# Patient Record
Sex: Female | Born: 1945
Health system: Southern US, Community
[De-identification: ages and names within clinical notes are randomized; demographics above are authoritative.]

## PROBLEM LIST (undated history)

## (undated) DIAGNOSIS — R519 Headache, unspecified: Secondary | ICD-10-CM

## (undated) DIAGNOSIS — I10 Essential (primary) hypertension: Secondary | ICD-10-CM

## (undated) DIAGNOSIS — I8390 Asymptomatic varicose veins of unspecified lower extremity: Secondary | ICD-10-CM

## (undated) DIAGNOSIS — R112 Nausea with vomiting, unspecified: Secondary | ICD-10-CM

## (undated) DIAGNOSIS — R51 Headache: Secondary | ICD-10-CM

## (undated) DIAGNOSIS — R011 Cardiac murmur, unspecified: Secondary | ICD-10-CM

## (undated) DIAGNOSIS — E785 Hyperlipidemia, unspecified: Secondary | ICD-10-CM

## (undated) DIAGNOSIS — Z9889 Other specified postprocedural states: Secondary | ICD-10-CM

## (undated) DIAGNOSIS — K219 Gastro-esophageal reflux disease without esophagitis: Secondary | ICD-10-CM

## (undated) DIAGNOSIS — I739 Peripheral vascular disease, unspecified: Secondary | ICD-10-CM

## (undated) DIAGNOSIS — D649 Anemia, unspecified: Secondary | ICD-10-CM

## (undated) DIAGNOSIS — I350 Nonrheumatic aortic (valve) stenosis: Secondary | ICD-10-CM

## (undated) HISTORY — PX: ADENOIDECTOMY: SUR15

## (undated) HISTORY — PX: TUBAL LIGATION: SHX77

## (undated) HISTORY — DX: Anemia, unspecified: D64.9

## (undated) HISTORY — PX: TONSILLECTOMY: SUR1361

## (undated) HISTORY — PX: CARPAL TUNNEL RELEASE: SHX101

## (undated) HISTORY — PX: COLONOSCOPY: SHX174

## (undated) HISTORY — DX: Asymptomatic varicose veins of unspecified lower extremity: I83.90

## (undated) HISTORY — PX: TARSAL TUNNEL RELEASE: SUR1099

## (undated) HISTORY — PX: COLON RESECTION: SHX5231

---

## 1999-05-26 ENCOUNTER — Emergency Department (HOSPITAL_COMMUNITY): Admission: EM | Admit: 1999-05-26 | Discharge: 1999-05-26 | Payer: Self-pay | Admitting: Emergency Medicine

## 2000-10-28 ENCOUNTER — Encounter: Admission: RE | Admit: 2000-10-28 | Discharge: 2000-10-28 | Payer: Self-pay | Admitting: Family Medicine

## 2000-10-28 ENCOUNTER — Encounter: Payer: Self-pay | Admitting: Family Medicine

## 2002-10-24 ENCOUNTER — Encounter: Payer: Self-pay | Admitting: *Deleted

## 2002-10-26 ENCOUNTER — Ambulatory Visit (HOSPITAL_COMMUNITY): Admission: RE | Admit: 2002-10-26 | Discharge: 2002-10-26 | Payer: Self-pay | Admitting: *Deleted

## 2003-02-16 HISTORY — PX: FEMORAL BYPASS: SHX50

## 2003-03-20 ENCOUNTER — Encounter
Admission: RE | Admit: 2003-03-20 | Discharge: 2003-06-18 | Payer: Self-pay | Admitting: Physical Medicine & Rehabilitation

## 2003-07-25 ENCOUNTER — Ambulatory Visit (HOSPITAL_COMMUNITY): Admission: RE | Admit: 2003-07-25 | Discharge: 2003-07-25 | Payer: Self-pay | Admitting: Orthopedic Surgery

## 2003-07-25 ENCOUNTER — Ambulatory Visit (HOSPITAL_BASED_OUTPATIENT_CLINIC_OR_DEPARTMENT_OTHER): Admission: RE | Admit: 2003-07-25 | Discharge: 2003-07-25 | Payer: Self-pay | Admitting: Orthopedic Surgery

## 2005-02-15 HISTORY — PX: OTHER SURGICAL HISTORY: SHX169

## 2005-02-19 ENCOUNTER — Ambulatory Visit (HOSPITAL_COMMUNITY): Admission: RE | Admit: 2005-02-19 | Discharge: 2005-02-19 | Payer: Self-pay | Admitting: *Deleted

## 2005-03-03 ENCOUNTER — Inpatient Hospital Stay (HOSPITAL_COMMUNITY): Admission: RE | Admit: 2005-03-03 | Discharge: 2005-03-08 | Payer: Self-pay | Admitting: *Deleted

## 2005-04-02 ENCOUNTER — Inpatient Hospital Stay (HOSPITAL_COMMUNITY): Admission: AD | Admit: 2005-04-02 | Discharge: 2005-04-07 | Payer: Self-pay | Admitting: *Deleted

## 2005-06-15 HISTORY — PX: OTHER SURGICAL HISTORY: SHX169

## 2005-06-25 ENCOUNTER — Ambulatory Visit (HOSPITAL_COMMUNITY): Admission: RE | Admit: 2005-06-25 | Discharge: 2005-06-25 | Payer: Self-pay | Admitting: *Deleted

## 2005-06-30 ENCOUNTER — Encounter: Admission: RE | Admit: 2005-06-30 | Discharge: 2005-06-30 | Payer: Self-pay | Admitting: Internal Medicine

## 2005-07-07 ENCOUNTER — Encounter: Payer: Self-pay | Admitting: Vascular Surgery

## 2005-07-07 ENCOUNTER — Inpatient Hospital Stay (HOSPITAL_COMMUNITY): Admission: RE | Admit: 2005-07-07 | Discharge: 2005-07-11 | Payer: Self-pay | Admitting: *Deleted

## 2005-07-09 ENCOUNTER — Encounter (INDEPENDENT_AMBULATORY_CARE_PROVIDER_SITE_OTHER): Payer: Self-pay | Admitting: *Deleted

## 2005-08-23 ENCOUNTER — Ambulatory Visit (HOSPITAL_COMMUNITY): Admission: RE | Admit: 2005-08-23 | Discharge: 2005-08-23 | Payer: Self-pay | Admitting: *Deleted

## 2005-11-15 ENCOUNTER — Emergency Department (HOSPITAL_COMMUNITY): Admission: EM | Admit: 2005-11-15 | Discharge: 2005-11-15 | Payer: Self-pay | Admitting: Family Medicine

## 2005-11-19 ENCOUNTER — Inpatient Hospital Stay (HOSPITAL_COMMUNITY): Admission: AD | Admit: 2005-11-19 | Discharge: 2005-11-27 | Payer: Self-pay | Admitting: Vascular Surgery

## 2005-11-22 ENCOUNTER — Encounter: Payer: Self-pay | Admitting: Vascular Surgery

## 2005-12-31 ENCOUNTER — Encounter: Payer: Self-pay | Admitting: Vascular Surgery

## 2005-12-31 ENCOUNTER — Ambulatory Visit (HOSPITAL_COMMUNITY): Admission: RE | Admit: 2005-12-31 | Discharge: 2005-12-31 | Payer: Self-pay | Admitting: *Deleted

## 2006-04-15 ENCOUNTER — Ambulatory Visit: Payer: Self-pay | Admitting: *Deleted

## 2006-04-15 ENCOUNTER — Ambulatory Visit (HOSPITAL_COMMUNITY): Admission: RE | Admit: 2006-04-15 | Discharge: 2006-04-15 | Payer: Self-pay | Admitting: *Deleted

## 2006-05-20 ENCOUNTER — Ambulatory Visit (HOSPITAL_COMMUNITY): Admission: RE | Admit: 2006-05-20 | Discharge: 2006-05-20 | Payer: Self-pay | Admitting: *Deleted

## 2006-07-29 ENCOUNTER — Ambulatory Visit: Payer: Self-pay | Admitting: Vascular Surgery

## 2006-11-04 ENCOUNTER — Ambulatory Visit (HOSPITAL_COMMUNITY): Admission: RE | Admit: 2006-11-04 | Discharge: 2006-11-04 | Payer: Self-pay | Admitting: *Deleted

## 2006-11-04 ENCOUNTER — Ambulatory Visit: Payer: Self-pay | Admitting: *Deleted

## 2006-12-08 ENCOUNTER — Ambulatory Visit: Payer: Self-pay | Admitting: *Deleted

## 2007-03-17 ENCOUNTER — Ambulatory Visit: Payer: Self-pay | Admitting: *Deleted

## 2007-03-17 ENCOUNTER — Ambulatory Visit (HOSPITAL_COMMUNITY): Admission: RE | Admit: 2007-03-17 | Discharge: 2007-03-17 | Payer: Self-pay

## 2007-04-13 ENCOUNTER — Ambulatory Visit: Payer: Self-pay | Admitting: *Deleted

## 2007-07-13 ENCOUNTER — Ambulatory Visit: Payer: Self-pay | Admitting: *Deleted

## 2007-09-06 ENCOUNTER — Ambulatory Visit: Payer: Self-pay | Admitting: *Deleted

## 2007-09-06 ENCOUNTER — Ambulatory Visit (HOSPITAL_COMMUNITY): Admission: RE | Admit: 2007-09-06 | Discharge: 2007-09-07 | Payer: Self-pay | Admitting: *Deleted

## 2007-11-16 ENCOUNTER — Ambulatory Visit: Payer: Self-pay | Admitting: *Deleted

## 2008-02-16 HISTORY — PX: OTHER SURGICAL HISTORY: SHX169

## 2008-02-28 ENCOUNTER — Ambulatory Visit: Payer: Self-pay | Admitting: *Deleted

## 2008-02-28 ENCOUNTER — Ambulatory Visit (HOSPITAL_COMMUNITY): Admission: RE | Admit: 2008-02-28 | Discharge: 2008-02-28 | Payer: Self-pay | Admitting: *Deleted

## 2008-03-21 ENCOUNTER — Ambulatory Visit: Payer: Self-pay | Admitting: *Deleted

## 2008-04-18 ENCOUNTER — Emergency Department (HOSPITAL_COMMUNITY): Admission: EM | Admit: 2008-04-18 | Discharge: 2008-04-18 | Payer: Self-pay | Admitting: Family Medicine

## 2008-05-13 ENCOUNTER — Inpatient Hospital Stay (HOSPITAL_COMMUNITY): Admission: EM | Admit: 2008-05-13 | Discharge: 2008-05-18 | Payer: Self-pay | Admitting: Emergency Medicine

## 2008-05-18 ENCOUNTER — Encounter (INDEPENDENT_AMBULATORY_CARE_PROVIDER_SITE_OTHER): Payer: Self-pay | Admitting: Surgery

## 2008-05-18 ENCOUNTER — Ambulatory Visit: Payer: Self-pay | Admitting: Vascular Surgery

## 2008-05-22 ENCOUNTER — Ambulatory Visit (HOSPITAL_COMMUNITY): Admission: RE | Admit: 2008-05-22 | Discharge: 2008-05-22 | Payer: Self-pay | Admitting: General Surgery

## 2008-05-23 ENCOUNTER — Ambulatory Visit (HOSPITAL_COMMUNITY): Admission: RE | Admit: 2008-05-23 | Discharge: 2008-05-23 | Payer: Self-pay | Admitting: General Surgery

## 2008-05-30 ENCOUNTER — Ambulatory Visit (HOSPITAL_COMMUNITY): Admission: RE | Admit: 2008-05-30 | Discharge: 2008-05-30 | Payer: Self-pay | Admitting: General Surgery

## 2008-06-10 ENCOUNTER — Ambulatory Visit (HOSPITAL_COMMUNITY): Admission: RE | Admit: 2008-06-10 | Discharge: 2008-06-10 | Payer: Self-pay | Admitting: General Surgery

## 2008-06-18 ENCOUNTER — Ambulatory Visit (HOSPITAL_COMMUNITY): Admission: RE | Admit: 2008-06-18 | Discharge: 2008-06-18 | Payer: Self-pay | Admitting: General Surgery

## 2008-06-26 ENCOUNTER — Ambulatory Visit (HOSPITAL_COMMUNITY): Admission: RE | Admit: 2008-06-26 | Discharge: 2008-06-26 | Payer: Self-pay | Admitting: General Surgery

## 2008-06-28 ENCOUNTER — Ambulatory Visit (HOSPITAL_COMMUNITY): Admission: RE | Admit: 2008-06-28 | Discharge: 2008-06-28 | Payer: Self-pay | Admitting: General Surgery

## 2008-07-17 ENCOUNTER — Inpatient Hospital Stay (HOSPITAL_COMMUNITY): Admission: EM | Admit: 2008-07-17 | Discharge: 2008-07-25 | Payer: Self-pay | Admitting: Emergency Medicine

## 2008-08-01 ENCOUNTER — Ambulatory Visit: Payer: Self-pay | Admitting: *Deleted

## 2008-08-07 ENCOUNTER — Ambulatory Visit: Payer: Self-pay | Admitting: *Deleted

## 2008-08-07 ENCOUNTER — Inpatient Hospital Stay (HOSPITAL_COMMUNITY): Admission: RE | Admit: 2008-08-07 | Discharge: 2008-08-16 | Payer: Self-pay | Admitting: *Deleted

## 2008-08-12 ENCOUNTER — Encounter (INDEPENDENT_AMBULATORY_CARE_PROVIDER_SITE_OTHER): Payer: Self-pay | Admitting: *Deleted

## 2008-08-23 ENCOUNTER — Ambulatory Visit (HOSPITAL_COMMUNITY): Admission: RE | Admit: 2008-08-23 | Discharge: 2008-08-23 | Payer: Self-pay | Admitting: Surgery

## 2008-08-29 ENCOUNTER — Ambulatory Visit: Payer: Self-pay | Admitting: *Deleted

## 2008-09-05 ENCOUNTER — Ambulatory Visit: Payer: Self-pay | Admitting: *Deleted

## 2008-09-17 ENCOUNTER — Inpatient Hospital Stay (HOSPITAL_COMMUNITY): Admission: RE | Admit: 2008-09-17 | Discharge: 2008-09-23 | Payer: Self-pay | Admitting: General Surgery

## 2008-09-17 ENCOUNTER — Encounter (INDEPENDENT_AMBULATORY_CARE_PROVIDER_SITE_OTHER): Payer: Self-pay | Admitting: General Surgery

## 2008-10-14 ENCOUNTER — Ambulatory Visit: Payer: Self-pay | Admitting: Surgery

## 2008-11-11 ENCOUNTER — Ambulatory Visit: Payer: Self-pay | Admitting: Surgery

## 2008-12-20 ENCOUNTER — Ambulatory Visit: Payer: Self-pay | Admitting: Vascular Surgery

## 2009-06-23 ENCOUNTER — Ambulatory Visit: Payer: Self-pay | Admitting: Surgery

## 2009-12-24 ENCOUNTER — Ambulatory Visit: Payer: Self-pay | Admitting: Surgery

## 2009-12-27 IMAGING — CT CT PELVIS W/ CM
2 of 5 series · 16 of 46 positions shown, 18 images · IV contrast (agent unspecified)
Comparison: 05/22/2008

CT ABDOMEN

CLINICAL DATA: Status post diverticulitis.  Follow up abscess

CT ABDOMEN AND PELVIS WITH CONTRAST
TECHNIQUE: Multidetector CT imaging of the abdomen and pelvis was
performed using the standard protocol following bolus
administration of intravenous contrast.
Contrast: 180 ml of omni 300

[Series 2: routine abdomen · axial · 0.89mm/px · z∈[-466,-66]mm · 13 of 90 slices shown, 15 images]
[im 5/90  soft-tissue]
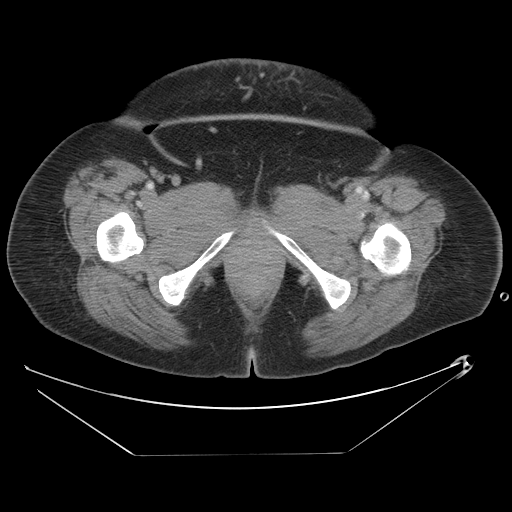
[im 5/90  bone]
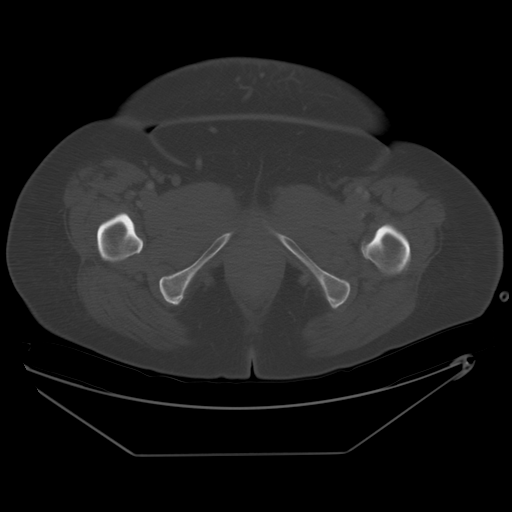
[im 14/90  soft-tissue]
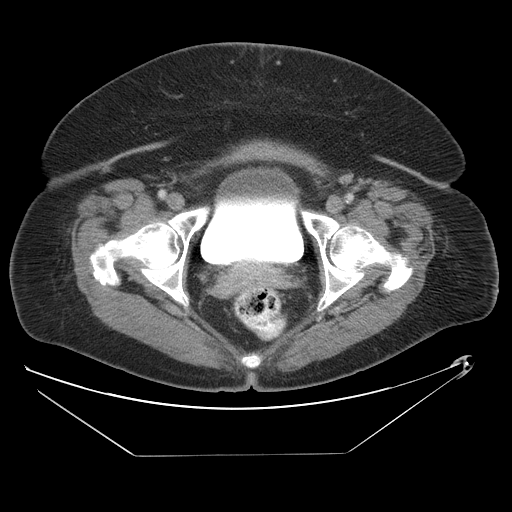
[im 18/90  soft-tissue]
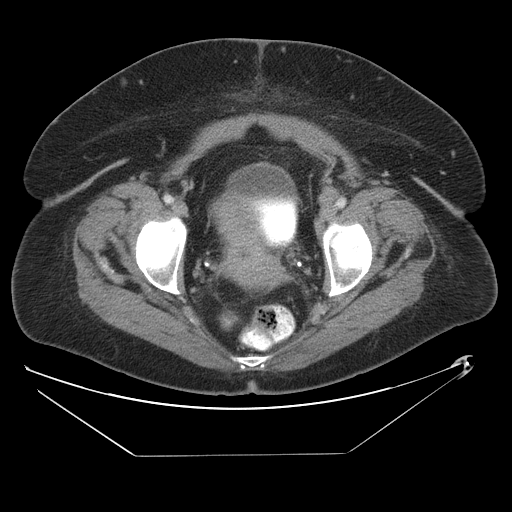
[im 27/90  soft-tissue]
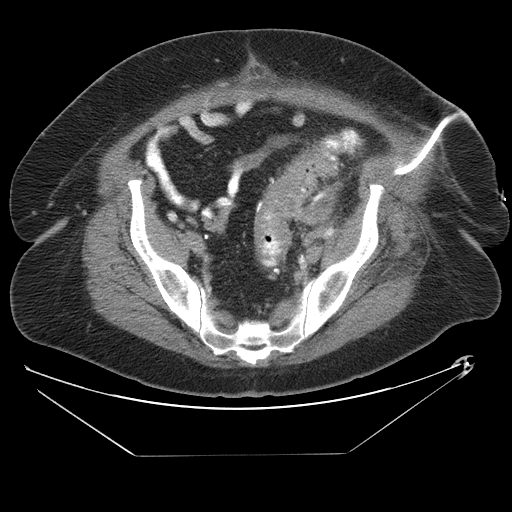
[im 32/90  soft-tissue]
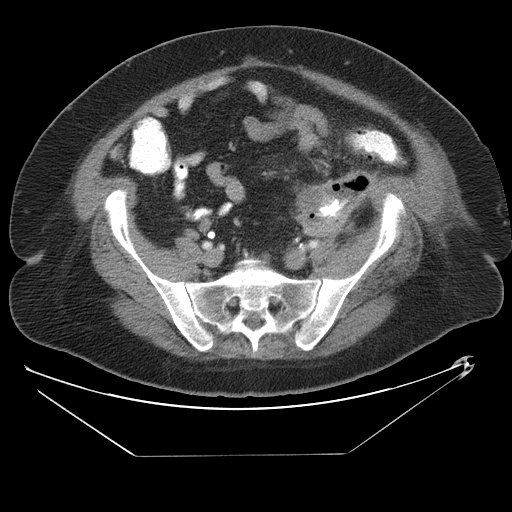
[im 41/90  soft-tissue]
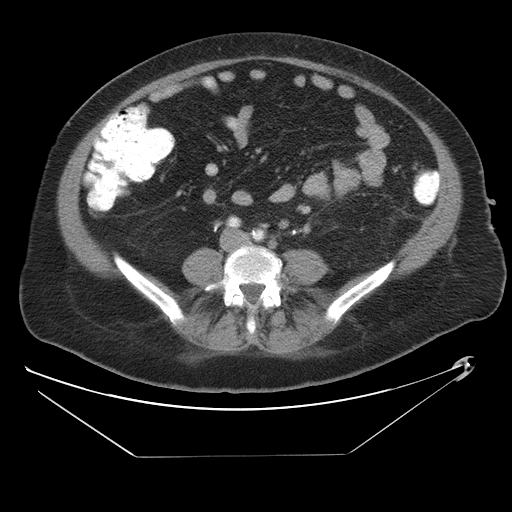
[im 45/90  soft-tissue]
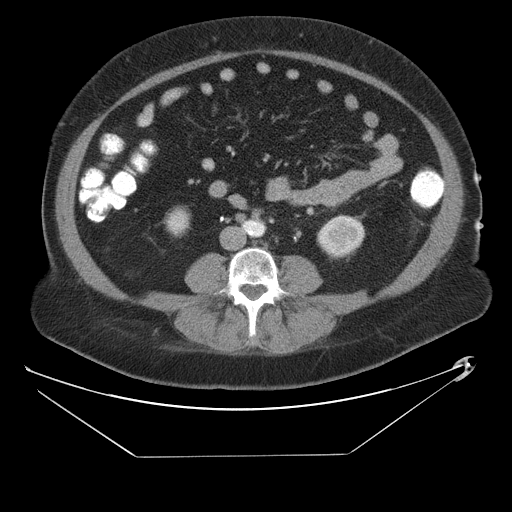
[im 49/90  soft-tissue]
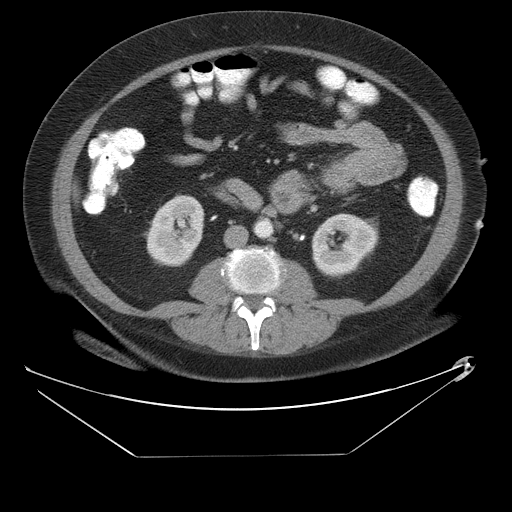
[im 58/90  soft-tissue]
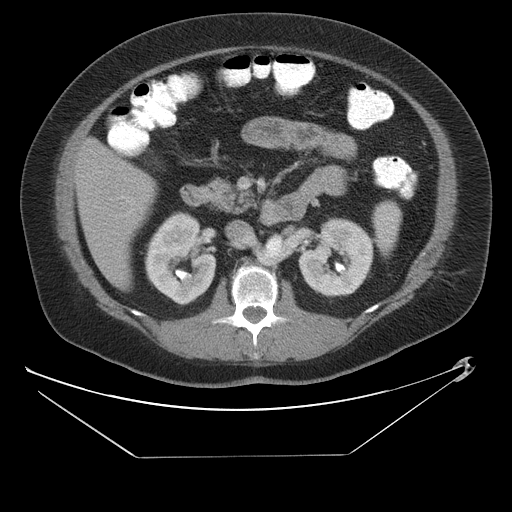
[im 58/90  bone]
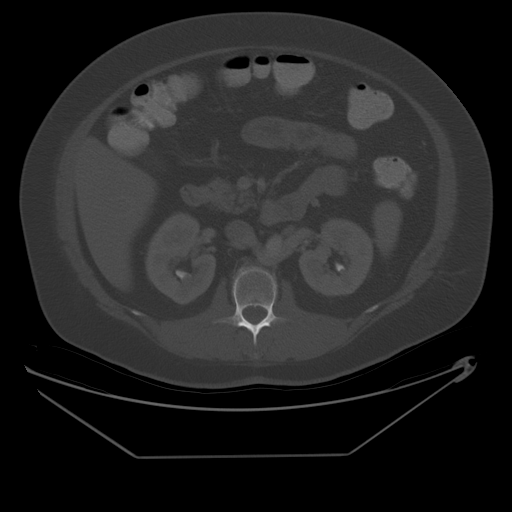
[im 63/90  soft-tissue]
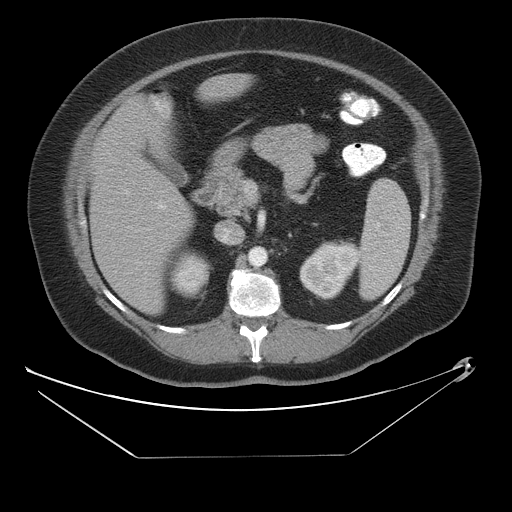
[im 72/90  soft-tissue]
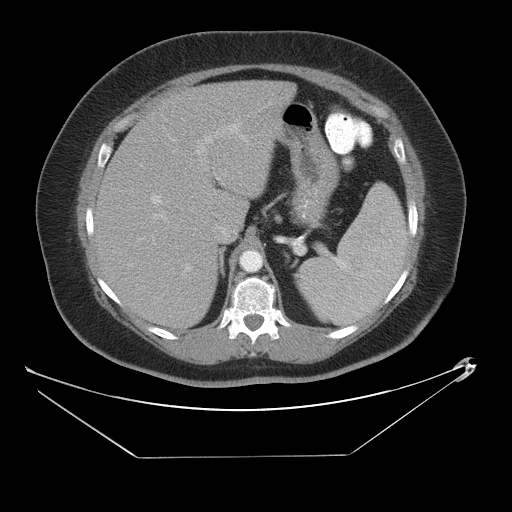
[im 76/90  soft-tissue]
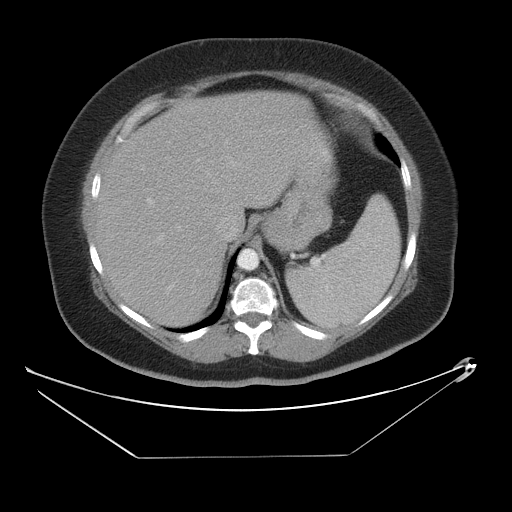
[im 85/90  soft-tissue]
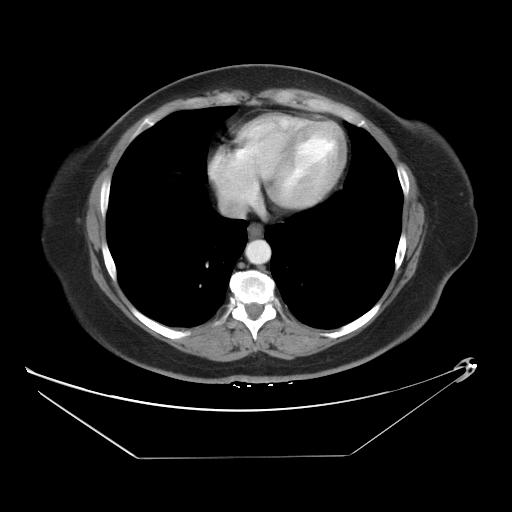

[Series 401: coronals · coronal · 0.89mm/px · 3 of 107 slices shown]
[im 36/107  soft-tissue]
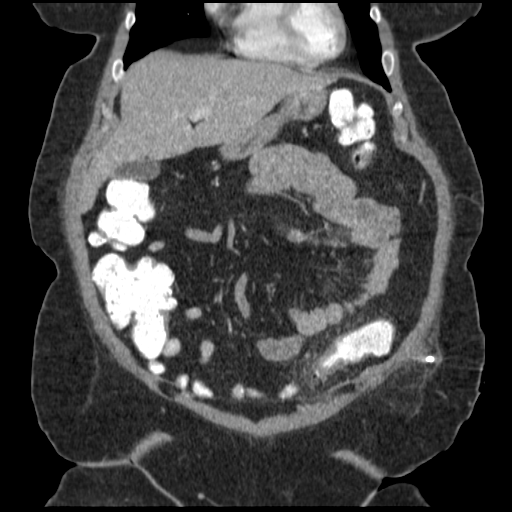
[im 48/107  soft-tissue]
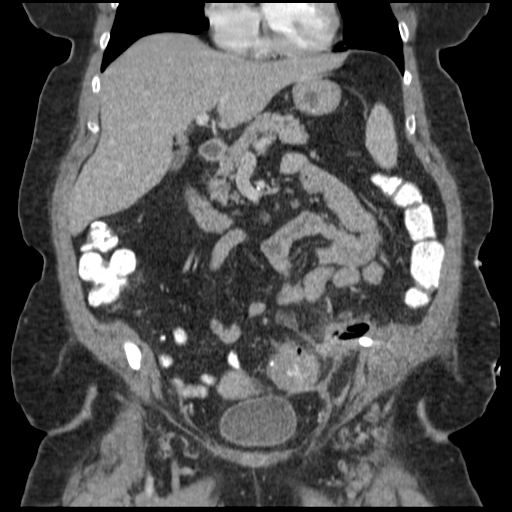
[im 59/107  soft-tissue]
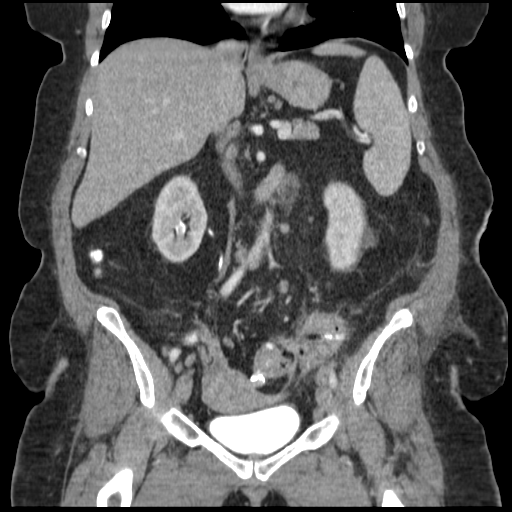

[16 of 46 positions shown; findings below may reference images not displayed]

FINDINGS: The lung bases are clear.

The adrenal glands are negative.

The spleen is negative.

Gallbladder is negative.  There is no biliary dilatation.

The pancreas is normal.

There is mild fatty infiltration of the liver.

No focal liver abnormalities noted.

The right kidney is normal.  The left kidney is normal.  There is a
retroaortic left renal vein.

Scattered borderline enlarged retroperitoneal lymph nodes are
noted.  In the setting of abscess and acute diverticulitis these
may be reactive.  For example periaortic lymph node on image number
44 measures 1 cm.

There are no free fluid or abnormal fluid collections within the
upper abdomen.

There are no specific features to suggest bowel obstruction.

 The small bowel loops are normal in their course and in their
caliber.

Review of the visualized osseous structures shows mild multilevel
spondylosis.
IMPRESSION: 1.  No acute upper abdominal CT findings.

2.  Fatty infiltration of the liver.
3.  Borderline enlarged retroperitoneal lymph nodes may be reactive
in etiology.

CT PELVIS
FINDINGS: Diverticular changes and acute diverticulitis is again
noted involving the sigmoid colon.

The abscess within the left iliac fossa is again noted.  The
pigtail drainage catheter has been repositioned and is now in the
central portion of the abscess.  This currently measures 4.8 x
cm.  Previously this measured 6.1 x 7.4   cm.

There are no new fluid collections.

The uterus appears normal.

The urinary bladder is negative.

Borderline enlarged, enlarged common iliac lymph node is again
noted.  This currently measures 1.4 cm, image 48.  This is compared
with the same previously.

There is a left external iliac lymph node which measures 1.6 cm,
image 72.  This is unchanged from previous exam.
IMPRESSION: 1.  Mild decrease in the volume of left iliac fossa diverticular
abscess.
2.  Enlarged iliac lymph nodes may be reactive in etiology.

## 2010-03-08 ENCOUNTER — Encounter: Payer: Self-pay | Admitting: General Surgery

## 2010-03-09 ENCOUNTER — Encounter: Payer: Self-pay | Admitting: General Surgery

## 2010-05-23 LAB — CROSSMATCH
ABO/RH(D): O NEG
Antibody Screen: NEGATIVE

## 2010-05-23 LAB — GLUCOSE, CAPILLARY
Glucose-Capillary: 100 mg/dL — ABNORMAL HIGH (ref 70–99)
Glucose-Capillary: 103 mg/dL — ABNORMAL HIGH (ref 70–99)
Glucose-Capillary: 103 mg/dL — ABNORMAL HIGH (ref 70–99)
Glucose-Capillary: 105 mg/dL — ABNORMAL HIGH (ref 70–99)
Glucose-Capillary: 109 mg/dL — ABNORMAL HIGH (ref 70–99)
Glucose-Capillary: 112 mg/dL — ABNORMAL HIGH (ref 70–99)
Glucose-Capillary: 112 mg/dL — ABNORMAL HIGH (ref 70–99)
Glucose-Capillary: 115 mg/dL — ABNORMAL HIGH (ref 70–99)
Glucose-Capillary: 117 mg/dL — ABNORMAL HIGH (ref 70–99)
Glucose-Capillary: 120 mg/dL — ABNORMAL HIGH (ref 70–99)
Glucose-Capillary: 121 mg/dL — ABNORMAL HIGH (ref 70–99)
Glucose-Capillary: 130 mg/dL — ABNORMAL HIGH (ref 70–99)
Glucose-Capillary: 133 mg/dL — ABNORMAL HIGH (ref 70–99)
Glucose-Capillary: 152 mg/dL — ABNORMAL HIGH (ref 70–99)
Glucose-Capillary: 183 mg/dL — ABNORMAL HIGH (ref 70–99)

## 2010-05-23 LAB — BASIC METABOLIC PANEL
Calcium: 8.4 mg/dL (ref 8.4–10.5)
GFR calc Af Amer: >60 mL/min (ref >60)
GFR calc non Af Amer: >60 mL/min (ref >60)
Potassium: 4.4 mEq/L (ref 3.5–5.1)
Sodium: 140 mEq/L (ref 135–145)

## 2010-05-23 LAB — CBC
HCT: 21.3 % — ABNORMAL LOW (ref 36.0–46.0)
HCT: 27.1 % — ABNORMAL LOW (ref 36.0–46.0)
Hemoglobin: 7 g/dL — CL (ref 12.0–15.0)
Hemoglobin: 8.1 g/dL — ABNORMAL LOW (ref 12.0–15.0)
MCHC: 32.6 g/dL (ref 30.0–36.0)
MCHC: 33 g/dL (ref 30.0–36.0)
MCV: 81.8 fL (ref 78.0–100.0)
MCV: 82.4 fL (ref 78.0–100.0)
MCV: 82.7 fL (ref 78.0–100.0)
Platelets: 133 10*3/uL — ABNORMAL LOW (ref 150–400)
RBC: 2.6 MIL/uL — ABNORMAL LOW (ref 3.87–5.11)
RBC: 2.85 MIL/uL — ABNORMAL LOW (ref 3.87–5.11)
RBC: 2.97 MIL/uL — ABNORMAL LOW (ref 3.87–5.11)
RBC: 3.29 MIL/uL — ABNORMAL LOW (ref 3.87–5.11)
RDW: 15.1 % (ref 11.5–15.5)
RDW: 16.1 % — ABNORMAL HIGH (ref 11.5–15.5)
WBC: 4.3 10*3/uL (ref 4.0–10.5)
WBC: 4.5 10*3/uL (ref 4.0–10.5)
WBC: 4.9 10*3/uL (ref 4.0–10.5)

## 2010-05-23 LAB — SAMPLE TO BLOOD BANK

## 2010-05-23 LAB — ABO/RH: ABO/RH(D): O NEG

## 2010-05-24 LAB — URINALYSIS, ROUTINE W REFLEX MICROSCOPIC
Nitrite: NEGATIVE
Protein, ur: NEGATIVE mg/dL
pH: 5 (ref 5.0–8.0)

## 2010-05-24 LAB — COMPREHENSIVE METABOLIC PANEL
AST: 17 U/L (ref 0–37)
BUN: 17 mg/dL (ref 6–23)
CO2: 30 mEq/L (ref 19–32)
Calcium: 9.1 mg/dL (ref 8.4–10.5)
Chloride: 100 mEq/L (ref 96–112)
Creatinine, Ser: 1.27 mg/dL — ABNORMAL HIGH (ref 0.4–1.2)
GFR calc non Af Amer: 43 mL/min — ABNORMAL LOW (ref >60)
Glucose, Bld: 143 mg/dL — ABNORMAL HIGH (ref 70–99)
Total Bilirubin: 0.6 mg/dL (ref 0.3–1.2)

## 2010-05-24 LAB — CBC
HCT: 31.4 % — ABNORMAL LOW (ref 36.0–46.0)
Hemoglobin: 10.2 g/dL — ABNORMAL LOW (ref 12.0–15.0)
MCHC: 32.5 g/dL (ref 30.0–36.0)
MCV: 81.7 fL (ref 78.0–100.0)
RBC: 3.85 MIL/uL — ABNORMAL LOW (ref 3.87–5.11)
WBC: 4.4 10*3/uL (ref 4.0–10.5)

## 2010-05-24 LAB — DIFFERENTIAL
Basophils Absolute: 0 10*3/uL (ref 0.0–0.1)
Eosinophils Relative: 2 % (ref 0–5)
Lymphocytes Relative: 34 % (ref 12–46)
Neutro Abs: 2.5 10*3/uL (ref 1.7–7.7)
Neutrophils Relative %: 57 % (ref 43–77)

## 2010-05-24 LAB — URINE MICROSCOPIC-ADD ON

## 2010-05-24 LAB — GLUCOSE, CAPILLARY
Glucose-Capillary: 107 mg/dL — ABNORMAL HIGH (ref 70–99)
Glucose-Capillary: 117 mg/dL — ABNORMAL HIGH (ref 70–99)
Glucose-Capillary: 122 mg/dL — ABNORMAL HIGH (ref 70–99)
Glucose-Capillary: 99 mg/dL (ref 70–99)

## 2010-05-25 LAB — CBC
HCT: 24.5 % — ABNORMAL LOW (ref 36.0–46.0)
HCT: 26.8 % — ABNORMAL LOW (ref 36.0–46.0)
HCT: 29.3 % — ABNORMAL LOW (ref 36.0–46.0)
HCT: 30.6 % — ABNORMAL LOW (ref 36.0–46.0)
HCT: 30.6 % — ABNORMAL LOW (ref 36.0–46.0)
HCT: 26.6 % — ABNORMAL LOW (ref 36.0–46.0)
HCT: 27 % — ABNORMAL LOW (ref 36.0–46.0)
HCT: 29.4 % — ABNORMAL LOW (ref 36.0–46.0)
Hemoglobin: 8.1 g/dL — ABNORMAL LOW (ref 12.0–15.0)
Hemoglobin: 8.7 g/dL — ABNORMAL LOW (ref 12.0–15.0)
Hemoglobin: 8.9 g/dL — ABNORMAL LOW (ref 12.0–15.0)
Hemoglobin: 9 g/dL — ABNORMAL LOW (ref 12.0–15.0)
Hemoglobin: 9.8 g/dL — ABNORMAL LOW (ref 12.0–15.0)
MCHC: 32.7 g/dL (ref 30.0–36.0)
MCHC: 33.1 g/dL (ref 30.0–36.0)
MCHC: 33.1 g/dL (ref 30.0–36.0)
MCHC: 33.1 g/dL (ref 30.0–36.0)
MCHC: 32.7 g/dL (ref 30.0–36.0)
MCHC: 33.3 g/dL (ref 30.0–36.0)
MCHC: 33.3 g/dL (ref 30.0–36.0)
MCHC: 33.6 g/dL (ref 30.0–36.0)
MCV: 81.8 fL (ref 78.0–100.0)
MCV: 81.9 fL (ref 78.0–100.0)
MCV: 81.9 fL (ref 78.0–100.0)
MCV: 82.3 fL (ref 78.0–100.0)
MCV: 81.1 fL (ref 78.0–100.0)
MCV: 82 fL (ref 78.0–100.0)
MCV: 82 fL (ref 78.0–100.0)
MCV: 82.3 fL (ref 78.0–100.0)
Platelets: 221 10*3/uL (ref 150–400)
Platelets: 228 10*3/uL (ref 150–400)
Platelets: 249 10*3/uL (ref 150–400)
Platelets: 141 10*3/uL — ABNORMAL LOW (ref 150–400)
Platelets: 159 10*3/uL (ref 150–400)
Platelets: 178 10*3/uL (ref 150–400)
Platelets: 182 10*3/uL (ref 150–400)
RBC: 3.25 MIL/uL — ABNORMAL LOW (ref 3.87–5.11)
RBC: 3.26 MIL/uL — ABNORMAL LOW (ref 3.87–5.11)
RBC: 3.73 MIL/uL — ABNORMAL LOW (ref 3.87–5.11)
RBC: 3.21 MIL/uL — ABNORMAL LOW (ref 3.87–5.11)
RBC: 3.65 MIL/uL — ABNORMAL LOW (ref 3.87–5.11)
RBC: 3.74 MIL/uL — ABNORMAL LOW (ref 3.87–5.11)
RDW: 15.4 % (ref 11.5–15.5)
RDW: 15.6 % — ABNORMAL HIGH (ref 11.5–15.5)
RDW: 16.1 % — ABNORMAL HIGH (ref 11.5–15.5)
RDW: 15.7 % — ABNORMAL HIGH (ref 11.5–15.5)
RDW: 15.8 % — ABNORMAL HIGH (ref 11.5–15.5)
RDW: 15.9 % — ABNORMAL HIGH (ref 11.5–15.5)
RDW: 16.1 % — ABNORMAL HIGH (ref 11.5–15.5)
RDW: 16.3 % — ABNORMAL HIGH (ref 11.5–15.5)
RDW: 16.5 % — ABNORMAL HIGH (ref 11.5–15.5)
WBC: 6.7 10*3/uL (ref 4.0–10.5)
WBC: 8 10*3/uL (ref 4.0–10.5)
WBC: 5.2 10*3/uL (ref 4.0–10.5)
WBC: 5.4 10*3/uL (ref 4.0–10.5)

## 2010-05-25 LAB — BASIC METABOLIC PANEL
BUN: 6 mg/dL (ref 6–23)
BUN: 7 mg/dL (ref 6–23)
BUN: 8 mg/dL (ref 6–23)
BUN: 4 mg/dL — ABNORMAL LOW (ref 6–23)
BUN: 5 mg/dL — ABNORMAL LOW (ref 6–23)
BUN: 8 mg/dL (ref 6–23)
CO2: 29 mEq/L (ref 19–32)
CO2: 31 mEq/L (ref 19–32)
CO2: 31 mEq/L (ref 19–32)
CO2: 31 mEq/L (ref 19–32)
Calcium: 8.5 mg/dL (ref 8.4–10.5)
Calcium: 8.8 mg/dL (ref 8.4–10.5)
Calcium: 9 mg/dL (ref 8.4–10.5)
Calcium: 8.7 mg/dL (ref 8.4–10.5)
Calcium: 8.7 mg/dL (ref 8.4–10.5)
Chloride: 101 mEq/L (ref 96–112)
Chloride: 103 mEq/L (ref 96–112)
Chloride: 104 mEq/L (ref 96–112)
Chloride: 105 mEq/L (ref 96–112)
Chloride: 110 mEq/L (ref 96–112)
Creatinine, Ser: 0.69 mg/dL (ref 0.4–1.2)
Creatinine, Ser: 0.7 mg/dL (ref 0.4–1.2)
Creatinine, Ser: 0.91 mg/dL (ref 0.4–1.2)
Creatinine, Ser: 0.68 mg/dL (ref 0.4–1.2)
Creatinine, Ser: 0.74 mg/dL (ref 0.4–1.2)
Creatinine, Ser: 0.89 mg/dL (ref 0.4–1.2)
GFR calc Af Amer: >60 mL/min (ref >60)
GFR calc Af Amer: >60 mL/min (ref >60)
GFR calc Af Amer: >60 mL/min (ref >60)
GFR calc non Af Amer: >60 mL/min (ref >60)
GFR calc non Af Amer: >60 mL/min (ref >60)
GFR calc non Af Amer: >60 mL/min (ref >60)
GFR calc non Af Amer: >60 mL/min (ref >60)
GFR calc non Af Amer: >60 mL/min (ref >60)
Glucose, Bld: 120 mg/dL — ABNORMAL HIGH (ref 70–99)
Glucose, Bld: 152 mg/dL — ABNORMAL HIGH (ref 70–99)
Glucose, Bld: 163 mg/dL — ABNORMAL HIGH (ref 70–99)
Glucose, Bld: 167 mg/dL — ABNORMAL HIGH (ref 70–99)
Glucose, Bld: 118 mg/dL — ABNORMAL HIGH (ref 70–99)
Glucose, Bld: 145 mg/dL — ABNORMAL HIGH (ref 70–99)
Glucose, Bld: 146 mg/dL — ABNORMAL HIGH (ref 70–99)
Glucose, Bld: 153 mg/dL — ABNORMAL HIGH (ref 70–99)
Potassium: 3.5 mEq/L (ref 3.5–5.1)
Potassium: 3.6 mEq/L (ref 3.5–5.1)
Potassium: 3 mEq/L — ABNORMAL LOW (ref 3.5–5.1)
Potassium: 4 mEq/L (ref 3.5–5.1)
Sodium: 136 mEq/L (ref 135–145)
Sodium: 138 mEq/L (ref 135–145)
Sodium: 143 mEq/L (ref 135–145)

## 2010-05-25 LAB — GLUCOSE, CAPILLARY
Glucose-Capillary: 101 mg/dL — ABNORMAL HIGH (ref 70–99)
Glucose-Capillary: 104 mg/dL — ABNORMAL HIGH (ref 70–99)
Glucose-Capillary: 107 mg/dL — ABNORMAL HIGH (ref 70–99)
Glucose-Capillary: 108 mg/dL — ABNORMAL HIGH (ref 70–99)
Glucose-Capillary: 108 mg/dL — ABNORMAL HIGH (ref 70–99)
Glucose-Capillary: 110 mg/dL — ABNORMAL HIGH (ref 70–99)
Glucose-Capillary: 111 mg/dL — ABNORMAL HIGH (ref 70–99)
Glucose-Capillary: 114 mg/dL — ABNORMAL HIGH (ref 70–99)
Glucose-Capillary: 117 mg/dL — ABNORMAL HIGH (ref 70–99)
Glucose-Capillary: 118 mg/dL — ABNORMAL HIGH (ref 70–99)
Glucose-Capillary: 121 mg/dL — ABNORMAL HIGH (ref 70–99)
Glucose-Capillary: 128 mg/dL — ABNORMAL HIGH (ref 70–99)
Glucose-Capillary: 129 mg/dL — ABNORMAL HIGH (ref 70–99)
Glucose-Capillary: 131 mg/dL — ABNORMAL HIGH (ref 70–99)
Glucose-Capillary: 149 mg/dL — ABNORMAL HIGH (ref 70–99)
Glucose-Capillary: 117 mg/dL — ABNORMAL HIGH (ref 70–99)
Glucose-Capillary: 118 mg/dL — ABNORMAL HIGH (ref 70–99)
Glucose-Capillary: 119 mg/dL — ABNORMAL HIGH (ref 70–99)
Glucose-Capillary: 121 mg/dL — ABNORMAL HIGH (ref 70–99)
Glucose-Capillary: 134 mg/dL — ABNORMAL HIGH (ref 70–99)
Glucose-Capillary: 140 mg/dL — ABNORMAL HIGH (ref 70–99)
Glucose-Capillary: 141 mg/dL — ABNORMAL HIGH (ref 70–99)
Glucose-Capillary: 158 mg/dL — ABNORMAL HIGH (ref 70–99)
Glucose-Capillary: 160 mg/dL — ABNORMAL HIGH (ref 70–99)
Glucose-Capillary: 161 mg/dL — ABNORMAL HIGH (ref 70–99)
Glucose-Capillary: 214 mg/dL — ABNORMAL HIGH (ref 70–99)

## 2010-05-25 LAB — URINALYSIS, ROUTINE W REFLEX MICROSCOPIC
Bilirubin Urine: NEGATIVE
Glucose, UA: NEGATIVE mg/dL
Ketones, ur: NEGATIVE mg/dL
pH: 5.5 (ref 5.0–8.0)

## 2010-05-25 LAB — ANAEROBIC CULTURE

## 2010-05-25 LAB — CROSSMATCH
ABO/RH(D): O NEG
Antibody Screen: NEGATIVE
Antibody Screen: NEGATIVE

## 2010-05-25 LAB — CULTURE, BLOOD (ROUTINE X 2): Culture: NO GROWTH

## 2010-05-25 LAB — PREPARE PLATELETS

## 2010-05-25 LAB — DIFFERENTIAL
Basophils Absolute: 0 10*3/uL (ref 0.0–0.1)
Basophils Absolute: 0 10*3/uL (ref 0.0–0.1)
Basophils Relative: 0 % (ref 0–1)
Eosinophils Absolute: 0 10*3/uL (ref 0.0–0.7)
Lymphocytes Relative: 16 % (ref 12–46)
Monocytes Absolute: 0.8 10*3/uL (ref 0.1–1.0)
Neutro Abs: 6.1 10*3/uL (ref 1.7–7.7)
Neutrophils Relative %: 74 % (ref 43–77)
Neutrophils Relative %: 82 % — ABNORMAL HIGH (ref 43–77)

## 2010-05-25 LAB — POCT I-STAT, CHEM 8
Creatinine, Ser: 1 mg/dL (ref 0.4–1.2)
Glucose, Bld: 133 mg/dL — ABNORMAL HIGH (ref 70–99)
Hemoglobin: 10.5 g/dL — ABNORMAL LOW (ref 12.0–15.0)
Sodium: 134 mEq/L — ABNORMAL LOW (ref 135–145)
TCO2: 27 mmol/L (ref 0–100)

## 2010-05-25 LAB — HEMOGLOBIN AND HEMATOCRIT, BLOOD: Hemoglobin: 10.1 g/dL — ABNORMAL LOW (ref 12.0–15.0)

## 2010-05-25 LAB — CULTURE, ROUTINE-ABSCESS

## 2010-05-25 LAB — COMPREHENSIVE METABOLIC PANEL
ALT: 26 U/L (ref 0–35)
Alkaline Phosphatase: 126 U/L — ABNORMAL HIGH (ref 39–117)
CO2: 27 mEq/L (ref 19–32)
Calcium: 8.9 mg/dL (ref 8.4–10.5)
GFR calc non Af Amer: >60 mL/min (ref >60)
Glucose, Bld: 138 mg/dL — ABNORMAL HIGH (ref 70–99)
Potassium: 3.3 mEq/L — ABNORMAL LOW (ref 3.5–5.1)
Sodium: 135 mEq/L (ref 135–145)

## 2010-05-25 LAB — URINE MICROSCOPIC-ADD ON

## 2010-05-25 LAB — HEPARIN LEVEL (UNFRACTIONATED): Heparin Unfractionated: <0.1 IU/mL — ABNORMAL LOW (ref 0.30–0.70)

## 2010-05-25 LAB — PROTIME-INR: INR: 1.1 (ref 0.00–1.49)

## 2010-05-25 LAB — MAGNESIUM: Magnesium: 2 mg/dL (ref 1.5–2.5)

## 2010-05-27 LAB — BASIC METABOLIC PANEL
BUN: 8 mg/dL (ref 6–23)
CO2: 24 mEq/L (ref 19–32)
CO2: 33 mEq/L — ABNORMAL HIGH (ref 19–32)
Chloride: 101 mEq/L (ref 96–112)
Chloride: 105 mEq/L (ref 96–112)
Creatinine, Ser: 0.67 mg/dL (ref 0.4–1.2)
GFR calc Af Amer: >60 mL/min (ref >60)
GFR calc non Af Amer: >60 mL/min (ref >60)
Glucose, Bld: 116 mg/dL — ABNORMAL HIGH (ref 70–99)
Glucose, Bld: 128 mg/dL — ABNORMAL HIGH (ref 70–99)
Potassium: 3.5 mEq/L (ref 3.5–5.1)
Potassium: 3.6 mEq/L (ref 3.5–5.1)
Sodium: 140 mEq/L (ref 135–145)

## 2010-05-27 LAB — GLUCOSE, CAPILLARY
Glucose-Capillary: 110 mg/dL — ABNORMAL HIGH (ref 70–99)
Glucose-Capillary: 127 mg/dL — ABNORMAL HIGH (ref 70–99)
Glucose-Capillary: 130 mg/dL — ABNORMAL HIGH (ref 70–99)
Glucose-Capillary: 136 mg/dL — ABNORMAL HIGH (ref 70–99)
Glucose-Capillary: 172 mg/dL — ABNORMAL HIGH (ref 70–99)

## 2010-05-27 LAB — CBC
HCT: 29.1 % — ABNORMAL LOW (ref 36.0–46.0)
HCT: 33 % — ABNORMAL LOW (ref 36.0–46.0)
Hemoglobin: 11.1 g/dL — ABNORMAL LOW (ref 12.0–15.0)
MCHC: 33.5 g/dL (ref 30.0–36.0)
MCV: 79 fL (ref 78.0–100.0)
MCV: 79.7 fL (ref 78.0–100.0)
Platelets: 145 10*3/uL — ABNORMAL LOW (ref 150–400)
RBC: 4.18 MIL/uL (ref 3.87–5.11)
RDW: 15.8 % — ABNORMAL HIGH (ref 11.5–15.5)

## 2010-05-27 LAB — PROTIME-INR: Prothrombin Time: 13.5 seconds (ref 11.6–15.2)

## 2010-05-28 LAB — BASIC METABOLIC PANEL
BUN: 19 mg/dL (ref 6–23)
BUN: 20 mg/dL (ref 6–23)
BUN: 22 mg/dL (ref 6–23)
BUN: 23 mg/dL (ref 6–23)
CO2: 21 mEq/L (ref 19–32)
CO2: 24 mEq/L (ref 19–32)
CO2: 24 mEq/L (ref 19–32)
Calcium: 7.8 mg/dL — ABNORMAL LOW (ref 8.4–10.5)
Calcium: 7.8 mg/dL — ABNORMAL LOW (ref 8.4–10.5)
Calcium: 7.9 mg/dL — ABNORMAL LOW (ref 8.4–10.5)
Chloride: 102 mEq/L (ref 96–112)
Chloride: 108 mEq/L (ref 96–112)
Chloride: 109 mEq/L (ref 96–112)
Creatinine, Ser: 1.2 mg/dL (ref 0.4–1.2)
Creatinine, Ser: 1.31 mg/dL — ABNORMAL HIGH (ref 0.4–1.2)
Creatinine, Ser: 1.44 mg/dL — ABNORMAL HIGH (ref 0.4–1.2)
GFR calc non Af Amer: 46 mL/min — ABNORMAL LOW (ref >60)
GFR calc non Af Amer: 46 mL/min — ABNORMAL LOW (ref >60)
Glucose, Bld: 109 mg/dL — ABNORMAL HIGH (ref 70–99)
Glucose, Bld: 118 mg/dL — ABNORMAL HIGH (ref 70–99)
Glucose, Bld: 120 mg/dL — ABNORMAL HIGH (ref 70–99)
Potassium: 2.8 mEq/L — ABNORMAL LOW (ref 3.5–5.1)
Potassium: 3.2 mEq/L — ABNORMAL LOW (ref 3.5–5.1)
Sodium: 138 mEq/L (ref 135–145)
Sodium: 140 mEq/L (ref 135–145)

## 2010-05-28 LAB — COMPREHENSIVE METABOLIC PANEL
ALT: 19 U/L (ref 0–35)
ALT: 14 U/L (ref 0–35)
AST: 20 U/L (ref 0–37)
AST: 27 U/L (ref 0–37)
Alkaline Phosphatase: 99 U/L (ref 39–117)
CO2: 29 mEq/L (ref 19–32)
Calcium: 8.8 mg/dL (ref 8.4–10.5)
Chloride: 100 mEq/L (ref 96–112)
Creatinine, Ser: 1.3 mg/dL — ABNORMAL HIGH (ref 0.4–1.2)
GFR calc Af Amer: 51 mL/min — ABNORMAL LOW (ref >60)
GFR calc Af Amer: 54 mL/min — ABNORMAL LOW (ref >60)
GFR calc non Af Amer: 42 mL/min — ABNORMAL LOW (ref >60)
Potassium: 2.8 mEq/L — ABNORMAL LOW (ref 3.5–5.1)
Sodium: 140 mEq/L (ref 135–145)
Sodium: 140 mEq/L (ref 135–145)
Total Bilirubin: 1.8 mg/dL — ABNORMAL HIGH (ref 0.3–1.2)
Total Protein: 6.7 g/dL (ref 6.0–8.3)

## 2010-05-28 LAB — CULTURE, ROUTINE-ABSCESS

## 2010-05-28 LAB — CULTURE, BLOOD (ROUTINE X 2): Culture: NO GROWTH

## 2010-05-28 LAB — HEMOGLOBIN AND HEMATOCRIT, BLOOD
HCT: 28 % — ABNORMAL LOW (ref 36.0–46.0)
Hemoglobin: 9.1 g/dL — ABNORMAL LOW (ref 12.0–15.0)

## 2010-05-28 LAB — DIFFERENTIAL
Basophils Absolute: 0 10*3/uL (ref 0.0–0.1)
Basophils Relative: 0 % (ref 0–1)
Eosinophils Absolute: 0 10*3/uL (ref 0.0–0.7)
Eosinophils Absolute: 0 10*3/uL (ref 0.0–0.7)
Eosinophils Relative: 0 % (ref 0–5)
Eosinophils Relative: 0 % (ref 0–5)
Lymphs Abs: 0.5 10*3/uL — ABNORMAL LOW (ref 0.7–4.0)
Monocytes Absolute: 0.5 10*3/uL (ref 0.1–1.0)
Monocytes Relative: 7 % (ref 3–12)

## 2010-05-28 LAB — URINE MICROSCOPIC-ADD ON

## 2010-05-28 LAB — URINALYSIS, ROUTINE W REFLEX MICROSCOPIC
Glucose, UA: NEGATIVE mg/dL
Glucose, UA: NEGATIVE mg/dL
Hgb urine dipstick: NEGATIVE
Nitrite: NEGATIVE
Specific Gravity, Urine: 1.045 — ABNORMAL HIGH (ref 1.005–1.030)
Specific Gravity, Urine: 1.023 (ref 1.005–1.030)
pH: 5.5 (ref 5.0–8.0)
pH: 5.5 (ref 5.0–8.0)

## 2010-05-28 LAB — CROSSMATCH: Antibody Screen: NEGATIVE

## 2010-05-28 LAB — CBC
HCT: 23.1 % — ABNORMAL LOW (ref 36.0–46.0)
Hemoglobin: 7.7 g/dL — CL (ref 12.0–15.0)
MCHC: 33.4 g/dL (ref 30.0–36.0)
MCHC: 33.2 g/dL (ref 30.0–36.0)
MCV: 79.4 fL (ref 78.0–100.0)
MCV: 79.8 fL (ref 78.0–100.0)
Platelets: 111 10*3/uL — ABNORMAL LOW (ref 150–400)
Platelets: 120 10*3/uL — ABNORMAL LOW (ref 150–400)
RBC: 3.8 MIL/uL — ABNORMAL LOW (ref 3.87–5.11)
RBC: 4.07 MIL/uL (ref 3.87–5.11)
RDW: 16 % — ABNORMAL HIGH (ref 11.5–15.5)
RDW: 15.3 % (ref 11.5–15.5)
WBC: 4.7 10*3/uL (ref 4.0–10.5)
WBC: 6.2 10*3/uL (ref 4.0–10.5)

## 2010-05-28 LAB — GLUCOSE, CAPILLARY
Glucose-Capillary: 102 mg/dL — ABNORMAL HIGH (ref 70–99)
Glucose-Capillary: 109 mg/dL — ABNORMAL HIGH (ref 70–99)
Glucose-Capillary: 123 mg/dL — ABNORMAL HIGH (ref 70–99)
Glucose-Capillary: 148 mg/dL — ABNORMAL HIGH (ref 70–99)
Glucose-Capillary: 99 mg/dL (ref 70–99)
Glucose-Capillary: 99 mg/dL (ref 70–99)
Glucose-Capillary: 109 mg/dL — ABNORMAL HIGH (ref 70–99)

## 2010-05-28 LAB — POCT I-STAT 3, ART BLOOD GAS (G3+)
Acid-base deficit: 5 mmol/L — ABNORMAL HIGH (ref 0.0–2.0)
Bicarbonate: 20 mEq/L (ref 20.0–24.0)
O2 Saturation: 98 %
TCO2: 21 mmol/L (ref 0–100)
pCO2 arterial: 34.8 mmHg — ABNORMAL LOW (ref 35.0–45.0)
pO2, Arterial: 112 mmHg — ABNORMAL HIGH (ref 80.0–100.0)

## 2010-05-28 LAB — URINE CULTURE: Culture: NO GROWTH

## 2010-05-28 LAB — LIPASE, BLOOD: Lipase: 14 U/L (ref 11–59)

## 2010-05-28 LAB — PHOSPHORUS: Phosphorus: 3.6 mg/dL (ref 2.3–4.6)

## 2010-06-01 LAB — POCT I-STAT, CHEM 8
Creatinine, Ser: 0.9 mg/dL (ref 0.4–1.2)
Hemoglobin: 10.5 g/dL — ABNORMAL LOW (ref 12.0–15.0)
Sodium: 138 mEq/L (ref 135–145)
TCO2: 30 mmol/L (ref 0–100)

## 2010-06-01 LAB — GLUCOSE, CAPILLARY
Glucose-Capillary: 85 mg/dL (ref 70–99)
Glucose-Capillary: 99 mg/dL (ref 70–99)

## 2010-06-11 ENCOUNTER — Other Ambulatory Visit: Payer: Self-pay | Admitting: Gastroenterology

## 2010-06-30 NOTE — Op Note (Signed)
NAMEALLEXA, ACOFF          ACCOUNT NO.:  [id number]   MEDICAL RECORD NO.:  [id number]          PATIENT TYPE:  AMB   LOCATION:  SDS                          FACILITY:  [facility]   PHYSICIAN:  Balinda Quails, M.D.    DATE OF BIRTH:  03-Apr-1945   DATE OF PROCEDURE:  02/28/2008  DATE OF DISCHARGE:  02/28/2008                               OPERATIVE REPORT   DIAGNOSIS:  Ischemic rest pain, right foot.   PROCEDURE:  1. Right lower extremity arteriogram.  2. percutaneous transluminal angioplasty, right superficial femoral      artery, popliteal, and anterior tibial arteries.   ACCESS:  Left common femoral artery 7-French sheath.   CONTRAST:  90 mL Visipaque.   COMPLICATIONS:  None apparent.   CLINICAL NOTE:  Tula Schryver is a 65 year old female with history  of type 2 diabetes and advanced peripheral vascular disease.  She has  previously had multiple intervention procedures in the right lower  extremity with stents placed in the right superficial, popliteal, and  anterior tibial arteries.  She is brought to the cath lab at this time  due to marked reduction of right ankle-brachial index and ischemic rest  pain.   PROCEDURE NOTE:  The patient was brought to the cath lab in stable  condition.  Placed in supine position.  Left groin was prepped and  draped in sterile fashion.  Skin and subcutaneous tissues were instilled  with 1% Xylocaine.  An 18-gauge needle was introduced in the left common  femoral artery.  A 0.035 Amplatz guidewire advanced through the needle  into the mid abdominal aorta.  A short 6-French sheath was advanced over  the guidewire.  The dilator was removed and sheath flushed with heparin  saline solution.  A crossover catheter was then advanced over the  guidewire and engaged in the right common iliac artery.  A long-angled  Glidewire was then advanced down into the right superficial femoral  artery, a catheter exchange made for an end-hole catheter which  was  placed in the right external iliac artery.   Right lower extremity arteriography was obtained.  This revealed patency  of the right external iliac artery.  The right common femoral artery was  intact.  The right profunda femoris artery was normal.  The proximal  right superficial femoral artery was patent.  The mid right superficial  femoral artery revealed an indwelling stent with an area of subtotal  occlusion.  There was then complete occlusion in the distal third of the  right superficial femoral artery.  There is a long stented segment of  right superficial femoral and popliteal artery which were all occluded  beyond the adductor canal.  There was a stent in the origin of the right  anterior tibial artery which was also occluded.  The proximal right  anterior tibial artery reconstituted due to collaterals.  Single vessel  anterior tibial runoff to the right foot.   The Glidewire was advanced down into right superficial femoral artery.  The 6-French sheath was removed and a 7-French Terumo sheath advanced  over the Glidewire into the right external iliac artery.  The dilator  was removed and sheath flushed with heparin saline solution.  The  patient was administered 5000 units heparin intravenously.  Using a  Quick-Cross catheter and angled Glidewire, the Glidewire was advanced  down through the occluded segment of right superficial femoral artery  and popliteal artery to the origin of the right anterior tibial artery.  The Glidewire could be advanced into the right anterior tibial artery;  however, the Quick-Cross catheter could not be advanced across the  origin of the right anterior tibial artery.  The patient was  administered a total of 7000 units of heparin intravenously.   A 2 x 5 x 120 Fox SV balloon was then advanced over the guidewire down  to the origin of the right anterior tibial artery and inflated at 6  atmospheres x30 seconds.  There was an attempt to pass the  balloon into  the anterior tibial artery unsuccessfully.  An exchange was then made  for a 2 x 40 apex balloon which did cross the right anterior tibial  artery origin, was inflated at 14 atmospheres x50 seconds.   A catheter exchange was then made for a 3 x 120 Fox SV balloon which was  inflated at 12 atmospheres x75 seconds in the right anterior tibial  artery origin and the popliteal artery.  An exchange was then made for a  5 x 100 Sterling balloon which was inflated at 12 atmospheres x40  seconds in the right popliteal artery.  An exchange was then made for a  6 x 100 Sterling balloon which was inflated at 8 atmospheres x35 seconds  in the popliteal artery.  This was then brought up throughout the length  of the right superficial femoral artery stent and inflated at 14  atmospheres throughout the length of right popliteal artery stent.   At completion of this, an arteriogram was obtained.  There was an area  of residual stenosis at the level of the patella and the 6 x 100  Sterling balloon was re-advanced over the guidewire and inflated at 14  atmospheres x40 seconds.  A completion arteriogram was then obtained  revealing evidence of patency of the right superficial femoral artery,  popliteal artery, and origin of anterior tibial artery with runoff into  the right foot via the anterior tibial artery.  There were no apparent  complications.   The Terumo sheath was drawn back into the right common iliac artery, a  guidewire reinserted and exchange made for a short 7-French sheath.  This was placed in the left groin.   There were no apparent complications.   FINAL IMPRESSION:  1. Subtotal occlusion of right superficial femoral artery, popliteal      artery, and origin of right anterior tibial artery.  2. Successful balloon angioplasty of right superficial femoral,      popliteal, and anterior tibial artery with reconstitution and re-      establishment of flow into the right foot  in line.      Balinda Quails, M.D.  Electronically Signed     PGH/MEDQ  D:  02/28/2008  T:  02/29/2008  Job:  [id number]

## 2010-06-30 NOTE — Procedures (Signed)
BYPASS GRAFT EVALUATION   INDICATION:  Follow up bilateral lower extremity revascularization.   HISTORY:  Diabetes:  No.  Cardiac:  Periodic arrhythmias.  Hypertension:  Yes.  Smoking:  No.  Previous Surgery:  Right SFA, popliteal artery, anterior tibial artery  PTA on 09/08/07 by Dr. Madilyn Fireman.  Right superficial femoral artery PTA,  left superficial femoral artery PTA and stent in September, 2004.  Left  femoropopliteal artery bypass graft in January, 2007.  Repair of left  vein graft stenosis in May, 2007.  Right distal superficial femoral and  popliteal artery atherectomies on 07/07/05.  Right superficial femoral  and popliteal anterior tibial artery PTA's on 03/17/07 by Dr. Madilyn Fireman.   SINGLE LEVEL ARTERIAL EXAM                               RIGHT              LEFT  Brachial:                    128                124  Anterior tibial:             65                 70  Posterior tibial:            50                 100  Peroneal:  Ankle/brachial index:        0.51               0.78   PREVIOUS ABI:  Date: 07/13/07  RIGHT:  0.5  LEFT:  0.77   LOWER EXTREMITY BYPASS GRAFT DUPLEX EXAM:   DUPLEX:  1. Patent right SFA, popliteal, and anterior tibial artery stents with      elevated velocities noted, as shown in the diagram.  2. Patent left femoral-popliteal artery bypass graft with elevated      velocities as noted in the diagram.   IMPRESSION:  1. Stable ankle brachial indices from study two months preoperative.      No postoperative ankle brachial indices were obtained for      comparison.  2. Patent right superficial femoral artery, popliteal artery, and      anterior tibial artery stents with elevated velocities.  3. Patent left femoral-popliteal artery bypass graft with elevated      velocities. `   ___________________________________________  P. Liliane Bade, M.D.   AS/MEDQ  D:  11/16/2007  T:  11/17/2007  Job:  [id number]

## 2010-06-30 NOTE — Op Note (Signed)
Paula, [name]          ACCOUNT NO.:  [id number]   MEDICAL RECORD NO.:  [id number]          PATIENT TYPE:  AMB   LOCATION:  SDS                          FACILITY:  [facility]   PHYSICIAN:  Balinda Quails, M.D.    DATE OF BIRTH:  05/19/1945   DATE OF PROCEDURE:  11/04/2006  DATE OF DISCHARGE:  11/04/2006                               OPERATIVE REPORT   SURGEON:  Balinda Quails, M.D.   DIAGNOSIS:  Recurrent right lower extremity ischemia.   PROCEDURE:  1. Right lower extremity arteriogram.  2. Right superficial femoral artery percutaneous transluminal      angioplasty.  3. Right popliteal artery percutaneous transluminal angioplasty.  4. Right anterior tibial artery percutaneous transluminal angioplasty.   ACCESS:  Left common femoral artery 6-French sheath.   CONTRAST:  220 mL Visipaque.   COMPLICATIONS:  None apparent.   CLINICAL NOTE:  Paula [name] is a 65 year old female with advanced  peripheral vascular disease.  She previously has undergone left femoral  tibial bypass.  She has a history of severe ischemia in the right lower  extremity with extensive stenting of the right anterior tibial,  popliteal, and superficial femoral artery.  She has developed recurrent  pain in her right foot and duplex evaluation revealed severe stenosis of  the right superficial femoral, popliteal, and anterior tibial arteries.  She is brought to the cath lab at this time for diagnostic workup and  possible further endovascular intervention.  She is on aspirin and  Plavix.   PROCEDURE NOTE:  The patient was brought to the cath lab in stable  condition.  She was placed in a supine position.  The left groin was  prepped and draped in a sterile fashion.  The skin and subcutaneous  tissues were instilled with 1% Xylocaine.  The left common femoral  artery was accessed with an 18 gauge needle and a 0.035 Rosen guidewire  advanced through the needle into the mid abdominal aorta.  An  attempt  was made to pass the 6-French sheath over the guidewire.  This was quite  difficult, however, due to scar.  Therefore, the 6-French dilator was  passed over the guidewire, the Rosen guidewire removed, and an Amplatz  super stiff guidewire passed through the sheath.  The sheath was then  removed and a 6-French sheath advanced over the [facility] guidewire.  A  crossover catheter was then advanced over the Amplatz super stiff  guidewire to the origin of the right common iliac artery which was  engaged.  The Rosen guidewire was advanced down into the right common  femoral artery.  The crossover catheter was removed and an Indole  catheter advanced down into the right common femoral artery.   Right lower extremity arteriography was obtained.  This revealed a  patent right common femoral and profunda femoris arteries.  The proximal  right superficial femoral artery was patent.  The stented segment in the  distal right superficial femoral artery revealed marked in-stent  restenosis.  There was also significant in-stent restenosis in the  popliteal stent and in the right anterior tibial stent at the origin of  the right anterior tibial artery.  The distal right anterior tibial  artery revealed very sluggish flow but was patent.   The Amplatz super stiff guidewire was then advanced through the Indole  catheter in the right common femoral artery.  The left femoral sheath  was removed and a 6-French Terumo sheath advanced over guidewire across  the bifurcation into the right external iliac artery.  The super stiff  guidewire was then removed.  The dilator was removed.  A 0.035 Wholey  guidewire was then advanced down the right superficial femoral artery  across the stented segment and into the right popliteal artery.  Dilatation of the right superficial femoral artery was carried out with  a 6 x 40 Inferion balloon at 14 atmospheres x 60 seconds and 12  atmospheres x 60 seconds.  Following  this, another arteriogram was  obtained.  This revealed significant in-stent restenosis in the right  anterior tibial artery, single vessel runoff, and the right popliteal  artery.  This was dilated with a 3 x 80 Inferion deep balloon at 10  atmospheres x 120 seconds.  A second dilatation in the popliteal artery  was carried out at 14 atmospheres x 120 seconds.   Following this, a 4 x 20 Angiosculpt balloon was advanced over  guidewire.  This was used to dilate the right popliteal artery for a  total of 6 dilatations at 8 atmospheres.  The Angiosculpt balloon was  then removed and a 4 x 80 Powerflex balloon was inflated at 12  atmospheres x 40 seconds with a second inflation at 12 atmospheres x 40  seconds in the right popliteal artery.  A 6 x 80 balloon was then  advanced over the guidewire and used to dilate the right superficial  femoral artery at 14 atmospheres for a total of 6 inflations.   The balloon catheter was then removed.  A completion arteriogram was  obtained.  This revealed an excellent technical result.  Minimal  residual stenosis was present within the right superficial femoral and  popliteal arteries.  There was brisk runoff through the right anterior  tibial artery to the right foot.  No apparent complications.   Final contrast was 220 mL Visipaque.  The patient did receive 6000 units  of heparin intravenously prior to intervention with an ACT of 266  seconds.  At completion of intervention, the ACT measured 230 seconds.  She also received femoral 125 mcg intravenously during the procedure.  The patient will be transferred to the holding area and the left femoral  sheath removed when the ACT is appropriate.   FINAL IMPRESSION:  1. Recurrent ischemic right foot secondary to in-stent restenosis      right anterior tibial, popliteal and superficial femoral arteries.  2. Successful angioplasty right anterior tibial, popliteal and      superficial femoral arteries with  an excellent technical result.      Balinda Quails, M.D.  Electronically Signed     PGH/MEDQ  D:  11/04/2006  T:  11/05/2006  Job:  [id number]

## 2010-06-30 NOTE — Procedures (Signed)
LOWER EXTREMITY ARTERIAL EVALUATION-SINGLE LEVEL   INDICATION:  Lower extremity arterial evaluation status post right fem  to anterior tibial artery bypass graft.   HISTORY:  Diabetes:  No.  Cardiac:  Periodic arrhythmias.  Hypertension:  Yes.  Smoking:  No.  Previous Surgery:  Right fem to anterior tibial artery bypass graft on  08/09/2008 by Dr. Madilyn Fireman, left fem-pop bypass graft in 2007 with multiple  bilateral subsequent angioplasties.   RESTING SYSTOLIC PRESSURES: (ABI)                          RIGHT                LEFT  Brachial:               70                   78  Anterior tibial:        160 (>1.0)           74 (0.95)  Posterior tibial:       66                   48  Peroneal:  DOPPLER WAVEFORM ANALYSIS:  Anterior tibial:        Bi to monophasic     Monophasic  Posterior tibial:       Monophasic           Monophasic  Peroneal:   PREVIOUS ABI'S:  Date:  08/12/2008  RIGHT:  >1.0  LEFT:  0.88   IMPRESSION:  1. Right ABI suggests patent right fem to inferior tibial artery      bypass graft with no significant arterial occlusive disease.  2. Left ABI suggests no significant arterial occlusive disease with a      patent left fem-pop bypass graft.  3. ABIs are stable compared to previous study bilaterally.   ___________________________________________  P. Liliane Bade, M.D.   MC/MEDQ  D:  08/29/2008  T:  08/29/2008  Job:  [id number]

## 2010-06-30 NOTE — Procedures (Signed)
BYPASS GRAFT EVALUATION   INDICATION:  Followup of right fem to anterior tibial artery bypass  graft January of 2010.   HISTORY:  Diabetes:  No.  Cardiac:  Arrhythmia.  Hypertension:  Yes.  Smoking:  No.  Previous Surgery:  Right fem to anterior tibial artery bypass graft  08/09/2008 by Dr. Madilyn Fireman, left fem-pop bypass graft in 2006 with multiple  bilateral subsequent angioplasty.   SINGLE LEVEL ARTERIAL EXAM                               RIGHT              LEFT  Brachial:                    147                149  Anterior tibial:             142                137  Posterior tibial:            38                 67  Peroneal:  Ankle/brachial index:        0.95               0.92   PREVIOUS ABI:  Date:  08/29/2008  RIGHT:  >2  LEFT:  0.95   LOWER EXTREMITY BYPASS GRAFT DUPLEX EXAM:   DUPLEX:  Biphasic waveforms are noted proximal to and throughout bypass  graft.  Monophasic waveforms distal to bypass graft and distal arteries.   IMPRESSION:  1. Greater than 75% stenosis at the distal anastomosis, vessel is also      tortuous.  2. Ankle brachial indices are within normal limits.        ___________________________________________  Larina Earthly, M.D.   CJ/MEDQ  D:  12/20/2008  T:  12/20/2008  Job:  [id number]

## 2010-06-30 NOTE — Assessment & Plan Note (Signed)
OFFICE VISIT   DECEMBER, HEDTKE D  DOB:  1945/11/11                                       08/01/2008  ZOXWR#:[id number]   Patient has had multiple interventions in her right lower extremity for  chronic peripheral vascular disease.  She has reached, again, a critical  restenosis level.  Will require further intervention.   The plan at this time is to try the Pathways atherectomy device on her  right leg.  This will be electively scheduled.   Balinda Quails, M.D.  Electronically Signed   PGH/MEDQ  D:  08/01/2008  T:  08/01/2008  Job:  [id number]

## 2010-06-30 NOTE — Op Note (Signed)
Paula [name]          ACCOUNT NO.:  [id number]   MEDICAL RECORD NO.:  [id number]          PATIENT TYPE:  INP   LOCATION:  2031                         FACILITY:  [facility]   PHYSICIAN:  Balinda Quails, M.D.    DATE OF BIRTH:  [birth year]   DATE OF PROCEDURE:  08/07/2008  DATE OF DISCHARGE:                               OPERATIVE REPORT   DIAGNOSIS:  Ischemic right foot.   PROCEDURE:  Right lower extremity arteriogram.   PHYSICIAN:  Balinda Quails, MD   ACCESS:  Left common femoral artery 7-French sheath.   CONTRAST:  Visipaque 60 mL.   COMPLICATIONS:  None apparent.   CLINICAL NOTE:  Paula [name] is a 65 year old female with advanced  peripheral vascular disease.  History of left femoral-tibial bypass.  Multiple previous interventional procedures on the right lower extremity  including stenting of the anterior tibial, popliteal, and superficial  femoral arteries.  She has had multiple restenoses associated with this.  She has developed recurrent ischemic right foot with low ABI.  Brought  to the cath lab at this time for further diagnostic arteriography and  possible intervention.   PROCEDURE NOTE:  The patient was brought to the cath lab in stable  condition.  Placed in supine position.  Both legs were prepped and  draped in sterile fashion.  Skin and subcutaneous tissue of left groin  instilled with 1% Xylocaine.  Administered Valium 2.5 mg IV and fentanyl  25 mcg IV.  Left groin accessed with an 18 gauge needle and common  femoral artery.  A 0.035 Versacore guidewire advanced through the needle  and into the mid abdominal aorta.  Initial attempt was made to pass a 7-  [place] Terumo sheath unsuccessfully due to scar in the left groin.  Therefore, a 7-French dilator was advanced over the guidewire and the  guidewire exchanged for an Amplatz super stiff guidewire.  Over the  Amplatz super stiff guidewire was then passed a 7-French dilator, 8-  [place] dilator, and  8-French sheath.  Following this, the 7-French  Terumo sheath was advanced over the guidewire.  The Terumo sheath  advanced to the proximal left common iliac artery.  Guidewire exchange  made for an Versacore guidewire and a IMA catheter.  The IMA catheter  was engaged in the right common iliac artery.  The Versacore guidewire  advanced down into the right common femoral artery.  The catheter  removed.  The dilator reinserted and the Terumo sheath was advanced down  into the right external iliac artery.  The right superficial femoral  artery was then accessed with the Versacore guidewire.  A 0.035 Quick-  Cross catheter was then advanced over the guidewire down into the right  superficial femoral artery.  Right superficial femoral arteriography was  obtained with injection of contrast.  This revealed multiple areas of  subtotal occlusion and stenosis within the stents of the right  superficial femoral artery.  Several attempts were then made across the  subtotal occlusions including guidewire manipulation with an angled  Glidewire, 0.035 angled Glidewire, a [employer] 6014 guidewire.  A  Fox cross balloon was  used to dilate an area of stenosis in the right  superficial femoral artery 14 atmospheres for 20 seconds.  Several  further attempts were then made to pass the guidewire into the right  anterior tibial artery.  The 0.035 guidewire was advanced into the right  anterior tibial artery.  Eventually, the Quick-Cross catheter was  advanced down into the right anterior tibial artery.  A right anterior  tibial arteriogram was obtained.  This revealed patency of the proximal  right anterior tibial artery, however, there was occlusion in the distal  calf with a large collateral reconstituting dorsalis pedis artery at the  ankle.  This precluded further intervention.  This completed the  procedure.  Guidewires and catheters were removed.  Catheter was left in  place in the left femoral  due to injection of 5000 units of heparin  intravenously at the beginning of the procedure.   The patient tolerated the procedure well.  No apparent complications.  Transferred to the holding area in stable condition.   Plan at this time is to attempt a limb salvage right femoral anterior  tibial bypass.      Balinda Quails, M.D.  Electronically Signed     PGH/MEDQ  D:  08/07/2008  T:  08/08/2008  Job:  [id number]

## 2010-06-30 NOTE — Op Note (Signed)
Paula [name]          ACCOUNT NO.:  [id number]   MEDICAL RECORD NO.:  [id number]          PATIENT TYPE:  INP   LOCATION:  2009                         FACILITY:  [facility]   PHYSICIAN:  Balinda Quails, M.D.    DATE OF BIRTH:  March 04, 1945   DATE OF PROCEDURE:  08/09/2008  DATE OF DISCHARGE:  08/16/2008                               OPERATIVE REPORT   SURGEON:  Balinda Quails, MD   ASSISTANT:  Jerold Coombe, PA   ANESTHETIC:  General endotracheal.   PREOPERATIVE DIAGNOSIS:  Ischemic right foot.   POSTOPERATIVE DIAGNOSIS:  Ischemic right foot.   PROCEDURE:  Right femoral-anterior tibial bypass with translocated  reverse saphenous vein graft.   CLINICAL NOTE:  Paula [name] is a 65 year old female with a  history of advanced peripheral vascular disease.  She has had multiple  previous percutaneous interventions for severe ischemia of her right  lower extremity.  Her most recent arteriogram revealed disease not  amenable any further percutaneous treatment.  She is brought to the  operating at this time for limb salvage right femoral-tibial bypass.   OPERATIVE PROCEDURE:  The patient was brought to the operating room in  stable condition.  Placed in supine position.  General endotracheal  anesthesia was induced.  Right leg prepped and draped in sterile  fashion.   A longitudinal skin incision made through the right groin.  Subcutaneous  tissue divided with electrocautery.  Deep dissection carried down  through the lymphatics.  The inguinal ligament identified.  The right  common femoral artery mobilized proximally and encircled with a vessel  loop.  Distal dissection carried down along the right common femoral  artery to the origin of the superficial femoral and profunda femoris  arteries which were each encircled with vessel loops.  The proximal  right superficial femoral artery was patent and a short segment of this  was dissected out as this was a soft vessel  for proximal anastomosis.   In the medial aspect of the incision, the saphenofemoral junction was  exposed.  The saphenous vein tributaries were ligated with 3-0 silk and  divided.  The saphenous vein was then exposed throughout the length of  the right leg through separated longitudinal skin incisions.  The vein  was borderline in size, 3-4 mm in the mid thigh.  It was bifid in the  distal thigh.  The vein was fully exposed through separated longitudinal  incisions down to the right medial malleolus.  Tributaries of vein  ligated with 3-0 silk and divided.   The right anterior tibial artery was then exposed at the ankle joint.  A  longitudinal skin incision made directly over the right anterior tibial  artery.  Dissection carried down to expose the anterior tibial artery.  This was a moderate-sized vessel, 2 mm in size.  Moderately diseased  with plaque.  The vessel was encircled proximally and distally with  vessel loops.   Ensuring that an adequate length of saphenous vein had been harvested,  the vein was then ligated proximally and distally with 2-0 silk,  cannulated, and dilated with heparin saline.  Repair  was carried out and  were required with 7-0 Prolene suture.   The patient was then administered 7000 units of heparin intravenously.  Adequate circulation time permitted.  The right proximal superficial  femoral artery was controlled with bulldog clamps and a longitudinal  arteriotomy made.  The reverse greater saphenous vein was beveled and  anastomosed end-to-side to the proximal right superficial femoral artery  using running 6-0 Prolene suture.  The graft then flushed with excellent  flow present through the graft.  This was then brought down to the  superficial saphenous vein bed and tunneled across to the exposed  anterior tibial artery at the ankle.  The anterior tibial artery  controlled proximally and distally with bulldog clamps.  A longitudinal  arteriotomy made.   The vein graft was beveled and anastomosed end-to-  side to the anterior tibial artery using running 7-0 Prolene suture.  At  completion of this, flushing was carried out.  Clamps were then removed.  Excellent flow was present through the graft.  The right foot was well  perfused.   Adequate hemostasis obtained.  Sponge and instrument counts correct.   The anterior tibial incision was closed with a single layer of  interrupted 3-0 mattress nylon.  The vein harvest incisions were closed  with running 3-0 Vicryl suture and subcutaneous layer and staples  applied to skin.  The right groin incision closed with a deep layer of  running 2-0 Vicryl suture, superficial layer of running 3-0 Vicryl  suture.  Staples applied to skin.   The patient tolerated the procedure well and was stable throughout and  transferred to recovery room in stable condition with a patent right  femoral-anterior tibial bypass.      Balinda Quails, M.D.  Electronically Signed     PGH/MEDQ  D:  09/13/2008  T:  09/14/2008  Job:  [id number]

## 2010-06-30 NOTE — Discharge Summary (Signed)
NAMETAMZIN, BERTLING          ACCOUNT NO.:  [id number]   MEDICAL RECORD NO.:  [id number]          PATIENT TYPE:  INP   LOCATION:  2009                         FACILITY:  [facility]   PHYSICIAN:  Balinda Quails, M.D.    DATE OF BIRTH:  07/14/1945   DATE OF ADMISSION:  08/07/2008  DATE OF DISCHARGE:  08/16/2008                               DISCHARGE SUMMARY   DISCHARGE MEDICATIONS:  1. Aspirin 325 mg p.o. daily.  2. Lopressor 25 mg p.o. b.i.d.  3. Metformin 500 mg p.o. daily.  4. Plavix 75 mg p.o. daily.  5. Cipro 500 mg p.o. b.i.d.  6. Flagyl 500 mg p.o. t.i.d.  7. Benicar/hydrochlorothiazide 20/12.5 p.o. daily.  8. Vytorin 40/10 p.o. daily.  9. Hydrocodone 5/500 one to two p.o. q.4 h. p.r.n. pain.  She is given      a prescription for oxycodone 5 mg 1-2 tablets every 4 hours p.r.n.      pain, total number of 40 were given, no refills.  10.Protonix 40 mg p.o. daily, total number of 30 were given.   DISPOSITION:  She is being discharged home in stable condition with her  wounds healing well.  She is given careful instructions regarding the  care of her wounds and her activity levels.  She is to return to see Dr.  Madilyn Fireman in 2-3 weeks with ABIs.  The office will arrange a visit.   BRIEF IDENTIFYING STATEMENT:  For complete details, please refer the  typed history and physical.  Briefly, this 65 year old woman has had  recurrent problems with her right lower extremity.  She has had multiple  percutaneous interventions including stent placement and angioplasty.  She has advanced peripheral vascular disease.  Her ABIs in the right  foot are reduced to less than 0.3.  She was evaluated by Dr. Madilyn Fireman who  recommended further studies and possible revascularization.  She was  informed of the risks and benefits of the procedure and after careful  consideration elected to proceed with surgery.   HOSPITAL COURSE:  Preoperative workup was completed as an outpatient.  She was brought in through  Same-Day Surgery and underwent the right  lower extremity arteriogram which demonstrated severe disease in her  right lower arterial tree.  Based on her arteriogram, Dr. Madilyn Fireman felt her  to be a suitable candidate for revascularization.  She was informed of  the risks and benefits of the procedure and after careful consideration  elected to proceed with surgery.   On August 09, 2008, she was taken to the operating room and underwent a  right superficial femoral artery to anterior tibial artery bypass using  translocated reverse greater saphenous vein by Dr. Madilyn Fireman.  For complete  details, please refer the typed operative report.  The procedure was  without complications.  She was returned to the Postanesthesia Care Unit  extubated.  Following stabilization, she was transferred to a bed on a  surgical step-down unit.  She was observed overnight.  She was weak  postoperatively, but was able to be transferred to a bed on a surgical  convalescent floor.  Upon transfer, her activity level  was increased as  tolerated.   On August 16, 2008, she was walking and improving with physical therapy.  She was performing her activities of daily living appropriately.  She  was felt stable for discharge and was discharged home.   Prior to her discharge, she was evaluated by [facility] for her abdominal exam.  She did have diverticulitis and she is to see Dr. Johna Sheriff in followup  next week.   DIAGNOSIS:  Dictation ended at this point.      Wilmon Arms, PA      P. Liliane Bade, M.D.  Electronically Signed    KEL/MEDQ  D:  08/16/2008  T:  08/17/2008  Job:  [id number]

## 2010-06-30 NOTE — Assessment & Plan Note (Signed)
OFFICE VISIT   TAMMELA, BALES D  DOB:  10/06/1945                                       03/21/2008  [id number]#:[id number]   The patient underwent reintervention in her right lower extremity with  PTA of superficial femoral, popliteal and anterior tibial arteries on  02/28/2008.   She has noted marked improvement in her symptoms in the right foot.   Study carried out over at [facility] reveals brisk biphasic right  anterior tibial waveform with an ABI of 0.76.   Will continue to monitor this and plan reintervention as required.   Balinda Quails, M.D.  Electronically Signed   PGH/MEDQ  D:  03/21/2008  T:  03/21/2008  Job:  [id number]

## 2010-06-30 NOTE — Discharge Summary (Signed)
Paula [name], Paula [name]          ACCOUNT NO.:  [id number]   MEDICAL RECORD NO.:  [id number]          PATIENT TYPE:  INP   LOCATION:  5159                         FACILITY:  [facility]   PHYSICIAN:  Anselm Pancoast. Zachery Dakins, M.D.DATE OF BIRTH:  Oct 30, 1945   DATE OF ADMISSION:  05/13/2008  DATE OF DISCHARGE:  05/18/2008                               DISCHARGE SUMMARY   DISCHARGING PHYSICIAN:  Currie Paris, MD   CONSULTANTS:  Interventional Radiology, D. Oley Balm, MD.   CHIEF COMPLAINT/REASON FOR ADMISSION:  Paula [name] is a 65 year old  female patient who presented to the hospital with left lower quadrant  abdominal pain, found to have acute exacerbation of diverticulitis with  a large abscess in the left lower quadrant.  Her initial white count was  normal at 6200.  Her abdominal exam was consistent with pain but not  frank peritonitis.  The patient was admitted by Dr. Freida Busman with a  diagnosis of acute diverticulitis with microperforation and abscess.  Additional admitting diagnoses include known peripheral vascular  disease, hypertension, hypercholesterolemia, and diabetes.   HOSPITAL COURSE:  The patient was admitted, placed on empiric Zosyn  antibiotic therapy, IV fluid resuscitation.  The patient's primary care  physician, Dr. Timothy Lasso was notified of admission.   Because the patient did have some issues related to hypotension upon  admission, she was watched in the critical care unit.  She had  temperatures up to 103 within the first 24 hours and a medicine consult  was finally obtained.   By May 14, 2008, the patient was evaluated by Interventional Radiology  and she was subsequently taken to the Radiology suite where she  underwent placement of a 12-French pigtail drain into the left lower  quadrant abscess with an 80 mL of purulent fluid obtained.  Over the  next several days, the patient continued to spike fevers up to 101.5.  She is continued on IV Zosyn, drain  remained with purulent fluid.  Her  abdominal pain was decreasing.  Interventional Radiology also followed  along with [place] in regards to drain management.  Final abscess cultures  were positive for pansensitive E-coli.  Initial blood cultures had no  growth and the urine culture was negative as well.  The patient  continued to have fevers, but diet was slowly advanced.  She had minimal  to no pain and by date of discharge, she was transitioned over to oral  antibiotics.  She has apparently defervesced and otherwise her vital  signs were stable.  She was discharged home with the drain in place.  She was averaging about 50 mL out in a 24-hour period and plans were for  her to have an outpatient CT scan after evaluation by surgery and after  discharge.   FINAL DISCHARGE DIAGNOSES:  1. Abdominal pain secondary to left lower quadrant diverticulitis with      abscess.  2. Status post percutaneous drainage of left lower quadrant abscess,      cultures positive for pansensitive Escherichia coli.   DISCHARGE MEDICATIONS:  The patient will resume the following home  medications.  1. Aspirin 325 mg daily.  2.  Benicar hydrochlorothiazide 20/12.5 daily.  3. Metoprolol 25 mg b.i.d.  4. Plavix 75 mg daily.  5. Vytorin 10/20 daily.   New medications include:  1. Augmentin 875 one b.i.d.  2. Vicodin one every 4 hours as needed for pain.   DIET:  Low residue.   ACTIVITY:  May shower.   FOLLOWUP:  She is to follow up with Dr. Bertram Savin at [facility] on [phone].  She is to call to arrange an outpatient  appointment.   OTHER INSTRUCTIONS:  She is to flush the drain with 8-10 mL of normal  sterile saline every 8 hours.  Home health to assist.      Paula [name], N.P.      Anselm Pancoast. Zachery Dakins, M.D.  Electronically Signed    ALE/MEDQ  D:  06/25/2008  T:  06/26/2008  Job:  [id number]

## 2010-06-30 NOTE — Assessment & Plan Note (Signed)
OFFICE VISIT   Paula [name], Paula [name]  DOB:  [birth year]                                       06/23/2009  HQION#:[id number]   REASON FOR VISIT:  Followup.   HISTORY:  This is a 65 year old female who comes back in today for  routine surveillance.  She has undergone right femoral to anterior  tibial bypass graft by Dr. Madilyn Fireman on August 09, 2008, and this was done in  the setting of a heel ulcer as well as rest pain.  She did develop an  open area at the distal vein harvest site, which ultimately healed with  dressing changes.  Her initial ultrasound revealed high-grade velocities  at the distal anastomosis.  I had spoken with Dr. Madilyn Fireman and he was  concerned this may be more related to size mismatch rather than a true  stenosis.  She comes back in today for further followup.  Her wounds are  well-healed.  She is does not have any complaints at this time.   PHYSICAL EXAMINATION:  Heart rate 87, blood pressure 132/77, respiratory  rate 20, respirations are nonlabored.  General:  She is well-appearing,  in no acute distress.  Extremities:  No open wounds are present on both  lower extremities.  There is a palpable pulse within the anterior tibial  bypass graft on the right.   DIAGNOSTIC STUDIES:  I have independently reviewed her ultrasound.  This  reveals an ABI of 1.3 on the right and 0.82 on the left.  These are  essentially unchanged.  The distal anastomotic problem on the right was  consistent with a proximal native anterior tibial artery stenosis, not  the anastomotic site.  Velocities at the anastomosis are 212.  There is  a also proximal elevation in velocity at 270.  Multiple moderate  stenoses are seen within the left femoral-popliteal bypass graft, all  below 250.   ASSESSMENT/PLAN:  Status post bilateral lower extremity bypass.   PLAN:  The patient will continue with her routine surveillance  ultrasound, next scan in 3 months.  The issues we were  concerned about  with the distal anastomotic stenosis on the right no longer exist as  this was an inaccurate measurement.  We will continue to follow her  closely.  She will come back to see me in the year.  She will continue  with P scan protocols, which I will review and discuss with her as  opposed to having her see me in the office.     Jorge Ny, MD  Electronically Signed   VWB/MEDQ  [name]:  06/23/2009  T:  06/24/2009  Job:  [id number]

## 2010-06-30 NOTE — Assessment & Plan Note (Signed)
OFFICE VISIT   Paula, [name] D  DOB:  07-19-1945                                       11/11/2008  JYNWG#:[id number]   REASON FOR VISIT:  Follow-up bypass and wound.   HISTORY:  This is a 65 year old female former patient of Dr. Madilyn Fireman who  underwent right femoral to anterior tibial artery bypass graft on  08/09/08.  This was done in the setting of a small heel ulcer and for  rest pain.  She developed an open area in her distal vein harvest site  that is being treated with wet-to-dry dressing changes.  She comes back  in today for follow-up.  She feels that the wound is slowly getting  smaller.  She is having some swelling in her right foot.   PHYSICAL EXAMINATION:  She is 144/84, pulse 71.  Well-appearing, no  distress.  There is a palpable pulse in her graft.  The wound at her  distal vein harvest site is slightly more superficial.  There is a  healthy granulation tissue at its base.  It measures approximately 1 x 1  cm.  There is a smaller area just distal to this with a healthy  granulation tissue base, and mesh is approximately 2 mm x 1.5 cm.   At this point I think we can continue with dressing changes.  This is  going to be a slow wound to heal.  She is scheduled to get a  surveillance duplex in November.  She will contact me if there is a  problem.   Jorge Ny, MD  Electronically Signed   VWB/MEDQ  D:  11/11/2008  T:  11/12/2008  Job:  [id number]

## 2010-06-30 NOTE — Op Note (Signed)
Paula [name], Paula [name]          ACCOUNT NO.:  [id number]   MEDICAL RECORD NO.:  [id number]          PATIENT TYPE:  OIB   LOCATION:  5118                         FACILITY:  [facility]   PHYSICIAN:  Balinda Quails, M.D.    DATE OF BIRTH:  1945/06/11   DATE OF PROCEDURE:  09/06/2007  DATE OF DISCHARGE:                               OPERATIVE REPORT   PREOPERATIVE DIAGNOSIS:  Ischemic right foot.   PROCEDURES:  1. Right lower extremity arteriogram.  2. Right superficial femoral and popliteal percutaneous transluminal      angioplasty.  3. Right anterior tibial artery percutaneous transluminal angioplasty.   ACCESS:  Left common femoral artery 7-French sheath.   CONTRAST:  130 mL Visipaque.   COMPLICATIONS:  None apparent.   CLINICAL NOTE:  [place] has a history of advanced peripheral  vascular disease.  Multiple previous interventional procedures in the  right leg with stenting of the right anterior, tibial, popliteal, and  superficial femoral arteries.  She developed recurrent ischemic symptoms  and Doppler reveals restenosis of the stented segments.  Brought to the  cath lab at this time for diagnostic arteriography and potential  intervention.   PROCEDURE NOTE:  The patient was brought to the cath lab in stable  condition.  Placed in the supine position.  Left groin was prepped and  draped in the sterile fashion.  Administered 2 mg of Versed and 50 mcg  of fentanyl intravenously.   Skin and subcutaneous tissue of left groin was instilled with 1%  Xylocaine.  An 18-gauge needle introduced into the left common femoral  artery.  A 0.035 Rosen wire advanced through the needle into the mid  abdominal aorta.  A 6-French dilator was advanced over the guidewire.  The Rosen wire was then exchanged for an Amplatz super-stiff guidewire.  Over the Amplatz super-stiff guidewire, was passed a 7-French Terumo  sheath up to the aortic bifurcation.  A crossover catheter was then  advanced over the guidewire to the aortic bifurcation.  The Amplatz  super-stiff guidewire was removed and exchanged for a Rosen guidewire,  which was advanced down into the right external iliac artery.  The  crossover catheter advanced into right external iliac artery and the  Terumo sheath advanced into the right external iliac artery.  The  guidewire and catheter were then removed.   A right lower extremity arteriogram was then obtained with injection of  contrast through the sheath.  This revealed patent right external iliac  and common femoral arteries.  The right profunda femoris artery was  patent.  The proximal right superficial femoral artery was patent.  The  mid and distal right superficial femoral artery segment was stented with  marked in-stent restenosis.  There was subtotal occlusion of the stent  at right superficial femoral artery and popliteal artery.  The popliteal  artery also revealed extensive restenosis.  The anterior tibial artery  was the only runoff vessel and there was high-grade restenosis within  the stent in the proximal right anterior tibial artery.   At this time, a angled Glidewire was advanced through the sheath into  the right  superficial femoral artery and across the subtotal occlusion  in the stented right superficial femoral artery.  I advanced into the  right popliteal artery across the origin of the right anterior tibial  artery and into the distal right anterior tibial artery.  A  [employer] Quick-Cross catheter was then advanced over the guidewire  into the right anterior tibial artery.  The guidewire was removed.  Intraluminal position of the catheter was verified with injection of  contrast.  A 0.014 BMW guidewire was then advanced through the catheter  into the right anterior tibial artery.  The catheter was removed.  The  patient was administered a total of 7000 units heparin intravenously.   The right anterior tibial artery was then  dilated in the stented segment  with a 2 x 20 Angio-Sculpt balloon.  Two inflations were made at 12  atmospheres.  Exchange was then made for a 4 x 20 Angio-Sculpt balloon,  which was inflated throughout the diseased length of the right popliteal  and at superficial femoral arteries.  Several inflations were made.  This balloon was then exchanged for a 6 x 100 Sterling balloon.  Two  inflations were made in the right popliteal artery.  This then ruptured  and an exchange was made with a 6 x 80 OptiPro, which was inflated at 8  atmospheres for 3 inflations.  This was then ruptured and it was  replaced with a 5 x 10 Powerflex, which was inflated with 2 inflations  in this right superficial femoral artery.   A completion arteriogram was then obtained, this did reveal some  residual stenosis in the proximal right anterior tibial artery.  A 3 x  60 Fox SV balloon was then advanced over the guidewire and inflated in  the right anterior tibial artery along the stented segment at 12  atmospheres for 35 seconds.  The balloon was then removed.   A completion of arteriogram was obtained.  This revealed an adequate  result with recanalization of the right SFA, popliteal, and anterior  tibial arteries.  There was in-line flow to the right foot.   This completed the arteriogram procedure.  The sheath was then withdrawn  back into the left external iliac artery.  The patient was transferred  to the holding area stable in condition.   FINAL IMPRESSION:  1. Subtotal occlusion of stented right superficial femoral artery.  2. Subtotal occlusion of stented right popliteal artery.  3. In-stent restenosis, right anterior tibial artery.  4. Successful percutaneous intervention with superficial femoral      artery, popliteal, and anterior tibial percutaneous transluminal      angioplasty.      Balinda Quails, M.D.  Electronically Signed     PGH/MEDQ  D:  09/06/2007  T:  09/07/2007  Job:  [id number]

## 2010-06-30 NOTE — Procedures (Signed)
BYPASS GRAFT EVALUATION   INDICATION:  Follow up lower extremity bypass graft.   HISTORY:  Diabetes:  No.  Cardiac:  Arrhythmia.  Hypertension:  Yes.  Smoking:  No.  Previous Surgery:  Right femoral to anterior tibial artery bypass graft,  August 09, 2009 by Dr. Madilyn Fireman.  History of left femoral to popliteal bypass  graft in 2006.   SINGLE LEVEL ARTERIAL EXAM                               RIGHT              LEFT  Brachial:                    154                150  Anterior tibial:             146                142  Posterior tibial:            53                 137  Peroneal:  Ankle/brachial index:        0.95               0.92   PREVIOUS ABI:  Date: 06/23/2009  RIGHT:  1.30  LEFT:  0.92   LOWER EXTREMITY BYPASS GRAFT DUPLEX EXAM:   DUPLEX:  Patent right proximal SFA to anterior tibial artery bypass  graft with mildly elevated velocities of 250 cm/second at the proximal  anastomosis.   IMPRESSION:  1. Patent right SFA to anterior tibial artery bypass graft with mildly      elevated velocities as shown.  2. ABIs remain stable on the left.  3. Right ABIs have dropped, but still remain within normal limits.   ___________________________________________  V. Charlena Cross, MD   EM/MEDQ  D:  12/24/2009  T:  12/24/2009  Job:  [id number]

## 2010-06-30 NOTE — Procedures (Signed)
BYPASS GRAFT EVALUATION   INDICATION:  Follow up multiple bilateral lower extremity  vascularizations and stenting procedures.   HISTORY:  Diabetes:  No.  Cardiac:  Periodic arrhythmias.  Hypertension:  Yes.  Smoking:  No.  Previous Surgery:  Right superficial femoral artery PTA, left  superficial femoral artery PTA and stent in September, 2004; left fem-  pop bypass graft in January, 2007; repair of left vein graft stenosis in  May, 2007; right distal superficial femoral and popliteal artery  atherectomies on 07/07/05; right superficial femoral, popliteal,  anterior tibial artery PTA's on 03/17/07 by Dr. Madilyn Fireman.   SINGLE LEVEL ARTERIAL EXAM                               RIGHT              LEFT  Brachial:                    142                134  Anterior tibial:             72                 94  Posterior tibial:            54                 110  Peroneal:  Ankle/brachial index:        0.5                0.77   PREVIOUS ABI:  Date: 02/22/07  RIGHT:  0.39  LEFT:  0.76   LOWER EXTREMITY BYPASS GRAFT DUPLEX EXAM:   DUPLEX:  1. Patent right superficial femoral, popliteal, anterior tibial artery      PTAs with a mild increase in velocities noted at the distal thigh,      superficial femoral artery level.  2. Patent left fem-pop bypass graft with no focal increase in      velocities noted.   IMPRESSION:  1. Patent right superficial femoral, popliteal, anterior tibial artery      posterior tibial arteries with mildly elevated velocities noted, as      described above.  2. Patent left fem-popliteal bypass graft with no evidence of      stenosis.  3. Stable bilateral ankle brachial indices are noted when compared to      the previous examination on [facility]/22/07, [facility]    ___________________________________________  P. Liliane Bade, M.D.   CH/MEDQ  D:  07/13/2007  T:  07/13/2007  Job:  [id number]

## 2010-06-30 NOTE — H&P (Signed)
Paula [name], Paula [name]          ACCOUNT NO.:  [id number]   MEDICAL RECORD NO.:  [id number]          PATIENT TYPE:  INP   LOCATION:  5151                         FACILITY:  [facility]   PHYSICIAN:  Ollen Gross. Vernell Morgans, M.D. DATE OF BIRTH:  September 12, 1945   DATE OF ADMISSION:  07/16/2008  DATE OF DISCHARGE:                              HISTORY & PHYSICAL   HISTORY OF PRESENT ILLNESS:  Paula [name] is a 65 year old white female  who was recently hospitalized with a diverticular abscess.  She was  treated with antibiotics and percutaneous drain and this seemed to  resolve.  She was only given 10 days of antibiotics when she left the  hospital and her drain was removed about 2 weeks ago.  Yesterday, her  pain returned in the left lower quadrant.  She spiked a fever, in the  night, of 100.3 and came to the emergency department.  She otherwise  denies any chest pain or shortness of breath.  No diarrhea or dysuria.  Her other review of systems are unremarkable.   PAST MEDICAL HISTORY:  Significant for diverticulitis, hypertension,  arrhythmias, peripheral vascular disease, and a nonhealing ulcer over  the right foot.   PAST SURGICAL HISTORY:  Significant for tubal ligation.   MEDICATIONS:  Aspirin, Benicar, metoprolol, Plavix, Vytorin, metformin,  and Vicodin.   ALLERGIES:  CODEINE.   SOCIAL HISTORY:  She denies use of tobacco and occasionally drinks  alcohol.   FAMILY HISTORY:  Noncontributory.   PHYSICAL EXAMINATION:  VITAL SIGNS:  Her temperature is 103.3, pulse  121, and blood pressure 119/70.  GENERAL:  She is a well developed, well nourished white female, in no  acute distress.  SKIN:  Warm and dry.  No jaundice.  EYES:  Her extraocular movements are intact.  Pupils equal, round,  reactive to light.  Sclerae nonicteric.  LUNGS:  Clear bilaterally with no use of accessory respiratory muscles.  HEART:  Regular rate and rhythm with impulse in the left chest.  ABDOMEN:  Soft with some  mild left lower quadrant tenderness but no  guarding or peritonitis.  No palpable mass or hepatosplenomegaly.  EXTREMITIES:  No cyanosis, clubbing, or edema.  Good strength in her  arms and legs.  She does have a nonhealing ulcer on the right heel.  PSYCHOLOGIC:  She is alert and oriented x3 with no evidence of anxiety  or depression.   LABORATORY DATA:  On review of her lab work, she was significant for  white count of 7.5, hemoglobin 9.8.  Her CT was reviewed with the  radiologists and did show recurrence of her diverticular abscess with  some inflammatory change of the sigmoid colon.   ASSESSMENT AND PLAN:  This is a 65 year old white female with recurrence  now of her sigmoid diverticular abscess, but she does not have a  significant amount of abdominal pain to go along with it.  I think, at  this point,  because of that, it will be reasonable to readmit her to the hospital  for IV fluids and bowel rest.  We will start her back on Zosyn since the  original bug  was pansensitive E. coli.  Monitor her closely, if she  improves, then we may be able to keep her on antibiotics for the full 6-  8 weeks and then bring her back for an elective procedure at that time.      Ollen Gross. Vernell Morgans, M.D.  Electronically Signed     PST/MEDQ  D:  07/17/2008  T:  07/17/2008  Job:  [id number]

## 2010-06-30 NOTE — Procedures (Signed)
BYPASS GRAFT EVALUATION   INDICATION:  Follow up bilateral lower extremity bypass graft.   HISTORY:  Diabetes:  No.  Cardiac:  Arrhythmia.  Hypertension:  Yes.  Smoking:  No.  Previous Surgery:  Right femoral-anterior tibial artery bypass graft,  08/09/2008, by Dr. Madilyn Fireman.  History of left fem-pop bypass graft, 2006.  History of numerous other revascularizations.   SINGLE LEVEL ARTERIAL EXAM                               RIGHT              LEFT  Brachial:                    122                117  Anterior tibial:             158                100  Posterior tibial:            50                 85  Peroneal:  Ankle/brachial index:        1.30               0.82   PREVIOUS ABI:  Date: 12/20/2008  RIGHT:  0.95  LEFT:  0.92   LOWER EXTREMITY BYPASS GRAFT DUPLEX EXAM:   DUPLEX:  1. Patent right proximal superficial femoral artery to anterior tibial      artery bypass graft with mildly elevated velocities of 270 cm/s at      proximal anastomosis and 212 cm/s at the distal anastomosis.  2. Patent left femoral-popliteal artery bypass graft with mildly      elevated velocities at the inflow, proximal anastomosis, proximal      graft, and distal native.   IMPRESSION:  1. Patent right superficial femoral artery to anterior tibial artery      bypass graft with mildly elevated velocities as shown.  2. Patent left femoral-popliteal artery bypass graft with mildly      elevated velocities, as shown.  3. Stable bilateral ankle brachial indices when compared to previous      studies.   ___________________________________________  V. Charlena Cross, MD   AS/MEDQ  D:  06/23/2009  T:  06/23/2009  Job:  [id number]

## 2010-06-30 NOTE — Assessment & Plan Note (Signed)
OFFICE VISIT   Paula [name], Paula [name] D  DOB:  [birth year]                                       08/29/2008  ZOXWR#:[id number]   The patient underwent right femoral to anterior tibial bypass 08/09/2008  at [facility].  She returns at this time for initial  postoperative visit.  Incisions are healing adequately.  Graft is  patent.  The right foot is well-perfused.   Alternate sutures and staples will be removed today.  Return next week  for further removal of final staples and sutures.   She is due to undergo diverticular surgery next month and will stop her  Plavix 2 weeks prior to surgery and continue aspirin perioperatively.   Balinda Quails, M.D.  Electronically Signed   PGH/MEDQ  D:  08/29/2008  T:  08/30/2008  Job:  [id number]   cc:   Lorne Skeens. Hoxworth, M.D.

## 2010-06-30 NOTE — H&P (Signed)
[name], [name]          ACCOUNT NO.:  [id number]   MEDICAL RECORD NO.:  [id number]          PATIENT TYPE:  INP   LOCATION:  2031                         FACILITY:  [facility]   PHYSICIAN:  Balinda Quails, M.D.    DATE OF BIRTH:  1945/11/08   DATE OF ADMISSION:  08/07/2008  DATE OF DISCHARGE:                              HISTORY & PHYSICAL   ADMITTING PHYSICIAN:  Balinda Quails, MD   ADMISSION DIAGNOSIS:  Ischemic right foot.   HISTORY:  Paula [name] is a 65 year old female with known  peripheral vascular disease.  She has had left femoral popliteal bypass  carried out several years ago.  Has had recurrent problems with her  right lower extremity with multiple percutaneous interventions with  stent placement and angioplasty.  She has advanced peripheral vascular  disease.  Her ankle brachial index in the right foot is now reduced down  to less than 0.3.   She was brought to the cath lab today and underwent a right lower  extremity arteriogram, this reveals occlusion of the midcalf right  anterior tibial artery with reconstitution distally.   Due to advanced ischemia, she is admitted to the hospital at this time  for planned right femoral-tibial bypass.   PAST MEDICAL HISTORY:  1. Diverticulitis.  2. Hypertension.  3. Peripheral vascular disease.  4. Arrhythmias.   PAST SURGICAL HISTORY:  Tubal ligation.   MEDICATIONS:  1. Aspirin 325 mg daily.  2. Lopressor 25 mg b.i.d.  3. Metformin 500 mg daily.  4. Plavix 75 mg daily.  5. Cipro 500 mg b.i.d.  6. Flagyl 500 mg t.i.d.  7. Benicar/hydrochlorothiazide 20/12.5 daily.  8. Vytorin 40/10 daily.  9. Hydrocodone 5/500, 1-2 tablets as needed p.r.n.   ALLERGIES:  CODEINE.   SOCIAL HISTORY:  The patient does not currently use tobacco or alcohol  products.  She lives with her husband.  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   FAMILY HISTORY:  Noncontributory.   PHYSICAL EXAMINATION:  GENERAL:  Moderately  obese 65 year old female, no  acute distress.  Alert and oriented.  VITAL SIGNS:  BP is 115/66, pulse is 84 per minute, temperature 99.9, O2  sat 97%, respirations 20.  HEENT:  Mouth and throat are clear.  Normocephalic.  NECK:  Supple.  No thyromegaly or adenopathy.  CHEST:  Equal entry bilaterally without rales or rhonchi.  CARDIOVASCULAR:  No carotid bruits.  Normal heart sounds without  murmurs.  No gallops or rubs.  Regular rate and rhythm.  ABDOMEN:  Obese, soft, and nontender.  Left lower quadrant percutaneous  drain in place.  There are no masses or organomegaly.  EXTREMITIES:  2+ femoral pulses bilaterally.  No popliteal, posterior  tibial, or dorsalis pedis pulses.  Right foot reveals a crack heel, no  infection.  Mild rubor.  Intact sensation and motion.   IMPRESSION:  1. Ischemic right foot with advanced peripheral vascular disease.  2. Diverticulitis.  3. Hypertension.  4. Hyperlipidemia.   PLAN:  The patient will be admitted to [facility] for planned  right femoral-tibial bypass for limb salvage.  Balinda Quails, M.D.  Electronically Signed     PGH/MEDQ  D:  08/08/2008  T:  08/08/2008  Job:  [id number]

## 2010-06-30 NOTE — Assessment & Plan Note (Signed)
OFFICE VISIT   Paula [name], Paula [name]  DOB:  06/15/1945                                       04/13/2007  ZOXWR#:[id number]   HISTORY:  The patient underwent re-do angioplasty of the right lower  extremity on 03/17/2007 including right anterior tibial, popliteal,  superficial, femoral arteries.  This was PTA on all these vessels due to  recurrent stenosis.   She has been doing well since then.  No major complaints.  Ankle  brachial indices measure 0.82 on the right, 0.91 on the left.  Blood  pressure is 130/80, pulse 92 per minute, respirations 18 per minute.  She is alert and oriented.  No distress.  No pallor, cyanosis or  jaundice.  Lower extremity evaluation reveals intact femoral pulses.  No  pedal pulses palpable.  Feet are well perfused without ischemic skin  changes.   Return per protocol.   Balinda Quails, M.[name].  Electronically Signed   PGH/MEDQ  [name]:  04/13/2007  T:  04/15/2007  Job:  [id number]

## 2010-06-30 NOTE — H&P (Signed)
Paula [name], BUSHARD          ACCOUNT NO.:  [id number]   MEDICAL RECORD NO.:  [id number]          PATIENT TYPE:  INP   LOCATION:  2111                         FACILITY:  [facility]   PHYSICIAN:  Lennie Muckle, MD      DATE OF BIRTH:  10-02-1945   DATE OF ADMISSION:  05/13/2008  DATE OF DISCHARGE:                              HISTORY & PHYSICAL   ADMISSION DIAGNOSIS:  Perforated diverticulitis with abscess.  Ms.  Lobb is a 65 year old female of Dr. Timothy Lasso, who apparently began to  have a left lower quadrant pain yesterday.  She did have fevers and  chills.  She has some nausea and vomiting today.  She continued to have  pain mostly in the left lower quadrant.  She was seen in Dr. Ferd Hibbs  office and sent to emergency department for CT scan.  CT did reveal  large abscess in the left lower quadrant.  She feels somewhat better and  complains of pain when she has compression of the left lower quadrant.   PAST MEDICAL HISTORY:  1. Peripheral vascular disease.  2. Hypertension.  3. Hypercholesterolemia.  4. Diabetes.   PAST SURGICAL HISTORY:  Fem-pop and grafts.  She had several  angioplasties on the right.   MEDICATIONS:  Aspirin, Benicar, metoprolol, Plavix, Vytorin, and  metformin.   ALLERGIES:  CODEINE causes nausea and vomiting.   REVIEW OF SYSTEMS:  Negative other than the HPI.  She has no history of  cardiac or renal disease.   PHYSICAL EXAMINATION:  VITALS:  T-max 103.9, T-current 100.6, pulse 112,  blood pressure is currently 90/40.  She had some hypertension in the  emergency department, documented of 79/33.  She received fluid boluses.  GENERAL:  She was landed on stretcher, appears in no acute distress.  HEENT:  Head is normocephalic.  NECK:  Supple.  CHEST:  Clear to auscultation bilaterally.  CARDIOVASCULAR:  Tachycardic.  ABDOMEN:  Obese and soft.  She has tender to the left lower quadrant.  No peritonitis.  EXTREMITIES:  No deformity or edema.  Extremities  are warm.  SKIN:  No rashes.   RADIOGRAPHIC IMAGING:  CT reviewed.  There is a large abscess in the  left lower quadrant.  She did not receive oral contrast most likely from  diverticulitis.   LABORATORY DATA:  White count 6.2, hemoglobin and hematocrit 9.8 and  30.3.  BUN and creatinine 15 and 1.3.  Glucose is elevated at 163.   ASSESSMENT AND PLAN:  Perforated diverticulitis with abscess.  Currently, she does not have frank peritonitis.  I have talked her about  percutaneously draining the abscess cavity in order to try to do an  elective resection at a later time.  I have encouraged her to call, if  she has any acute change tonight, she might need to have a urgent  resection  and colostomy.  We will go ahead and correct her electrolytes.  Continue  with IV fluid resuscitation.  As she is somewhat hypertensive, Zosyn has  been started in the emergency department, and Dr. Timothy Lasso has been  contacted to make him aware of her admission.  Lennie Muckle, MD  Electronically Signed     ALA/MEDQ  D:  05/13/2008  T:  05/14/2008  Job:  [id number]   cc:   Gwen Pounds, MD

## 2010-06-30 NOTE — Op Note (Signed)
Paula [name], Paula [name]          ACCOUNT NO.:  [id number]   MEDICAL RECORD NO.:  [id number]          PATIENT TYPE:  INP   LOCATION:  1524                         FACILITY:  [facility]   PHYSICIAN:  Sharlet Salina T. Hoxworth, M.D.DATE OF BIRTH:  [birth year]   DATE OF PROCEDURE:  09/17/2008  DATE OF DISCHARGE:                               OPERATIVE REPORT   PREOPERATIVE DIAGNOSES:  Diverticulitis of the sigmoid colon with  recurrent diverticular abscess.   POSTOPERATIVE DIAGNOSES:  Diverticulitis of the sigmoid colon with  recurrent diverticular abscess.   SURGICAL PROCEDURE:  Sigmoid colectomy with anastomosis and mobilization  of the splenic flexure.   SURGEON:  Dr. Johna Sheriff.   ASSISTANT:  Dr. Cicero Duck.   ANESTHESIA:  General.   BRIEF HISTORY:  Paula [name] is a 65 year old female who presented in  the spring of this year with acute diverticulitis of the sigmoid colon  with a pericolonic abscess.  She underwent percutaneous drainage and  antibiotic treatment with initial improvement, but then represented in  June with a recurrent diverticular abscess requiring percutaneous  drainage.  She has continued to have a persistent small abscess and  purulent drainage.  Due to recurrent and persistent abscess, we have  recommended proceeding with elective sigmoid colectomy.  The nature of  the procedure, indications, risks of bleeding, infection,  cardiorespiratory complications and anastomotic leak were discussed and  understood.  She was brought to the operating room for this procedure.   DESCRIPTION OF OPERATION:  Following mechanical antibiotic bowel prep at  home, the patient was brought to the operating room, placed in a supine  position on the operating table and general endotracheal anesthesia was  induced.  A Foley catheter was placed.  The abdomen was widely sterilely  prepped and draped.  Correct patient and procedure were verified.  A  midline incision skirting the umbilicus  was used and dissection carried  down through the subcutaneous tissue and midline fascia and the  peritoneum entered under direct vision.  The sigmoid colon was quite  indurated and fixed.  The viscera were packed into the upper abdomen  exposing the sigmoid.  Initially, the proximal left colon was mobilized  dividing peritoneum along the line of Toldt.  We worked down toward the  inflammatory process.  The sigmoid colon was densely adherent to the  left pelvic sidewall, as well as the left fallopian tube.  Using careful  blunt and cautery dissection staying close to the colon, the bowel was  mobilized off of the pelvic sidewall.  An abscessed cavity was  encountered just deep to the peritoneum along the left pelvis and the  peritoneum overlying this was opened and a small amount of purulent  material evacuated.  The distal rectosigmoid was identified at an area  that was relatively noninflamed and chosen for the distal point of  resection.  Lateral peritoneal incision was made approximately from this  and with further careful blunt and cautery dissection we were finally  able to mobilize the sigmoid completely up off of the pelvic sidewall  down to its mesentery.  Examining the proximal colon, there was some  moderate  diverticulosis and thickening extending up to the distal left  colon and it was necessary to mobilize the splenic flexure and bring the  more proximal left colon down for anastomosis to get some healthy bowel.  The peritoneal incision was extended superiorly up around the splenic  flexure and the splenocolic ligament isolated and divided with the  harmonic scalpel.  The omentum was dissected up off of the proximal left  colon and distal transverse colon and the lesser omentum was divided  partially with the harmonic scalpel into the lesser sac, completely  mobilizing the splenic flexure and the distal transverse colon.  This  allowed some soft healthy bowel to be brought  down easily into the  pelvis for anastomosis.  The point of proximal resection was chosen.  This was cleaned of pericolic fat and mesentery with the harmonic  scalpel and was divided between [employer] clamps.  The mesentery of  the involved segment of bowel was then sequentially divided with the  harmonic scalpel and the larger vessels were also ligated proximally.  The dissection was kept out toward the bowel wall away from the pelvis  and then was carried down distally to the noninflamed distal sigmoid or  rectosigmoid where the bowel was quite soft without any diverticula and  this was cleaned of mesentery.  Two 2-0 silk stay sutures placed and the  bowel was sharply divided and the specimen removed.  Following this, an  end-to-end anastomosis was created with interrupted inverting full-  thickness 2-0 silk sutures.  It appeared widely patent under no tension  with good blood supply.  The anastomosis appeared airtight with pressure  and irrigation.  The pelvis was thoroughly irrigated and hemostasis  assured.  Following this, the drain was cut outside the skin level and  removed intact.  It was thoroughly irrigated and hemostasis assured.  The viscera returned to the anatomic position.  The midline fascia was  closed with looped #1 PDS begun at either end of the incision and tied  centrally.  The subcu was irrigated and the skin closed with staples.  Sponge, needle and instrument counts correct.  The patient was taken to  recovery in good condition.      Lorne Skeens. Hoxworth, M.D.  Electronically Signed     BTH/MEDQ  D:  09/17/2008  T:  09/17/2008  Job:  [id number]

## 2010-06-30 NOTE — Op Note (Signed)
NAMEKYLIYAH, [name]          ACCOUNT NO.:  [id number]   MEDICAL RECORD NO.:  [id number]          PATIENT TYPE:  AMB   LOCATION:  SDS                          FACILITY:  [facility]   PHYSICIAN:  Balinda Quails, M.D.    DATE OF BIRTH:  07-02-1945   DATE OF PROCEDURE:  03/17/2007  DATE OF DISCHARGE:                               OPERATIVE REPORT   PHYSICIAN:  Balinda Quails, M.D.   DIAGNOSIS:  Ischemic rest pain, right foot.   PROCEDURES:  1. Selective right lower extremity arteriogram.  2. Right anterior tibial percutaneous transluminal angioplasty.  3. Right popliteal/superficial femoral artery percutaneous      transluminal angioplasty.  4. Retrograde left lower extremity arteriogram.   ACCESS:  Left common femoral artery 6-French sheath.   CONTRAST:  130 mL Visipaque.   COMPLICATIONS:  None apparent.   CLINICAL NOTE:  Paula [name] is a 65 year old obese female with a  history of advanced peripheral vascular disease.  She has previously  undergone a left femoral-popliteal bypass for limb salvage.  She has had  extensive percutaneous intervention of the right lower extremity for the  treatment of ischemic rest pain.  She has developed recurrent ischemic  rest pain, in the right foot, and workup for this revealed evidence of  restenosis of the right superficial femoral and popliteal arteries.  She  is brought to the catheterization lab at this time for diagnostic  arteriography and possible re-intervention percutaneously.   PROCEDURE NOTE:  The patient brought to the catheterization lab in  stable condition.  Placed in supine position.  Left groin prepped and  draped in a sterile fashion.  Received fentanyl 50 mcg intravenously and  Versed 1 mg intravenously.   The left groin was then instilled with 1% Xylocaine.  An 18-gauge needle  introduced in the left common femoral artery.  A 0.035 Amplatz super  stiff guidewire was advanced up into the abdominal aorta under  fluoroscopy.  The needle removed.  Predilatation carried out with 6 and  7 dilators.  A 6-French Terumo sheath was then advanced over the  guidewire into the left common iliac artery.  A crossover catheter then  advanced over the guidewire, engaged into the right common iliac artery,  and the Amplatz super stiff guidewire advanced down into the right  external iliac artery.  The crossover catheter removed, the dilator  reinserted through the Terumo sheath and the Terumo sheath advanced into  the right external iliac artery.   A right lower extremity arteriogram was then obtained with injection  through the sheath.  This revealed patency of the right external iliac  and common femoral arteries.  The right profunda femoris artery was also  patent.  The proximal right superficial femoral artery was patent.  There was an area of restenosis of the right superficial femoral artery  within the stented segment from the mid thigh extending down into the  popliteal artery.  There was subtotal occlusion within the restenosis in  the stent area at the adductor canal and proximal right popliteal  artery.  There was subtotal occlusion in the mid right popliteal artery.  The stented segment revealed extensive pseudointimal narrowing.  The  right anterior tibial artery origin was subtotally occluded and there  was reconstitution via collaterals of the anterior tibial artery just  beyond its origin.  The anterior tibial artery was then patent down with  flow into the foot.   Peak-hold images of the popliteal artery and origin of the anterior  tibial were then obtained.  This verified a subtotal occlusion of the  distal superficial femoral artery, popliteal artery and origin of the  right anterior tibial artery.  A angled glide Glidewire and 135-cm quick-  cross catheter were then advanced through the sheath.  Under fluoroscopy  the Glidewire was advanced down through the right superficial femoral   artery, across the subtotal occluded areas in the right superficial  femoral artery, across the restenosis and subtotal occlusion of the  right popliteal artery and into the origin of the right anterior tibial  artery.  The Glidewire was then advanced down across the right anterior  tibial stent and into the anterior tibial artery beyond the restenotic  area.  The quick-cross catheter was then advanced down into the right  anterior tibial artery.  Injection through the quick-cross catheter  verified intraluminal position and patency of the right anterior tibial  artery down into the foot.   The patient administered a total of 6000 units of heparin intravenously.  ACT measured 290 seconds.   The access to the right anterior tibial artery was then reestablished  with a 0.14 Whisper guidewire.   A right anterior tibial angioplasty was then carried out with a 4 x 60  Savvy balloon at 8 atmospheres x60 seconds.  This was then drawn back  into the popliteal artery and similarly inflated at 8 atmospheres x60  seconds.  The Savvy balloon then removed and a 6 x 100 Sterling balloon  advanced over guidewire and angioplasty of the right popliteal and right  superficial femoral artery was carried out with the Sterling balloon at  12 atmospheres x60 seconds with four separate inflations.   Following this, the balloon was removed.  A completion arteriogram  obtained of the right lower extremity.  This revealed an excellent  technical result, recanalization of the subtotally-occluded segments  with brisk flow down to the right superficial, femoral, popliteal and  anterior tibial arteries.  No apparent complications.   The guidewire and balloon were then removed.  The Amplatz super stiff  guidewire advanced through the sheath, the dilator reinserted and the  sheath drawn back into the left external iliac artery.   A left lower extremity arteriogram was then obtained with retrograde  injection  through the left femoral sheath.  This revealed a patent left  external iliac artery.  The left common femoral artery was also patent.  The left profunda femoris artery was widely patent.  The proximal left  femoral-popliteal bypass vein graft was patent.  A tortuous segment  noted in the left thigh down to an area of moderate stenosis in the mid  thigh of the vein graft.  This did not appear to be hemodynamically  significant.  The graft was then patent down to the anastomosis to the  left popliteal artery, which was also widely patent.  Intact three-  vessel runoff in the left lower extremity was present, dominant runoff  being the left posterior tibial artery.   This completed the procedure.  The patient transferred to the holding  area with no apparent complications.   FINAL IMPRESSION:  1. Recurrent ischemic rest pain, right lower extremity, secondary to      subtotal occlusion of the right superficial      femoral, popliteal and anterior tibial arteries.  2. Successful angioplasty, right anterior tibial, popliteal and      superficial femoral arteries, without complication.  3. Patent left femoral-popliteal bypass with mild to moderate vein      graft stenosis in the mid thigh.      Balinda Quails, M.D.  Electronically Signed     PGH/MEDQ  D:  03/17/2007  T:  03/17/2007  Job:  [id number]

## 2010-06-30 NOTE — Discharge Summary (Signed)
Paula [name], Paula [name]          ACCOUNT NO.:  [id number]   MEDICAL RECORD NO.:  [id number]          PATIENT TYPE:  INP   LOCATION:  5123                         FACILITY:  [facility]   PHYSICIAN:  Almond Lint, MD       DATE OF BIRTH:  1946-01-10   DATE OF ADMISSION:  07/16/2008  DATE OF DISCHARGE:  07/25/2008                               DISCHARGE SUMMARY   ADMITTING PHYSICIAN:  Ollen Gross. Vernell Morgans, MD   DISCHARGING PHYSICIAN:  Almond Lint, MD   PRIMARY SURGEON:  Amber L. Freida Busman, MD   VASCULAR SURGEON:  Balinda Quails, MD   CHIEF COMPLAINT/REASON FOR ADMISSION:  Ms. Paula [name] is a 65 year old  female patient, who had recently been hospitalized with diverticular  abscess and has followed up with Dr. Freida Busman in the office.  She had been  treated with antibiotics and percutaneous drainage of the abscess, was  on antibiotics for 10 days, and the drain was removed 2 weeks prior to  this admission.  Twenty four hours prior to this admission, she began  having left lower quadrant abdominal pain, fever up to 100.3, so she  presented to the ER.  Upon arrival to the ER, the patient's temperature  had spiked to 103.3.  Her abdomen was soft with mild left lower quadrant  tenderness, but without guarding or signs of peritonitis.  Her white  count was 7500 and hemoglobin 9.8.  CT was reviewed by Dr. Carolynne Edouard and the  radiologist and showed recurrence of diverticular abscess with  inflammatory changes of the colon consistent with acute sigmoid  diverticulitis.  The patient was admitted for further treatment of  recurrent diverticular abscess.   ADMITTING DIAGNOSES:  1. Recurrent diverticular abscess with acute diverticulitis.  2. Leukocytosis and fever.  3. Mild anemia.  4. Known peripheral vascular disease with claudication, on Plavix.  5. Nonhealing ulcer, right foot secondary to vascular disease.  6. Hypertension.  7. Dyslipidemia.  8. History of arrhythmia.   HOSPITAL COURSE:  The patient was  admitted, placed empirically on Zosyn  and bowel rest.  On the morning that I evaluated her hospital day 1,  July 17, 2008, she was having significant left lower quadrant abdominal  pain and nausea.  Her temperature had decreased to 100.1.  Her vital  signs were otherwise stable that she was tachycardic, after hydration  her hemoglobin had drifted down to 8.7.  Her white count was 8200.  Her  bowel sounds were present.  IR was planning to evaluate the patient for  drain placement.  She was continued on bowel rest and IV narcotics.  She  subsequently had a 14-French left lower quadrant abscess drain placed on  July 17, 2008.  On the morning of July 18, 2008, about 6:45 in the  morning, on-call physician for surgery had been called, and the  patient's hemoglobin had decreased to 7.7.  The patient had remained on  Plavix, her blood pressure was down in the 80s, and increased somewhat  with fluid challenges.  Orders were given to transfer the patient to the  Step-Down Unit and transfuse 2 units of packed  red blood cells.  It was  felt that the patient probably had a combination of decreased hemoglobin  from acute blood loss anemia post drain as well as hydration.   Over the next several days, the patient continued to improve.  Within 24  hours, her blood pressure had stabilized and she was able to be  transferred out to Step-Down Unit.  She had minimal serosanguineous, no  frank blood out of the JP drain from the drain placement, and otherwise  was doing well.  Her diet was slowly advanced and by date of discharge,  she was on a low-residue diet.  Her abscess cultures from placement of  the drain showed moderate microaerophilic strep.  Blood cultures were  negative.  She was restarted on her usual home medications initially  without hydrochlorothiazide.  When she developed lower extremity edema  and increasing foot pain, hydrochlorothiazide was resumed, the dose was  doubled for 2 days.  She did  develop hypokalemia and this was treated  with oral repletion of potassium.  The patient had a followup CT scan  done on July 22, 2008, which revealed an improvement from prior study and  fluid collection left pelvis, it measured 3.8 x 3.8 cm, and was adjacent  to the pigtail catheter.  There was question on the part of the  radiologist whether this was a loculation of fluid separate from the  catheter or could be a pocket of undrained fluid within the abscess.  After discussion with other people in the Radiology Department and our  surgeon, it was felt that just to leave the catheter in place and not  try to advance that especially given the fact the patient had normal  white count and fevers had resolved.   By July 24, 2008, the patient was changed from IV Zosyn to oral Cipro  with plans to send the patient home on oral Cipro for a total of 4 weeks  and followup CT in 2 weeks.  Hopefully, discharge home on July 25, 2008.   The patient is planned to have an elective vascular procedure done by  Dr. Madilyn Fireman, this was some sort of new Rotablator-type endovascular  procedure that has specific equipment sent over alone to our vascular  department.  I have not been able to talk with Dr. Madilyn Fireman yet regarding  whether he wants to keep the patient in the hospital and do this while  she is here or plan on doing this electively in the next 4 weeks before  she has her surgery to treat her diverticular disease.  I will attempt  to call him and find a definite plan of care regarding this situation.   FINAL DISCHARGE DIAGNOSES:  1. Abdominal pain, fever, and leukocytosis secondary to recurrent left      sigmoid diverticular abscess.  2. Status post percutaneous drain for diverticular abscess.  3. Acute blood loss anemia multifactorial secondary to hydration as      well as post drain suspected bleeding ooze from being on Plavix.  4. Known peripheral vascular disease with chronic foot wound, possible       endovascular procedure pending may be this admission.  5. Dyslipidemia.  6. Hypertension, controlled.  7. Diabetes.   DISCHARGE MEDICATIONS:  The patient will resume the following home  medications:  1. Aspirin 325 mg daily.  2. Benicar HCTZ 20/12.5 daily.  3. Metoprolol 25 mg b.i.d.  4. Plavix 75 mg daily.  5. Vytorin 10/40 daily.  6. Metformin 500  mg daily.  7. Vicodin 5/325 one to two tabs every 4 hours as needed for pain.      Complete the current prescription you have and I have given you a      new prescription with #40 dispense 0 refills.   NEW MEDICATIONS:  1. Cipro 500 mg b.i.d. for 4 weeks total, the prescription has a 2-      week quantity on it with 1 refill.  2. Flagyl 500 mg t.i.d. for 4 weeks, prescription 2-week quantity with      1 refill.   OTHER INSTRUCTIONS:  Return to work in 4 weeks or pending Dr. Eliot Ford  recommendations, note given.   ACTIVITY:  Increase activity slowly.  May walk up steps.  Because of the  drain, no lifting for at least 1 week.  No driving for 1 week while  using narcotics.  May shower.   DIET:  Low residue, carb modified if possible.   WOUND CARE:  Drain care per routine.  Recommendations from  Interventional Radiology and Home Health to follow.   OTHER RECOMMENDATIONS:  The patient will need to have a CT scan in 2  weeks after discharge.  We will try to arrange this before discharge.  This is all pending discussion with Dr. Madilyn Fireman.  In the event, Dr. Madilyn Fireman  needs to leave the patient in the hospital for several more days.  We  will not schedule followup CT until we have a definite discharge date.   FOLLOWUP APPOINTMENTS:  The patient is to call Dr. Freida Busman to be seen in 2  weeks after discharge.      Allison L. Rennis Harding, N.P.      Almond Lint, MD  Electronically Signed    ALE/MEDQ  D:  07/24/2008  T:  07/25/2008  Job:  [id number]   cc:   Lennie Muckle, MD  P. Liliane Bade, M.D.

## 2010-06-30 NOTE — Assessment & Plan Note (Signed)
OFFICE VISIT   CHRISTELL, STEINMILLER D.  DOB:  November 11, 1945                                       12/08/2006  EAVWU#:[id number]   The patient is status post reintervention of her right lower extremity  peripheral vascular disease.  On 11/04/2006, she underwent right lower  extremity arteriogram, right SFA PTA, right popliteal PTA, and right  anterior tibial PTA.  These were carried out for restenosis of stented  segments.  Excellent technical result obtained.   Subjectively, the patient describes reduced pain and improved exercise  tolerance in her right lower extremity.   BP is 133/84, pulse 72 per minute, and respirations were 18 per minute.  O2 sat 98%.   Feet examined, are well-perfused bilaterally.  No palpable pulses.  2+  edema left ankle.  No significant edema in the right lower extremity.   The patient is doing well following recent reintervention on her right  lower extremity.  We will plan followup again per routine protocol.   Balinda Quails, M.D.  Electronically Signed   PGH/MEDQ  D:  12/08/2006  T:  12/10/2006  Job:  [id number]

## 2010-06-30 NOTE — Assessment & Plan Note (Signed)
OFFICE VISIT   ARMYA, WESTERHOFF D  DOB:  March 04, 1945                                       10/14/2008  ZOXWR#:[id number]   REASON FOR VISIT:  Follow-up.   HISTORY:  This is a 65 year old female who is status post right femoral  to anterior tibial artery bypass on August 09, 2008, by Dr. Madilyn Fireman for a  small heel ulcer and rest pain.  She was initially seen in July for her  first postoperative visit, her incisions are healing nicely and her  graft is widely patent.  In the interim, she has undergone diverticular  surgery for abscess.  She has had a sigmoid colectomy.  She is now back  on her Plavix and aspirin.  She has developed a wound at the distal vein  harvest site, it measures approximately a centimeter and a centimeter  deep, the vein is not visualized.  Within the wound there is healthy  granulation tissue present.  There is no evidence of infection.  There  is mild drainage.  I have recommended that we proceed with packing of  this wound.  She is going to do wet-to-dry dressing changes twice a day  and I am going to see her back in a month to see how this is healing.  She has a palpable pulse in her graft and she will be put on our graft  surveillance protocol.   Jorge Ny, MD  Electronically Signed   VWB/MEDQ  D:  10/14/2008  T:  10/15/2008  Job:  [id number]

## 2010-07-03 NOTE — Op Note (Signed)
Paula [name], Paula [name]          ACCOUNT NO.:  [id number]   MEDICAL RECORD NO.:  [id number]          PATIENT TYPE:  INP   LOCATION:  6525                         FACILITY:  [facility]   PHYSICIAN:  Balinda Quails, M.D.    DATE OF BIRTH:  11/15/1945   DATE OF PROCEDURE:  07/09/2005  DATE OF DISCHARGE:                                 OPERATIVE REPORT   PHYSICIAN:  Denman George, M.D.   ASSISTANT:  Randa Evens, M.D.   DIAGNOSIS:  Right lower extremity claudication.   PROCEDURES:  1.  Right lower extremity arteriogram.  2.  Right popliteal and superficial femoral artery atherectomy with the      Silver [facility] LS device.  3.  Deployment of Spider FX 3.0 mm protection device.   ACCESS:  Right common femoral artery 7-French sheath.   CONTRAST:  170 mL Visipaque.   COMPLICATIONS:  None apparent.   CLINICAL NOTE:  Paula [name] is a 65 year old female with advanced  peripheral vascular disease.  Previously undergone a left femoral-popliteal  bypass for limb salvage.  She has extensive disease of the right popliteal  and superficial femoral artery with single-vessel anterior tibial runoff.  She is brought to the catheterization lab at this time for atherectomy.  Distal protection with a Spider FX device will be used to protect her single  anterior tibial runoff vessel.   PROCEDURE NOTE:  The patient left the catheterization lab in stable  condition.  Placed in supine position.  Administered 1 mg of Versed and 25  mcg fentanyl intravenously.  Skin and subcutaneous tissue of the right groin  instilled with 1% Xylocaine.  An antegrade stick made in the right common  femoral artery.  A 0.035 Wholey guidewire advanced down into the right  superficial femoral artery.  A 5-French sheath then advanced over the  guidewire.  Dilator removed, the sheath flushed with heparin and saline  solution.  An initial right lower extremity arteriogram obtained.  This  revealed two distinct areas of  disease in the mid-right superficial femoral  artery and in the distal right superficial femoral artery-popliteal area.  Single-vessel anterior tibial runoff was present.   A Rosen guidewire was then advanced down into the right superficial femoral  artery.  The 5-French sheath removed and a 7-French sheath advanced over the  guidewire.  The dilator removed and the sheath flushed with heparin and  saline solution.  The patient the received a bolus dose of Angiomax and  Angiomax infusion.  ACT measured 355 seconds.  Continued Angiomax infusion  carried out throughout the procedure.   A 0.014 stabilizer guidewire was then advanced along the right superficial  femoral artery, across the popliteal and into the right anterior tibial  artery.  A Spider FX device 3 mm was then advanced over the guidewire and  deployed in the proximal right anterior tibial artery.   The Silver [facility] LS device was then advanced over the guidewire.  The most  distal lesion at the distal right superficial femoral popliteal-junction was  treated with the Silver [facility] device for a series of passes totaling eight.  After three  passes, three passes, and two passes, the device was cleaned.   Completion arteriogram revealed excellent technical result.  No evidence of  dissection.  This was then drawn back to the more proximal lesion in the mid  right superficial femoral artery.  A total of four passes with the LS device  were made at that time.  Again the device cleaned after two passes and two  passes, respectively.   A completion arteriogram was obtained.  Excellent technical result present.  There was spasm noted at the deployment site of the Spider device.  The  patient administered 200 mcg of nitroglycerin intravenously.  The retrieval  catheter was then advanced over the guidewire and the Spider device was  retrieved.  Minimal debris was present.  Completion arteriogram revealed  resolution of the spasm in the  right anterior tibial artery.   The Angiomax was then discontinued.  The right femoral sheath removed.  No  apparent complications.  The patient transferred to the holding area in  stable condition.   FINAL IMPRESSION:  1.  Right superficial femoral artery and popliteal atherosclerotic      peripheral vascular disease with right lower extremity claudication.  2.  Successful atherectomy without apparent complication, distal right      superficial femoral artery, proximal popliteal artery and mid right      superficial femoral artery.   DISPOSITION:  The patient will continue be observed overnight and  transferred plan discharge tomorrow.      Balinda Quails, M.D.  Electronically Signed     PGH/MEDQ  D:  07/09/2005  T:  07/09/2005  Job:  [id number]   cc:   [facility]

## 2010-07-03 NOTE — Discharge Summary (Signed)
Paula [name], Paula [name]          ACCOUNT NO.:  [id number]   MEDICAL RECORD NO.:  [id number]          PATIENT TYPE:  INP   LOCATION:  2007                         FACILITY:  [facility]   PHYSICIAN:  Balinda [name], M.D.    DATE OF BIRTH:  May 25, 1945   DATE OF ADMISSION:  03/03/2005  DATE OF DISCHARGE:  03/05/2005                                 DISCHARGE SUMMARY   ADMISSION DIAGNOSIS:  Ischemic rest pain, left foot.   DISCHARGE DIAGNOSIS:  1.  Ischemic rest pain, left foot, status post left femoral popliteal bypass      graft.  2.  Hyperlipidemia.  3.  Hypertriglyceridemia.  4.  Gastroesophageal reflux disease.  5.  Peripheral vascular occlusive disease status post left superficial      femoral artery percutaneous transluminal angioplasty and stenting in      2005.  6.  Bilateral carpal tunnel release in 1989 and 1991.  7.  Tubal ligation in 1999.  8.  Right posterior tibial tendon repair.  9.  Tonsillectomy in 1955.  10. Allergic to codeine which causes nausea.   PROCEDURE:  March 03, 2005, left femoral popliteal bypass graft using  reversed saphenous vein, surgeon Paula [name].   BRIEF HISTORY:  Paula [name] is a 65 year old Caucasian female with known  peripheral vascular disease.  Approximately two years ago, she underwent  left superficial femoral artery stenting for limiting claudication.  Since  that time, she has developed recurrent claudications in the left lower  extremity and has now developed rest pain with some superficial ulcers in  her left foot.  Arteriography revealed no areas of significant stenosis of  the right superficial femoral artery with extensive disease in the left  superficial femoral artery and occlusion of the left superficial femoral  artery at the adductor canal with three vessel tibial run off on the left.  Subsequently, Paula [name] recommended that she undergo left femoral popliteal  bypass grafting.   HOSPITAL COURSE:  Paula [name] was  electively admitted to [facility] on March 03, 2005, and taken to the operating room for left  femoral popliteal bypass graft.  There were no known interoperative  complications and after a short stay in the recovery room, was transferred  to step down unit 3300 with routine peripheral bypass postoperative orders.  On the postoperative day one, she remained stable overnight.  Her left foot  was warm and well perfused.  Postoperative ABIs showed monophasic anterior  tibial and posterior tibial Doppler signals with an ABI of 0.62 on the left  and 0.65 on the right which indicated a moderate decrease in flow but  increase from her preoperative Dopplers.  Postoperative labs were stable  showing a white blood count of 4.4, hemoglobin 9.5, hematocrit 28.3,  platelet count 169, sodium 138, potassium 3.3 which was supplemented, BUN 6,  creatinine 0.8.  Blood glucose of 215 which was thought most likely related  to the dextrose she was receiving IV as well as stress of hospitalization  and surgery as her preoperative glucose was normal at 92.  Jackson-Pratt  drain which was placed interoperatively  was also removed due to minimal  drainage.  The Foley catheter was also removed and orders were written for  her to be transferred to telemetry unit 2000.  It is felt that if she makes  good progress with mobilization over the next 24 hours and there are no  significant changes in her status, that she will be ready for discharge home  on postoperative day two or three, March 05, 2005, or March 06, 2005.   DISCHARGE MEDICATIONS:  1.  Plavix 75 mg p.o. daily.  2.  Aspirin 325 mg 1 p.o. daily.  3.  Tylox 1-2 tablets p.o. q.4h. p.r.n. pain.   DISCHARGE INSTRUCTIONS:  She is instructed to follow a low fat, low salt  diet.  She is to avoid driving or heavy lifting for the next three weeks.  She is to increase her activity slowly.  She should walk with assistance as  needed.  Depending on her  progress and mobilization, it will be determined  if she would benefit from either ability equipment or home health physical  therapy prior to discharge and this will be arranged if felt appropriate.  She may shower and clean her incisions gently with mild soap and water.  She  should notify the CVTS office if she develops unexplained fever greater than  101 or redness and drainage from her incision sites or increasing pain.  She  is to  follow up at the CVTS office with Paula [name] on March 25, 2005, at  10 a.m.  She has ABIs and lower extremity Doppler studies as well at this  appointment.  She is to go to the CVTS office on March 17, 2005, at 9 a.m.  for staple removal.      Paula Coombe, P.A.      Balinda [name], M.D.  Electronically Signed    AWZ/MEDQ  D:  03/04/2005  T:  03/04/2005  Job:  [id number]   cc:   Paula Guise., M.D.  Fax: [phone]   Paula [name]  Fax: [phone]

## 2010-07-03 NOTE — H&P (Signed)
Paula [name], Paula [name]          ACCOUNT NO.:  [id number]   MEDICAL RECORD NO.:  [id number]          PATIENT TYPE:  INP   LOCATION:  NA                           FACILITY:  [facility]   PHYSICIAN:  Janetta Hora. Fields, MD  DATE OF BIRTH:  06/09/1945   DATE OF ADMISSION:  11/19/2005  DATE OF DISCHARGE:                                HISTORY & PHYSICAL   REASON FOR ADMISSION:  Cellulitis, left leg.   HISTORY OF PRESENT ILLNESS:  Patient is a 65 year old female with an  approximately six day history of pain, swelling, redness, and fever.  She  felt a pop in her left knee and calf area approximately six days ago.  She  then began to develop redness in the knee 24 hours later.  She has had  spiking fevers up to 102 intermittently over the past few days.  She has  been on Keflex p.o. for the last five days with no significant improvement.  She still has significant swelling and redness in the leg.  Patient has had  multiple revascularization procedures in the left leg by Dr. Madilyn Fireman.  She had  a left fem-pop bypass in January, 2007.  She then had a vein patch repair of  her fem-pop bypass in May, 2007.  She had been doing well prior to this.  She does not describe claudication symptoms or symptoms of ischemia.  She  denies rest pain.   PAST MEDICAL HISTORY:  As above.  In addition, she has a history of  hypertension, hyperlipidemia, constipation, migraine headaches, and reflux.  She has had previous cellulitis in her leg in January, 2007.   PAST SURGICAL HISTORY:  Remarkable for tonsillectomy, carpal tunnel repair,  and a right posterior tibial tendon repair.   She has a side effect of severe nausea and vomiting with CODEINE.  Patient  can take hydrocodone but not OXYCODONE.   MEDICATIONS:  1. Aspirin 325 mg once daily.  2. Plavix 75 mg once daily.  3. Metoprolol 25 mg twice daily.  4. Vytorin 10/20 mg once daily.  5. Benicar 20 mg/12.5 once daily.  6. She is also currently on Keflex 500  mg t.i.d.   SOCIAL HISTORY:  She is married.  Has one child.  She does not use tobacco  or alcohol.  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   PHYSICAL EXAMINATION:  VITAL SIGNS:  Blood pressure is 124/84, heart rate  92.  HEENT:  Unremarkable.  NECK:  Supple.  EXTREMITIES:  Left lower extremity shows an area of approximately 7 cm in  diameter on the anteromedial leg just below the knee on the left side.  There is also significant edema which is tense and shiny in appearance with  some pitting.  The left foot is well perfused.  There is no palpable pulse.  The leg is very swollen, and I am unable to palpate a popliteal pulse  otherwise.  Her incisions are well healed.  There is no drainage from the  incisions.   ASSESSMENT:  Cellulitis, left leg, which has failed oral antibiotic course.  We will admit her for  IV antibiotics consisting of nafcillin.  This may need  to be changed to vancomycin if not immediately responsive, as she is  certainly at risk for having MRSA, being a hospital employee.  Additionally,  she has a history in the past of having some elevated glucoses during her  previous hospital admission.  She denies a history of diabetes, but we will  check her CBGs and also order a hemoglobin A1C, since she has had several  episodes of cellulitis and seems to be prone to this.  In May, 2007, she had  glucoses in the hospital elevated as high as 210.      Janetta Hora. Fields, MD  Electronically Signed     CEF/MEDQ  D:  11/19/2005  T:  11/19/2005  Job:  [id number]

## 2010-07-03 NOTE — Op Note (Signed)
Paula [name], Paula [name]          ACCOUNT NO.:  [id number]   MEDICAL RECORD NO.:  [id number]          PATIENT TYPE:  AMB   LOCATION:  SDS                          FACILITY:  [facility]   PHYSICIAN:  Balinda Quails, M.D.    DATE OF BIRTH:  03/23/1945   DATE OF PROCEDURE:  06/25/2005  DATE OF DISCHARGE:  06/25/2005                                 OPERATIVE REPORT   PHYSICIAN:  Balinda Quails, M.D.   DIAGNOSIS:  1.  Vein graft stenosis of left femoral-popliteal bypass.  2.  Right superficial femoral artery popliteal occlusive disease.   PROCEDURE:  1.  Abdominal aortogram.  2.  Bilateral selective lower extremity arteriogram.   ACCESS:  Right common femoral artery 5-French sheath.   CONTRAST:  14 mL Visipaque.   COMPLICATIONS:  None apparent.   CLINICAL NOTE:  Paula [name] is a 65 year old female status post left  femoral popliteal bypass for limb salvage.  She has diffuse infrainguinal  peripheral vascular disease.  Recent Doppler evaluation revealed evidence of  vein graft stenosis in her left femoral popliteal graft.  She is brought to  the cath lab at this time for diagnostic arteriography.   PROCEDURE NOTE:  The patient was brought to the cath lab in stable  condition.  She was placed in supine position.  Both groins were prepped and  draped in sterile fashion.  The skin and subcutaneous tissue of the right  groin was instilled with 1% Xylocaine.  The patient was administered 50 mg  protamine intravenously.  The right common femoral artery accessed with an  18 gauge needle.  A 0.035 Wholey guidewire was advanced through the needle  into the mid abdominal aorta.  The needle was removed and a 5-French sheath  advanced over the guidewire.  The dilator was removed and the sheath flushed  with heparin saline solution.  Standard pigtail catheter was advanced over  the guidewire to the mid abdominal aorta.   Standard mid abdominal AP aortogram obtained.  This revealed single  widely  patent bilateral renal arteries.  The infrarenal aorta was widely patent.  The common iliac arteries were normal.  The hypogastric and external iliac  arteries were widely patent.   The pigtail catheter brought down to the aortic bifurcation.  Lower  extremity runoff arteriography was obtained.  This revealed free flow to the  common femoral level bilaterally to be normal.  From the left common femoral  artery arose a saphenous vein graft.  This was a femoral popliteal graft.  The left profunda femoris artery was widely patent.   Due to brisk flow, selective lower extremity arteriography was obtained.  The pigtail catheter was hooked on the aortic bifurcation.  A 0.035 angled  Glidewire was passed down into the left external iliac artery.  The pigtail  catheter was removed and an Indole catheter advanced over the guidewire into  the left external iliac artery.  Left extremity arteriography was obtained.  There was a high grade stenosis at the origin of the left femoral popliteal  vein graft.  The remainder of the vein graft was patent.  The distal  anastomosis was widely patent.  There was three-vessel runoff.   The guidewire was reinserted and the pigtail catheter removed.  Retrograde  injection was then made through the right femoral sheath.  The right  superficial femoral artery was patent although there were multiple areas of  moderate to severe stenosis.  The right popliteal artery was also patent and  this was also moderately to severely diseased.  Single-vessel anterior  tibial runoff to the foot.   This completed the arteriogram procedure.  The right femoral sheath was  removed.  There were no apparent complications.  The patient was transferred  to the holding area in stable condition.   FINAL IMPRESSION:  1.  Widely patent single renal arteries bilaterally.  2.  Normal aortoiliac segment.  3.  Patent left femoral popliteal bypass with a high-grade proximal       stenosis.  4.  Diffuse right superficial femoral popliteal occlusive disease moderate      to severe in nature.   DISPOSITION:  These results have been reviewed with the patient.  The  patient will undergo repair of the left femoral popliteal vein graft  stenosis with an operative procedure.  The right leg will be treated with  planned atherectomy.      Balinda Quails, M.D.  Electronically Signed     PGH/MEDQ  D:  06/25/2005  T:  06/25/2005  Job:  [id number]   cc:   Gwen Pounds, MD  Fax: [phone]

## 2010-07-03 NOTE — Op Note (Signed)
NAMECLOTILDA, Paula [name]          ACCOUNT NO.:  [id number]   MEDICAL RECORD NO.:  [id number]          PATIENT TYPE:  INP   LOCATION:  2029                         FACILITY:  [facility]   PHYSICIAN:  Balinda Quails, M.D.    DATE OF BIRTH:  [birth year]   DATE OF PROCEDURE:  07/07/2005  DATE OF DISCHARGE:                                 OPERATIVE REPORT   SURGEON:  Balinda Quails, M.D.   ASSISTANT:  Pecola Leisure, P.A.-C.   ANESTHETIC:  General endotracheal.   ANESTHESIOLOGIST:  Maren Beach, M.D.   PREOPERATIVE DIAGNOSIS:  Vein graft stenosis left femoral popliteal bypass.   POSTOPERATIVE DIAGNOSIS:  Vein graft stenosis left femoral popliteal bypass.   PROCEDURE:  Repair of vein graft stenosis left femoral popliteal bypass with  patch angioplasty (endarterectomized segment of superficial femoral artery).   CLINICAL NOTE:  Paula [name] is a 65 year old female with advanced of  peripheral vascular disease.  She had previously undergone left femoral  popliteal bypass for ischemic rest pain.  Follow up surveillance revealed a  high grade stenosis at the origin of the vein graft.  This was a reversed  vein graft.  An arteriogram verified a focal high grade stenosis at the  anastomosis.  She is brought to the operating room at this time for repair.   OPERATIVE PROCEDURE:  The patient was brought to the operating room in  stable hemodynamic condition.  She was placed in the supine position.  The  left leg was prepped and draped in a sterile fashion.  General endotracheal  anesthesia was induced.   A skin incision was made through the scar in the left groin.  Dissection was  carried down through the subcutaneous tissue with electrocautery.  Lymphatics were divided.  The arterial anastomosis was exposed.  The vein  graft was mobilized and encircled with a vessel loop.  The vein graft  remained patent.  The common femoral artery was mobilized proximally and  encircled with a  vessel loop.  The origin of the profunda femoris artery was  freed and encircled with a vessel loop.  The superficial femoral artery  proximally was patent, however, distally was occluded.  The proximal segment  of the superficial femoral artery was spared.  Through the groin incision,  the severely diseased segment of superficial femoral artery was dissected  out.  This was ligated proximally and distally with 2-0 silk and divided.  It was opened longitudinally and endarterectomized.  This left a nice  segment of endarterectomized superficial femoral artery for patch  angioplasty.   The patient was administered 7000 units heparin intravenously.  Adequate  circulation time permitted.  The femoral vessels were controlled with  clamps.  The common femoral artery was opened through a longitudinal  arteriotomy.  It extended out onto the vein graft.  There was pseudointima  hyperplasia and a hypertrophied valve cusp.  This was endarterectomized.  Patch angioplasty with the endarterectomized superficial femoral artery  segment was then carried out using running 6-0 Prolene suture.  Clamps were  removed.  Excellent flow was present.  Adequate hemostasis obtained.  The  sponge  and instrument counts were correct.   The patient was administered 50 mg protamine intravenously.  The groin wound  was closed with a deep layer of interrupted 2-0 Vicryl suture.  A  superficial layer of running 2-0 Vicryl suture.  The skin was closed with 4-  0 Monocryl.  Steri-Strips were applied.   The patient tolerated the procedure well.  No apparent complications.  She  was transferred to the recovery room in stable condition.      Balinda Quails, M.D.  Electronically Signed     PGH/MEDQ  D:  07/07/2005  T:  07/07/2005  Job:  [id number]

## 2010-07-03 NOTE — Op Note (Signed)
Paula [name], Paula [name]          ACCOUNT NO.:  [id number]   MEDICAL RECORD NO.:  [id number]           PATIENT TYPE:   LOCATION:                                 FACILITY:   PHYSICIAN:  Balinda Quails, M.D.         DATE OF BIRTH:   DATE OF PROCEDURE:  03/03/2005  DATE OF DISCHARGE:                                 OPERATIVE REPORT   SURGEON:  Balinda Quails, M.D.   ASSISTANT:  Rowe Clack, P.A.-C.   ANESTHETIC:  General endotracheal.   ANESTHESIOLOGIST:  Germaine Pomfret, M.D.   PREOPERATIVE DIAGNOSIS:  Ischemic rest pain left foot.   POSTOPERATIVE DIAGNOSIS:  Ischemic rest pain left foot.   PROCEDURE:  Left femoral popliteal bypass with reverse saphenous vein graft.   CLINICAL NOTE:  Paula [name] is a 65 year old female with a known  peripheral vascular disease. Approximately 2 years ago she underwent left  superficial femoral artery stenting for limiting claudication. Since that  time her symptoms have worsened with ischemic rest pain and some superficial  ulcers in her left foot. Arteriography revealed occlusion of her left side  superficial femoral artery stent; intact tibial runoff. Brought to the  operating room, at this time, for revascularization for ischemic rest pain.   OPERATIVE PROCEDURE:  The patient brought to the operating room in stable  condition. Placed in supine position. General endotracheal anesthesia  induced. Left leg prepped and draped in sterile fashion.  Oblique skin  incision made through the left groin. Dissection carried down through  subcutaneous tissue. The saphenofemoral junction exposed. Tributaries of  saphenous vein ligated with 3-0 silk and divided. Separated longitudinal  skin incision was then made throughout the course of the left thigh. The  vein exposed through these incisions with tributaries controlled with 4-0  and 3-0 silk. The vein exposed down to the level of the mid calf.  Tributaries ligated with the vein fully  mobilized.   Through a medial approach, the popliteal fossa entered. The left popliteal  artery identified. This was small in caliber; moderately diseased with  plaque; patent; the artery encircled with vessel loops.  In the groin  incision, the femoral vessels were then exposed. The superficial femoral  artery mobilized and encircled with a vessel loop. The origin of the  profunda femoris artery was encircled with a vessel loop. The left common  femoral artery also controlled with a vessel loop.   A subsartorial tunnel was then created. This was tunneled between the heads  of the gastrocnemius muscle proximally up to the groin. The vein was then  removed; ligated proximally and distally with 2-0 silk; cannulated in  reverse fashion; dilated with heparin saline solution. The vein was small in  caliber, 3 mm extending to 4 mm, relatively uniform in size. The femoral  vessel is then controlled clamps. A longitudinal arteriotomy made in the  distal common femoral artery. This was extended out onto the superficial  femoral artery origin. The reverse vein was beveled and anastomosed end-to-  side with the common femoral artery using running 6-0 Prolene suture. At  completion of  the proximal anastomosis the vein was flushed. Excellent flow  was present to the vein. Despite its small caliber. The vein was then  brought down through the subsartorial tunnel. The popliteal artery  controlled proximally and distally with bulldog clamps; and a longitudinal  arteriotomy made. The popliteal artery was thick with plaque. The lumen was  patent. The vein was beveled and anastomosed end-to-side to the popliteal  artery using running 6-0 Prolene suture. At completion of this, clamps were  removed. Excellent flow present. Adequate hemostasis obtained. Sponge and  instruments counts correct.   The groin incision was then irrigated with saline solution. The groin closed  with a deep layer of interrupted 2-0  Vicryl suture, a subcutaneous layer of  running 2-0 Vicryl suture, staples applied to skin.  The vein harvest bed  was then drained with a 15 round Blake drain, exited inferior to the skin,  fixed to the skin with a 2-0 silk suture. The vein harvest incisions were  closed with running 2-0 Vicryl sutures and a single subcutaneous layer.  Staples applied to skin.  The popliteal incision was then closed with a  fascial layer of interrupted 2-0 Vicryl suture, subcutaneous layer of  running 2-0 Vicryl suture, staples applied to skin. Sterile dressings were  applied. The patient tolerated the procedure well. No apparent  complications. The patient transferred to recovery in stable condition.      Balinda Quails, M.D.  Electronically Signed     PGH/MEDQ  D:  03/03/2005  T:  03/03/2005  Job:  [id number]

## 2010-07-03 NOTE — Op Note (Signed)
Paula [name], Paula [name]          ACCOUNT NO.:  [id number]   MEDICAL RECORD NO.:  [id number]          PATIENT TYPE:  AMB   LOCATION:  SDS                          FACILITY:  [facility]   PHYSICIAN:  Balinda Quails, M.D.    DATE OF BIRTH:  Jun 22, 1945   DATE OF PROCEDURE:  05/20/2006  DATE OF DISCHARGE:                               OPERATIVE REPORT   DIAGNOSIS:  Ischemic right foot   PROCEDURES:  1. Right lower extremity arteriogram.  2. Right anterior tibial PTA/stent.  3. Right popliteal PTA/stent.  4. Right superficial femoral artery PTA/stent.   ACCESS:  Left common femoral artery 6-French sheath.   CONTRAST:  170 mL Visipaque.   COMPLICATIONS:  None apparent.   CLINICAL NOTE:  Paula [name] is a 65 year old female with a  history of advanced peripheral vascular disease.  She has previously  undergone left femoral-popliteal bypass for limb salvage.  She also has  previously undergone right superficial femoral artery atherectomy.  Most  recent arteriography has revealed occlusion of the right popliteal  artery, reconstitution of the proximal right anterior tibial artery.   [place] has developed a nonhealing ulcer on her right heel, and rest  pain in her right foot.  Ankle-brachial index is less than 0.3 on the  right.  She is brought to the catheterization lab at this time for a  diagnostic arteriography of the right lower extremity, possible  percutaneous intervention for reconstitution of occluded right popliteal  artery.   PROCEDURE NOTE:  The patient was brought to the catheterization lab in  stable condition.  Placed in supine position.  Left groin prepped and  draped in sterile fashion.  Skin and subcutaneous tissue instilled with  1% Xylocaine.  The patient was administered sedation throughout the  procedure, Versed and fentanyl intravenously.   The left common femoral artery was accessed with an 18-gauge needle.  A  0.035 Rosen guidewire was passed through the  needle into the  midabdominal aorta.  The needle was removed. A long 64 French Pinnacle  sheath was advanced over the guidewire.  The sheath was advanced up to  the aortic bifurcation.  The Rosen guidewire was removed.  A Wholey  guidewire was advanced through the sheath.  A crossover IMA catheter was  then advanced over the guidewire and engaged in the right common iliac  artery.  The Wholey guidewire was then advanced down into the right  common iliac artery.  The catheter was removed, dilator placed through  the sheath, and the sheath advanced over the aortic bifurcation to the  right external iliac artery.   Hand injection was then made through the sheath to delineate the anatomy  of the right common femoral artery and profunda and superficial femoral  artery origin.  This revealed a patent profunda femoris artery.  The  proximal right superficial femoral artery was also patent.  The Wholey  guidewire was then advanced down into the proximal right superficial  femoral artery.  A 4-French end-hole catheter was then advanced over the  guidewire into the proximal right superficial femoral artery.  Further  injection of contrast was then  made to delineate the right superficial  femoral artery pathology.  The proximal right superficial femoral artery  was patent, without significant stenosis.  In the midthigh, there was a  high-grade subtotal occlusion of the right superficial femoral artery.  The artery then reconstituted through a previously atherectomy segment.  A 0.035 soft Glidewire was then advanced through the end-hole catheter  and down along the course of the right superficial femoral artery.  This  was advanced down to the adductor canal.  Further contrast injection  then delineated the anatomy of the right popliteal artery.  The proximal  right popliteal artery was patent. There was an occlusion of the right  popliteal artery at the superior margin of the patella.  Two large   collateral vessels arose from the popliteal artery, and these provided  collateral flow to the right anterior tibial artery, which filled  retrograde back to its origin.   Using a roadmapping technique, the Glidewire was then advanced along the  occluded right popliteal artery down to and adjacent to the origin of  the right anterior tibial artery.  An end-hole support catheter was used  to aid with tunneling.  The end-hole catheter was then left in the  popliteal, and a 0.014 [employer] 6.0 guidewire was advanced  through the catheter.  This was then used to enter the lumen of the  right anterior tibial artery.   Once the guidewire entered the right anterior tibial artery, a  completion arteriogram verified intraluminal position.   Initial ballooning of the occluded popliteal artery was carried out with  a 2.5 x 120 Amphirion Deep balloon at 7 atmospheres x60 seconds for two  inflations.   The right popliteal artery was then stented with an Xceed 6 x 120 stent  from the origin of the anterior tibial artery proximally.  Following  this, an Endeavor 2.5 x 30 stent was then deployed across the origin of  the right anterior tibial artery, two balloon inflations, both 10  atmospheres for 30 seconds.   Further proximal stenting of the proximal popliteal and distal  superficial femoral artery was then carried out with a seven x 120 Xceed  stent extending across the origin of the popliteal and up into the  superficial femoral artery.  A third superficial femoral artery stent  was then deployed, a 7 x 120 in the right SFA, up to the proximal third  of the vessel.   Post-deployment balloon dilatation of the popliteal and superficial  femoral artery was then carried out.  A 4 x 60 Agiltrac balloon was  inflated in the popliteal artery, three separate inflations, 6, 7, and 8  atmospheres, each for 60 seconds.  The right superficial femoral artery was then angioplastied with a 6 x 80  Agiltrac balloon for a three  inflations, 6 atmospheres for 60 seconds, 6 atmospheres for 60 seconds,  and 8 atmospheres for 60 seconds.   Following post-deployment balloon inflation, a completion arteriogram  was obtained.  This verified excellent flow through the superficial  femoral artery, without significant dominant residual stenosis.  The  right anterior tibial artery remained widely patent.  There was brisk  flow through the right anterior tibial artery.  The proximal right  superficial femoral artery was also widely patent.   This completed the arteriogram procedure.  The Wholey guidewire was  reinserted and the sheath brought back into the left external iliac  artery.   The patient received a total of 7000 units of heparin  during the  procedure.  An ACT prior to intervention measured 316 seconds, 1 hour  after intervention 238 seconds, and at completion 236 seconds.   The patient tolerated the procedure well.  Transferred to the holding  area in stable condition.   FINAL IMPRESSION:  1. Complete occlusion right popliteal artery.  2. Successful recanalization of right popliteal artery, with stenting      of right anterior tibial artery, right popliteal artery, and right      superficial femoral artery.   DISPOSITION:  The patient will be discharged today, with followup on  Monday for completion ankle-brachial indices.      Balinda Quails, M.D.  Electronically Signed     PGH/MEDQ  D:  05/20/2006  T:  05/20/2006  Job:  [id number]   cc:   Gwen Pounds, MD

## 2010-07-03 NOTE — Op Note (Signed)
[name], [name]          ACCOUNT NO.:  [id number]   MEDICAL RECORD NO.:  [id number]          PATIENT TYPE:  AMB   LOCATION:  SDS                          FACILITY:  [facility]   PHYSICIAN:  Balinda Quails, M.D.    DATE OF BIRTH:  1945/07/21   DATE OF PROCEDURE:  04/15/2006  DATE OF DISCHARGE:  04/15/2006                               OPERATIVE REPORT   DIAGNOSIS:  Vein graft stenosis, left femoral-popliteal bypass graft.   PROCEDURE:  Bilateral selective lower extremity arteriograms.   ACCESS:  Right common femoral artery 6-French sheath.   CONTRAST:  160 mL Visipaque.   COMPLICATIONS:  None apparent.   CLINICAL NOTE:  Paula [name] is a 65 year old female with advanced  peripheral vascular disease.  Previous left femoral-popliteal bypass  with vein graft.  This has required revision for vein graft stenosis.  Recent surveillance revealed possible stenosis within the vein graft  which potentially threatens the patency of the graft.  The patient is  brought to the catheterization lab at this time for diagnostic  arteriography and possible intervention.   PROCEDURE NOTE:  The patient brought to catheterization lab in stable  condition.  Placed in a supine position.  The right groin prepped and  draped in sterile fashion.  Skin and subcutaneous tissue instilled with  1% Xylocaine.  The needle was easily introduced in the right common  femoral artery.  A 0.035 Wholey guidewire advanced through the needle  into the mid abdominal aorta.  A 5-French sheath then advanced over the  guidewire.  The dilator removed and the sheath flushed with heparin  saline solution.   The patient administered 50 mcg fentanyl and 1 mg of Versed  intravenously.  A crossover catheter then advanced up to the aortic  bifurcation and engaged in the left common iliac artery.  A 0.035 angled  Glidewire was then advanced through the catheter and down into left  external iliac artery.  The catheter was  then advanced over guidewire  into left external iliac artery.  Selective left lower extremity  arteriogram obtained by contrast injection through the catheter in the  left external iliac artery.  This revealed a patent left external iliac  artery.  The left common femoral artery was widely patent.  The profunda  femoris artery was intact without significant stenosis.  The left  femoral popliteal vein bypass graft was patent.  There was tortuosity  noted in the bypass graft.  There was an area of apparent mild stenosis  at the level the distal thigh in the vein bypass graft in the left leg.  The left popliteal artery was patent.  There was intact three-vessel  runoff in the left calf.   Selective views were then obtained of the vein graft to assure there was  no significant stenosis.  There was an area of mild stenosis noted at  the level the distal thigh in the vein graft.  This was not felt to be  significantly flow limiting.  Several angled views were obtained to  verify this.   The distal anastomosis to the popliteal artery was evaluated with  selective  views and angled views, and this again appeared to be widely  patent.  The proximal anastomosis was also evaluated with multiple views  with no evidence of significant proximal stenosis in the vein graft or  in the left common femoral artery.   The catheter was then removed.  A retrograde injection made through the  right femoral sheath, and this revealed patent right external iliac  artery.  Right common femoral artery was patent.  The right profunda  femoris artery revealed mild proximal irregularity.  The right  superficial femoral artery was patent to the mid thigh, but there was a  high-grade stenosis.  There was then an area of dilatation which showed  change following previous atherectomy.  Extensive geniculate collaterals  were present.  The right superficial femoral artery and popliteal artery  were occluded.  There was  reconstitution of the anterior tibial artery  at the interosseous membrane.  The proximal right peroneal artery was  patent but occluded in the proximal third of the calf, and the right  posterior tibial artery was occluded.  Dominant runoff to the right foot  anterior tibial artery.   This completed the arteriogram procedure.  There were no apparent  complications.  The right femoral sheath was removed.  The patient  transferred to the holding area in stable condition.   FINAL IMPRESSION:  1. Patent left femoral-popliteal bypass without significant vein graft      stenosis.  2. Intact three-vessel artery runoff, left calf.  3. Right superficial femoral artery occlusion and right popliteal      artery occlusion.  4. Single vessel right anterior tibial runoff in the right calf.   DISPOSITION:  These results have been reviewed with the patient.  She  will be rescheduled for percutaneous attempt at revascularization of the  right lower extremity with treatment of right popliteal and superficial  femoral artery occlusions.      Balinda Quails, M.D.  Electronically Signed     PGH/MEDQ  D:  06/24/2006  T:  06/24/2006  Job:  [id number]

## 2010-07-03 NOTE — H&P (Signed)
NAME:  Paula [name], Paula [name]          ACCOUNT NO.:  [id number]   MEDICAL RECORD NO.:  [id number]          PATIENT TYPE:  INP   LOCATION:  NA                           FACILITY:  [facility]   PHYSICIAN:  Balinda Quails, M.D.    DATE OF BIRTH:  1945-07-21   DATE OF ADMISSION:  DATE OF DISCHARGE:                                HISTORY & PHYSICAL   PRIMARY CARE PHYSICIAN:  Gwen Pounds, M.D.   CHIEF COMPLAINT:  Claudication symptoms, discoloration of leg.   HISTORY OF PRESENT ILLNESS:  This is a 65 year old Caucasian female status  post left fem-pop below-knee bypass graft with reverse saphenous graft on  March 03, 2005.  The patient had a follow-up duplex evaluation at [facility].  [facility] on May 12, 2005, which revealed a high-grade  stenosis of the vein at its origin.  The patient had ABIs done which showed  a decrease in the left from 0.71 to 0.61.  The patient does complain of left  leg numbness.  She does have increased pain in her calf when walking.  The  pain does not radiate.  The pain stays in the calf.  The patient does  complain of like a charlie horse in her left calf at rest.  The patient  does complain of tingling in her left foot since her surgery in January.  The patient does have good sensory function.  The patient states she has a  decrease in temperature.  She does complain of some peripheral edema  slightly more greater on the left than right.  The patient denies any  ulceration, drainage from her lower extremities.  The patient does complain  of recent leg tiredness.   The patient underwent lower extremity arterial evaluation on May 12, 2005,  which showed right femoral artery has multiple areas of narrowing.  There  appears to be greater than 50% stenosis in the distal femoral/proximal  popliteal artery.  Popliteal artery is patent with diffuse plaque.  Moderate  tibial perineal disease.  Duplex in the left lower extremity demonstrates  probable mild  __________ femoral to femoral artery.  The fundi and popliteal  arteries are patent.  The patient has recently had a femoral to tibial  artery bypass graft.  There is a high-grade stenosis of vein, unable to  visualize the distal graft anastomoses.  Significant decrease in ABIs since  the beginning of March 25, 2005.  An arteriogram on Jun 25, 2005, was  performed, the results are unavailable at this time, however, they will be  reviewed prior to the patient's admission.  It was best felt that the  patient undergo a left fem-pop continue.   PAST MEDICAL HISTORY:  1.  Hypertension.  2.  Peripheral vascular occlusive disease and claudication.  3.  Hyperlipidemia.  4.  Constipation.  5.  History of cellulitis and infection in January 2007.  6.  GERD.   PAST SURGICAL HISTORY:  1.  T&A in 1955.  2.  Left carpal tunnel repair in 1988.  3.  Right carpal tunnel repair in 1989.  4.  Left 1999.  5.  Right posterior tibial tendon repair.   ALLERGIES:  CODEINE.  The patient has reaction severe nausea, vomiting.  The  patient can take hydrocodone but not OXYCODONE.   MEDICATIONS:  1.  Aspirin 325 mg p.o. daily  2.  Plavix 75 mg p.o. daily  3.  Metoprolol 25 mg b.i.d.  4.  Vytorin 10/20 mg daily.  5.  Benicar 20/12.5 mg daily.  6.  Xanax 0.25 mg nightly.  7.  Omega-3 fish oil 1-2 g daily.  8.  Hydrocodone.  9.  Stool softener daily.   HISTORY OF PRESENT ILLNESS:  See HPI for pertinent positives and negatives.  Otherwise, negative for CVA, diabetes mellitus, hypothyroidism, coronary  artery disease.  The patient does have positive nocturia and hemorrhoids.   SOCIAL HISTORY:  The patient is married with one child.  The patient lives  with her family.  The patient denies alcohol and tobacco abuse.  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  The patient  does drive and resides in [place], [place].   FAMILY HISTORY:  Mother deceased.  She had  a history of mild aortic  stenosis.  The father is deceased at 61 due to a ruptured double aortic  aneurysm.  The patient's maternal grandmother had congestive heart failure.   PHYSICAL EXAMINATION:  VITAL SIGNS:  Respirations 16.  GENERAL:  This is a 65 year old Caucasian female in no acute distress.  HEAD, EYES, EARS, NOSE AND THROAT:  Normocephalic, atraumatic.  Pupils  equal, round, react to light and accommodation.  Extraocular movements  intact.  Oral mucosa pink and moist.  Sclerae nonicteric.  NECK:  Neck supple.  No carotid bruit appreciated.  RESPIRATORY:  Symmetrical on inspiration.  Unlabored, clear to auscultation  bilaterally.  CARDIAC:  Regular rate and rhythm.  The patient has a 2/6 systolic murmur.  ABDOMEN:  Soft, nontender, nondistended.  Normoactive bowel sounds x4.  GU/RECTAL:  Deferred.  The patient's last menstrual period was ___________  greater than right.  The patient's lower extremities are cold.  She had 2+  radial pulses bilaterally.  The patient's femoral pulses 2+ on the right and  1+ on the left.  No popliteal or dorsalis pedis pulses felt.  The patient  does have a 1+ posterior tibial pulse on the right and no posterior tibial  pulses palpable on the left.  The patient's left foot is well perfused.  The  patient had a slow medially area of sinus tract in the mid calf incision of  her left leg of the harvest site which is now closed.  Her deep tendon  reflexes 2+ and symmetrical.   ASSESSMENT:  1.  Left __________.  Will admit the patient to [facility]      on Jul 07, 2005, under Dr. Madilyn Fireman' service.  2.  The patient's Plavix will be held on Jun 28, 2005, and will be resumed      after her surgery.  3.  Patient will undergo left fem-pop bypass vein graft stenosis repair on      Jul 07, 2005.  4.  The patient will undergo a atherectomy on Jul 09, 2005. 5.  The patient was seen and evaluated prior to admission by Dr. Madilyn Fireman, the       risks and benefits were explained in great detail to the patient and she      agrees to continue the above.      Constance Holster, PA  Balinda Quails, M.D.  Electronically Signed    JMW/MEDQ  D:  07/05/2005  T:  07/05/2005  Job:  [id number]   cc:   Gwen Pounds, MD  Fax: [phone]

## 2010-07-03 NOTE — Cardiovascular Report (Signed)
Paula [name]                      ACCOUNT NO.:  [id number]   MEDICAL RECORD NO.:  [id number]                   PATIENT TYPE:  OIB   LOCATION:  2872                                 FACILITY:  [facility]   PHYSICIAN:  Balinda Quails, M.D.                 DATE OF BIRTH:  February 10, 1946   DATE OF PROCEDURE:  10/26/2002  DATE OF DISCHARGE:                              CARDIAC CATHETERIZATION   PHYSICIAN:  Balinda Quails, M.D.   DIAGNOSES:  Bilateral lower extremity claudication.   PROCEDURE:  1. Abdominal aortogram with bilateral lower extremity runoff arteriography.  2. Selective left lower extremity arteriogram.  3. Left superficial femoral artery percutaneous transluminal angioplasty.  4. Left superficial femoral artery stenting.   CLINICAL NOTE:  Paula [name] is a 65 year old moderately obese female  with a history of bilateral lower extremity claudication.  This most  severely affects her left calf.  Doppler evaluation revealed evidence of  bilateral superficial femoral artery occlusive disease and tibial vessel  disease.  She is brought to the catheterization laboratory at this time for  diagnostic arteriography.  The patient is also consented for possible  percutaneous intervention.   PROCEDURE NOTE:  The patient brought to the catheterization lab in stable  condition.  Placed in the supine position.  Both groins prepped and draped  in the sterile fashion.  2 mg of Versed, 2 mg of Nubain administered  intravenously.  Skin and subcutaneous tissue of the right groin instilled  with 1% Xylocaine.  A needle was easily introduced into the right common  femoral artery.  A 0.035 Wholey guidewire advanced through the needle into  the mid abdominal aorta under fluoroscopy.  The needle removed and a 5-  French sheath advanced over the guidewire.  The dilator was removed.  The  sheath flushed with heparin and saline solution.   A pigtail catheter was then advanced over the  guidewire.  The pigtail  catheter positioned in the juxtarenal aorta.  Standard AP mid abdominal  aortogram obtained.  Single widely patent bilateral renal arteries were  present.  The infrarenal aorta appeared normal in caliber without  significant plaque formation.  The common iliac arteries bilaterally were  widely patent.  The internal and external iliac arteries were also normal.   Lower extremity runoff arteriography then obtained with the catheter  positioned at the aortic bifurcation.   The right lower extremity revealed patent common femoral artery.  The  profunda femoris artery was also normal.  The proximal right superficial  femoral artery was intact without significant stenosis.  The mid right  superficial femoral artery revealed mild irregularity with minimal stenosis.  At the adductor canal there was a moderate stenosis of the right superficial  femoral artery/popliteal artery junction with approximately a 50% stenosis.  The right popliteal artery revealed moderate irregularity and tubular  stenosis of approximately 30%.  Irregularity of the distal right popliteal  and  tibioperoneal trunk was noted.  Right lower extremity runoff revealed  intact posterior tibial and anterior tibial arteries.  The perineal artery  was occluded just beyond its origin.   The left leg revealed intact common femoral artery.  The profunda femoris  artery was also normal.  Mild irregularity of the proximal left superficial  femoral artery was present.  There was a short segment occlusion of the left  superficial femoral artery at the adductor canal with irregular stenosis  proximal to this.  There was reconstitution of the popliteal artery just  beyond the adductor canal and irregularity of the left popliteal artery with  moderate 30-50% stenosis.  Runoff from the left lower extremity was via the  anterior and posterior tibial arteries with subtotal occlusion of the  perineal artery.   The  guidewire was then reinserted and IMA catheter advanced over the [facility]  guidewire engaged in the left common iliac artery.  The Wholey guidewire  removed and exchanged for a Glidewire which was advanced in the left common  femoral artery.  The right femoral sheath then removed and a Terumo sheath  advanced over the guidewire to the left external iliac artery.  The sheath  removed from the Terumo and the Terumo flushed.  The Glidewire removed and  the 0.035 Wholey guidewire was advanced into the left superficial femoral  artery.  This was advanced down to the left superficial femoral artery  occlusion.  The Wholey guidewire was advanced across the occlusion into the  left popliteal artery.  The patient administered 5000 units of heparin  intravenously.   A Powerflex 4 x 4 balloon was then advanced over the guidewire.  Hand  injection of contrast made through the sheath to position the balloon  appropriately at the left superficial femoral artery occlusion.  The  Powerflex balloon was inflated at 12 atmospheres x30 seconds.  At completion  of this the balloon was removed and a post dilatation left lower extremity  arteriogram obtained through the sheath.  This revealed a suboptimal  technical result with evidence of dissection and marked irregularity of the  left superficial femoral artery at the angioplasty site.  At this time due  to the poor technical result, it was decided the patient would be  appropriately treated with stenting.  A 6 x 40 SMART self expanding stent  was therefore advanced over the guidewire, positioned at the area of  angioplasty in the left superficial femoral artery.  This was deployed  across the angioplasty site.  The deployment catheter was then removed and a  completion arteriogram obtained.  This revealed an improved technical result  with excellent flow through the left superficial femoral artery angioplasty  site.  A completion arteriogram of left lower  extremity runoff was carried out and  this revealed the posterior tibial and anterior tibial arteries to be intact  without evidence of embolization or thrombosis.   This completed the arteriogram and interventional procedure.  The guidewire  was removed and the Terumo sheath drawn back into the right common iliac  artery.  ACT will be checked and the sheath removed appropriately.  There  were no apparent complications.  Total contrast load 185 mL Visipaque.   FINAL IMPRESSION:  1. Single widely patent bilateral renal arteries.  2. Normal aortoiliac segment.  3. Bilateral superficial femoral artery occlusive disease with 50% stenosis     of the right superficial femoral artery and a short segment occlusion of     the left  superficial femoral artery.  4. Successful angioplasty and stenting of left superficial femoral artery     occlusion with SMART stent.    DISPOSITION:  These results have been discussed with the patient and her  husband.  She will be discharged from hospital today.  She was administered  150 mg of Plavix following the procedure and will remain on Plavix 75 mg  daily for six weeks and aspirin 81 mg daily for life.  The patient  instructed regarding lifestyle changes including controlling cholesterol and  weight control.                                               Balinda Quails, M.D.    PGH/MEDQ  D:  10/26/2002  T:  10/26/2002  Job:  [id number]   cc:   Gwen Pounds, M.D.  [address]  [place]  [place] [address]  Fax: [phone]

## 2010-07-03 NOTE — Discharge Summary (Signed)
[name], Paula [name]          ACCOUNT NO.:  [id number]   MEDICAL RECORD NO.:  [id number]          PATIENT TYPE:  INP   LOCATION:  6735                         FACILITY:  [facility]   PHYSICIAN:  Balinda Quails, M.D.    DATE OF BIRTH:  1945-03-21   DATE OF ADMISSION:  11/19/2005  DATE OF DISCHARGE:  11/27/2005                                 DISCHARGE SUMMARY   HISTORY OF PRESENT ILLNESS:  The patient is a 65 year old female who  presented with an approximately six day history of pain, swelling, redness,  and fever.  She felt a pop in her left knee and calf approximately six days  prior to admission.  She then began to develop redness in the knee 24 hours  later.  She then had spiking fevers or up to 102 intermittently over the  next few days.  She was placed on Keflex p.o. and after a five day course  was showing no significant improvement.  Left leg remained erythematous and  swollen.  The patient has a history of multiple revascularization procedures  of the left leg by Dr. Madilyn Fireman.  She had a left fem-pop bypass in January of  2007.  She then had a vein patch repair of the fem-pop bypass in May of  2007.  She had been doing well prior to this.  She did not describe  claudication symptoms or symptoms of ischemia.  She denied rest pain.  She  was seen by Dr. Darrick Penna and told she required admission for further  evaluation and treatment and more specifically intravenous antibiotics as  the oral regimen was failing.   PAST MEDICAL HISTORY:  As above, she has a history of hypertension,  hyperlipidemia, constipation, migraine headaches, and reflux.  She had a  previous episode of cellulitis in the same leg in January of 2007.   PAST SURGICAL HISTORY:  Includes tonsillectomy, carpal tunnel repair, and  right posterior tibial tendon repair.  He has above mentioned bypasses as  well on the left leg.   ALLERGIES:  SHE HAS A SIDE EFFECT OF SEVERE NAUSEA AND VOMITING WITH  CODEINE.  SHE CAN  TAKE HYDROCODONE BUT NO OXYCODONE.   MEDICATIONS:  Prior to admission, aspirin 325 mg daily, Plavix 75 mg daily,  metoprolol 25 mg twice daily, Vytorin 10/20 once daily, Benicar 20 mg/12.5  mg once daily, and she was on Keflex 500 mg t.i.d.   For family history, social history, review of systems, and physical exam  please see the history and physical done at the time of admission.   HOSPITAL COURSE:  The patient was admitted and placed on intravenous  bacillin.  A venous duplex was obtained which was negative.  She had an  orthopedic consultation and they feel as though she has, on MRI, evidence of  chondromalacia.  Clinically, she has a pes anserine bursitis pain, as well  as a meniscus tear.  The patient was not felt to be a candidate for intra-  articular joint injection with steroids due to the cellulitis.  Her knee  pain has been resolving.  The cellulitis has showed substantial improvement.  Tentatively, she is felt to be stable for discharge on 11/27/2005 pending  morning round re-evaluation.   MEDICATIONS ON DISCHARGE:  Will be as preoperatively.  Her Keflex will be  discontinued and she will be placed on a 10-day course of Augmentin 500 mg  b.i.d.  For pain she can take Tylenol p.r.n.   INSTRUCTIONS:  The patient received written instructions in regard to  medications, activity, diet, wound care, and followup.   FOLLOWUP:  She is included to see Dr. Madilyn Fireman on 12/02/2005 at 10 a.m.  She is  instructed to follow up with Dr. Wyline Mood in 1-2 weeks.  Call to arrange this  appointment.  Additionally, she is instructed to follow up with her family  doctor as she has had some elevated blood glucose levels during her  hospitalization and her hemoglobin A1c was 6.5.   CONDITION ON DISCHARGE:  Stable and improving.   FINAL DIAGNOSES:  1. Left knee cellulitis.  2. Tear of the medial meniscus with medial compartment degenerative      chondrosis of the left knee, mild chondromalacia  patella over the      medial facet, and generalized soft tissue edema correlating with the      cellulitis.  These are all by MRI.  3. Possible prediabetic condition with elevated capillary blood glucoses      in the range of 106-218 during the hospitalization and a hemoglobin A1c      of 6.5.   OTHER DIAGNOSES:  1. Hypertension.  2. Hyperlipidemia.  3. History of constipation.  4. History of migraine headaches.  5. History of gastroesophageal reflux.  6. History of previous surgeries.      Rowe Clack, P.A.-C.      Balinda Quails, M.D.  Electronically Signed    WEG/MEDQ  D:  11/26/2005  T:  11/27/2005  Job:  [id number]   cc:   Balinda Quails, M.D.  Marisue Humble

## 2010-07-03 NOTE — Discharge Summary (Signed)
Paula [name], Paula [name]          ACCOUNT NO.:  [id number]   MEDICAL RECORD NO.:  [id number]          PATIENT TYPE:  INP   LOCATION:  2035                         FACILITY:  [facility]   PHYSICIAN:  Balinda Quails, M.D.    DATE OF BIRTH:  11-01-1945   DATE OF ADMISSION:  07/07/2005  DATE OF DISCHARGE:                                 DISCHARGE SUMMARY   ADMISSION. DIAGNOSIS:  Recurrent claudication symptoms to left lower  extremity.   PAST MEDICAL HISTORY AND DISCHARGE DIAGNOSES:  1.  Hypertension.  2.  Hyperlipidemia.  3.  Constipation.  4.  History of cellulitis.  5.  __________.  6.  Gastroesophageal reflux disease.  7.  Tonsillectomy and adenoidectomy in 1955.  8.  Left carpal tunnel repair in 1988.  9.  Right carpal tunnel repair in 1989.  10. Right posterior tibial tendon repair.  11. Peripheral vascular occlusive disease status post left below-knee      amputation femoral-popliteal bypass graft with reverse saphenous vein      January 2007.  12. Status post repair of vein graft stenosis of left femoral-popliteal      bypass with patch angioplasty.  13. Status post arthrectomy right superficial femoral artery.   ALLERGIES:  CODEINE causes severe nausea and vomiting.   BRIEF HISTORY:  The patient is a 65 year old Caucasian female status post  left femoral-to-below-the-knee-popliteal bypass graft with reverse greater  saphenous vein January 2007.  In followup, a duplex evaluation revealed a  high-grade stenosis in vein at its origin.  ABIs also revealed a decrease in  the left from 0.71 to 0.61.  The patient complained of left leg numbness as  well as increased pain in her calf with walking.  She also noted decreased  temperature in the left lower extremity.  After evaluation of the patient's  duplex, it was Dr. Madilyn Fireman' opinion the patient should proceed with an  arteriogram.  This verified a focal high-grade stenosis at the anastomosis  of the graft.  It was his opinion that  she should proceed with surgical  repair of the vein graft stenosis.   HOSPITAL COURSE:  The patient was admitted and taken to the OR on Jul 07, 2005, for repair of vein graft stenosis of the left femoral-to-popliteal  with patch angioplasty utilizing an endarertized segment of the superficial  femoral artery.  The patient tolerated the procedure well and was  hemodynamically stable immediately postoperatively.  The patient was taken  from the OR to the postanesthesia care unit in stable condition.  The  patient was extubated without complications and woke up from anesthesia  neurologically intact.   On postoperative day #1, the patient was without complaint.  She was  afebrile with stable vital signs.  She was doing well status post surgery  and was prepared for arthrectomy in the peripheral vascular lab the  following day.   The patient was taken to the peripheral vascular lab by Dr. Madilyn Fireman on Jul 09, 2005, for atherectomy of the right superficial femoral artery.  She  tolerated this procedure well and was stable postprocedure.  She had been  continued on Plavix and is ambulating.  Provided the patient continues to  progress in the current manner, she will be ready for discharge within the  next 1 to 2 days pending morning round evaluation.   LABORATORY DATA:  CBC on Jul 08, 2005: Hemoglobin 9.8, hematocrit 29.9,  white count 3.8, platelets 107.  BMP on Jul 08, 2005: Sodium 139, potassium  3.9, BUN 9, creatinine 0.8, glucose 168.   CONDITION ON DISCHARGE:  Improved.   INSTRUCTIONS:  1.  Diet: Low-salt, low-fat.  2.  Activity: The patient should refrain from driving for 1 week and      continue daily breathing and walking exercises.  3.  Wound care: The patient may shower daily and clean incisions with soap      and water.  If wound problems arise, the CVTS office should be      contacted.   DISCHARGE MEDICATIONS:  1.  Vicodin 500 per 5 one to two q. 4-6 h p.r.n. pain.  2.   Aspirin 325 mg daily.  3.  Plavix 75 mg daily.  4.  Metoprolol 25 mg twice daily.  5.  Vitorin 10 per 20 mg daily.  6.  Benicar 20 per 12.5 mg daily.  7.  Xanax 0.25 mg nightly.  8.  Omega 3 fish oil 1 to 2 g daily.  9.  Stool softener daily.   FOLLOW UP:  Appointment with Dr. Madilyn Fireman on August 05, 2005, at 3 p.m.      Pecola Leisure, PA      P. Liliane Bade, M.D.  Electronically Signed    AY/MEDQ  D:  07/09/2005  T:  07/10/2005  Job:  [id number]

## 2010-07-03 NOTE — H&P (Signed)
NAMEBOBETTA, KORF          ACCOUNT NO.:  [id number]   MEDICAL RECORD NO.:  [id number]          PATIENT TYPE:  INP   LOCATION:  NA                           FACILITY:  [facility]   PHYSICIAN:  Balinda Quails, M.D.    DATE OF BIRTH:  January 03, 1946   DATE OF ADMISSION:  03/03/2005  DATE OF DISCHARGE:                                HISTORY & PHYSICAL   CARDIOLOGIST:  Rosine Abe, M.D.   CHIEF COMPLAINT:  Peripheral vascular occlusive disease.   HISTORY OF PRESENT ILLNESS:  This is a 65 year old Caucasian female with  peripheral vascular disease who is status post left superficial femoral  artery stent in 2005.  The patient has developed recurrent claudication  symptoms in the left lower extremity and has developed rest pain on the leg.  Her left ABI is 0.3.  She underwent arteriography on February 19, 2005, which  revealed multiple areas of significant stenosis of right superficial femoral  artery with extensive disease in left superficial femoral artery and  occlusion of the left superficial femoral artery at the adductor canal with  3-vessel tibial runoff on the left.  In preparation for surgery, the patient  underwent stress Cardiolite on February 25, 2005, and this revealed no  evidence of ischemia.  The patient complained of left calf and foot pain as  well as claudication symptoms.  In addition, she complained of rest pain,  peripheral edema, erythema of the left foot, and numbness and tingling.  She  also noted decreased temperature in the left foot as compared with the  right.  She denies any ulceration, decrease in motor function, chest pain,  shortness of breath, TIA or CVA symptoms.  The patient presents to the CVTS  office today for History and Physical exam prior to surgery.   PAST MEDICAL HISTORY:  1.  Hyperlipidemia.  2.  Hypertriglyceridemia.  3.  Gastroesophageal reflux disease.  4.  Peripheral vascular occlusive disease.   PAST SURGICAL HISTORY:  1.  Left  superficial femoral artery PTA and stent in 2005.  2.  Bilateral carpal tunnel release in 1989 and then 1991.  3.  Tubal ligation in 1999.  4.  Right posterior tibial tendon repair.  5.  Tonsillectomy in 1955.   ALLERGIES:  CODEINE causes nausea.   MEDICATIONS:  1.  Plavix 75 mg daily, stopped on February 19, 2005.  2.  Aspirin 325 mg daily.  3.  Vicodin 500 mg q.4-6h. p.r.n. pain.   REVIEW OF SYSTEMS:  Please see HPI for significant positives and negatives.  Otherwise negative for cardiac disease, kidney disease, and diabetes  mellitus.   SOCIAL HISTORY:  This is a married female with one child who lives with  family. She has never smoked and only on occasion drinks alcohol.  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  She will have assistance available to her after surgery.   FAMILY HISTORY:  Mother: Hypertension, atrial fibrillation, carotid disease,  varicose veins.  Father: Cardiac disease, peripheral vascular disease, and  died from a ruptured abdominal aortic  aneurysm.   PHYSICAL EXAMINATION:  VITAL SIGNS: Blood pressure 158/98 left arm sitting,  heart rate 76, respirations 18.  GENERAL:  This is a 65 year old Caucasian female in no acute distress.  HEENT:  Normocephalic and atraumatic.  Pupils equal, round, and reactive to  light and accommodation.  Extraocular movements are intact.  Oral mucosa  pink and moist.  Sclerae nonicteric.  NECK:  Supple with no JVD, no bruits, no lymphadenopathy.  Carotids are  palpable.  LUNGS: Respirations are symmetric, unlabored, and clear.  CARDIAC: Regular rate and rhythm with 2/6 systolic murmur.  No rubs and no  gallops.  ABDOMEN:  Soft, nontender, nondistended, with normoactive bowel sounds and  obese.  GU/RECTAL: Deferred.  EXTREMITIES:  There is nonpitting edema present bilateral lower extremities  as well as multiple small varicosities.  There is no evidence of skin   breakdown, cyanosis, clubbing, or mottling.  Temperature in bilateral lower  extremities is warm.  PULSES: Radial 2+ bilaterally.  Femoral 2+ on the right, 1+ on the left.  Popliteal and pedal pulses are not palpable bilaterally.  NEUROLOGIC: Exam is nonfocal. The patient is alert and oriented x3. The gait  is steady.  Muscle strength 5/5 throughout extremities and symmetric.  Deep  tendon reflexes are 2+.   ASSESSMENT:  Left femoral-to-popliteal occlusive disease.   PLAN:  Left femoral-to-popliteal bypass graft on March 03, 2005, by Dr.  Madilyn Fireman.  Dr. Madilyn Fireman has seen and evaluated the patient prior to this admission  and has explained the risks and benefits of the procedure, and the patient  has agreed to continue.      Pecola Leisure, PA      P. Liliane Bade, M.D.  Electronically Signed    AY/MEDQ  D:  03/01/2005  T:  03/01/2005  Job:  [id number]   cc:   Elmore Guise., M.D.  Fax: [phone]

## 2010-07-03 NOTE — Op Note (Signed)
[name], [name]                      ACCOUNT NO.:  [id number]   MEDICAL RECORD NO.:  [id number]                   PATIENT TYPE:  AMB   LOCATION:  DSC                                  FACILITY:  [facility]   PHYSICIAN:  Nadara Mustard, M.D.                DATE OF BIRTH:  1945/07/07   DATE OF PROCEDURE:  07/25/2003  DATE OF DISCHARGE:                                 OPERATIVE REPORT   PREOPERATIVE DIAGNOSIS:  Posterior tibial tendon insufficiency, right foot  with a flexible pronated valgus foot.   POSTOPERATIVE DIAGNOSIS:  Posterior tibial tendon insufficiency, right foot  with a flexible pronated valgus foot.   OPERATION PERFORMED:  Reconstruction of the posterior tibial tendon with a  flexor digitorum tendon.   SURGEON:  Nadara Mustard, M.D.   ANESTHESIA:  General.   ESTIMATED BLOOD LOSS:  Minimal.   ANTIBIOTICS:  1g Kefzol.   TOURNIQUET TIME:  36 minutes with the Esmarch at the ankle.   DISPOSITION:  To post anesthesia care unit in stable condition.  Plan for  discharge to home.   INDICATIONS FOR PROCEDURE:  The patient is a 65 year old woman with a  painful flexible flat foot.  The patient has undergone conservative care  with immobilization with a walker for six weeks as well as orthotics.  The  patient has had persistent pain, has a flexible pronated valgus foot.  She  has pain on single heel raise and she does have a reconstructable arch.  After failure of conservative care, the patient presents at this time for  surgical intervention.  The risks and benefits were discussed including  infection, neurovascular injury, persistent pain, persistent deformity, need  for subtalar fusion.  The patient states that she understands and wishes to  proceed at this time.   DESCRIPTION OF PROCEDURE:  The patient was brought to operating room 5 and  underwent a general anesthetic.  After adequate level of anesthesia  obtained, the patient's right lower extremity was prepped  using DuraPrep and  draped into a sterile field.  Paula [name] was used to cover all exposed skin.  A  curvilinear ________ incision was made over the tarsal tunnel and this was  carried down to the insertion of the posterior tibial tendon.  The flexor  sheath of the posterior tibial tendon was incised and the posterior tibial  tendon was evaluated and debrided.  The flexor digitorum was also removed  from within its sheath after excision with a 15 blade knife and this was  used to reinforce the posterior tibial tendon. A drill hole was made from  dorsal to plantar through the navicular.  Using 2-0 FiberWire and a suture  passer, the flexor digitorum was passed from plantar to dorsal with the #2-0  FiberWire with the suture passer.  This was then oversewn to itself to the  posterior tibial tendon.  The two tendons were also reinforced  longitudinally with some  2-0 FiberWire.  The patient had good reconstitution  of her arch with her foot in a resting position.  The retinaculum over the  tendons was also closed using 2-0 FiberWire.  The wound was irrigated.  The  subcutaneous was closed using 2-0 Vicryl.  Skin was closed using a far-near-  near-far stitch as well as vertical mattress and simple sutures with 2-0  nylon.  The wound was covered with Adaptic orthopedic sponges and a  compressive [name] dressing was applied.  The patient was extubated  and taken to post anesthesia care unit in stable condition.  Plan for  nonweightbearing, crutches, prescription for Vicodin, plan to follow up in  the office in two weeks.                                               Nadara Mustard, M.D.    MVD/MEDQ  D:  07/25/2003  T:  07/26/2003  Job:  [id number]

## 2010-07-03 NOTE — Op Note (Signed)
Paula [name], Paula [name]          ACCOUNT NO.:  [id number]   MEDICAL RECORD NO.:  [id number]          PATIENT TYPE:  AMB   LOCATION:  SDS                          FACILITY:  [facility]   PHYSICIAN:  Balinda Quails, M.D.    DATE OF BIRTH:  07-05-1945   DATE OF PROCEDURE:  12/31/2005  DATE OF DISCHARGE:                               OPERATIVE REPORT   DIAGNOSIS:  Ischemic right foot.   PROCEDURES:  1. Right lower extremity arteriogram.  2. Right superficial femoral artery percutaneous transluminal      angioplasty.   ACCESS:  Left common femoral artery, 7-French sheath.   CONTRAST:  100 mL Visipaque.   COMPLICATIONS:  None apparent.   CLINICAL NOTE:  Paula [name] is a 65 year old female with fairly  advanced peripheral vascular disease.  She has a left femoral tibial  bypass.  She recently underwent revision of this with angioplasty.   She has had a previous right superficial femoral artery and popliteal  atherectomy.  She has had significant drop the right ankle-brachial  index and evidence of a right superficial femoral artery stenosis in the  mid-thigh.  Brought to the catheterization lab at this time for  diagnostic arteriography and possible further intervention.   PROCEDURE NOTE:  The patient was brought to the catheterization lab in  stable condition.  Placed in the supine position.  The groins were  prepped and draped in a sterile fashion.  Administered 50 mcg of  fentanyl intravenously.  Skin and subcutaneous tissue of the left groin  were instilled with 1% Xylocaine.  A needle was easily introduced into  the left common femoral artery.  A 0.035 Rosen guidewire was advanced  through the needle into the abdominal aorta.  The needle was removed.  The site was opened with an 11 blade.  A short 5-French sheath was  advanced over the guidewire.  The dilator was removed and the sheath  flushed with heparin saline solution.   A crossover catheter was then advanced over  the guidewire.  The  guidewire was removed.  The crossover catheter was engaged into the  right common iliac artery.  A 0.035 angled glide wire was then advanced  down into the right common iliac, external iliac, and common femoral  artery.  The catheter was then advanced over guidewire down into the  right external iliac artery.  A selective right lower extremity  arteriogram was obtained with hand injection of contrast.  This revealed  patency of the proximal third of the right superficial femoral artery.  The right profunda femoris artery was intact.  Right common femoral  artery also widely patent.  There was, however, a high-grade stenosis in  the mid-right superficial femoral artery.  There were two stenotic areas  quite close to one other.  Each appeared to be at least 80% stenotic.  There was then flow to the above-knee popliteal artery.  The popliteal  artery then occluded at the level of the patella and the knee joint.  There was reconstitution of the anterior tibial artery in the proximal  calf, with runoff to the right foot.  Occlusion  of the right peroneal  artery just beyond its origin and occlusion of the right posterior  tibial artery.   The Rosen guidewire was then advanced through the catheter.  The  catheter was removed.  The left femoral sheath was removed.  A 7-French  Terumo sheath was then advanced over the guidewire and into the right  external iliac artery.  The guidewire was removed.  This sheath was  flushed with heparin saline solution.  The patient was administered 7000  units of heparin intravenously, received an additional 1000 units of  heparin intravenously.  ACT measured 249 seconds.   A 0.014 Whisper guidewire was then advanced through the sheath and into  the right superficial femoral artery.  This was advanced beyond the mid-  right SFA stenosis and down into the proximal right popliteal artery.  The guidewire was then gently advanced into the right  popliteal artery  and actually could be advanced across the right popliteal artery vein  and into the origin of the right anterior tibial artery.   Previous arteriograms were reviewed again, and the right popliteal  artery was noted to be severely diseased, and  angioplasty or  atherectomy was felt to be very high risk.   At this time, a decision was made to perform an angioplasty of the mid-  right SFA lesion to improve inflow into the collateral vessels in the  right mid-leg.   A 4 x 80 Amphirion Deep balloon was therefore advanced over guidewire  and into the right superficial femoral artery to the area of stenosis.  This was inflated at 10 atmospheres x40 seconds.   The balloon was then removed.  A completion arteriogram was carried out.  This revealed excellent technical results.  Improved inflow into the  right foot was evident through brisk collaterals filling the right  anterior tibial artery.   The Terumo sheath was then drawn back into the right common iliac  artery.  The guidewire and balloon were removed.  The Rosen guidewire  was advanced through the sheath, the Terumo sheath was removed, and a  short 7-French sheath was advanced over guidewire.  The dilator was  removed, and the sheath was flushed with heparin saline solution.   There were no apparent complications.  The patient was transferred to  the holding area in stable condition.   FINAL IMPRESSION:  1. High-grade mid-right superficial femoral artery occlusion.  2. Below-knee right popliteal artery occlusion, with single-vessel      reconstitution of the anterior tibial artery.  3. Successful balloon angioplasty of the right superficial femoral      artery lesion without apparent complication.   DISPOSITION:  These results will be reviewed with the patient. The  patient will be discharged today.  Follow up in the office within a  couple of weeks.      Balinda Quails, M.D.  Electronically Signed      PGH/MEDQ  D:  12/31/2005  T:  12/31/2005  Job:  [id number]

## 2010-07-03 NOTE — Op Note (Signed)
NAMESHAUNIECE, KWAN          ACCOUNT NO.:  [id number]   MEDICAL RECORD NO.:  [id number]          PATIENT TYPE:  AMB   LOCATION:  SDS                          FACILITY:  [facility]   PHYSICIAN:  Balinda Quails, M.D.    DATE OF BIRTH:  06/21/1945   DATE OF PROCEDURE:  02/19/2005  DATE OF DISCHARGE:                                 OPERATIVE REPORT   PHYSICIAN:  Denman George, M.D.   DIAGNOSIS:  Ischemic rest pain, left foot.   PROCEDURES:  1.  Abdominal aortogram with bilateral lower extremity runoff arteriography.  2.  Selective right lower extremity arteriogram.  3.  Retrograde left femoral arteriogram.  4.  Start Close, left common femoral artery.   SUMMARY OF HISTORY:  Paula [name] is a 65 year old female with a  history of peripheral vascular disease.  She presented for placement of left  superficial femoral artery stent for SFA occlusive disease approximately two  years ago.  She has developed recurrent claudication symptoms in her left  leg.  She now has early ischemic rest pain in her left leg.  Left ankle  brachial index measures 0.3.  She is brought to the catheterization lab at  this time for further workup with diagnostic arteriography and possible  percutaneous intervention.   PROCEDURE NOTE:  The patient brought into the catheterization lab in stable  condition.  Placed in supine position.  Both groins prepped and draped in a  sterile fashion.   Skin and subcutaneous tissue of the left groin instilled with 1% Xylocaine.  A needle was easily introduced into the left common femoral artery.  A 0.035  Wholey guidewire advanced through the needle into the middle abdominal aorta  under fluoroscopy.  The needle removed.  A 5-French sheath advanced over the  guidewire.  Dilator removed, sheath flushed with heparin and saline  solution.   A standard pigtail catheter was then advanced over the guidewire to the  midabdominal aorta.  Standard AP midabdominal  aortogram obtained.  This  revealed single, widely patent bilateral renal arteries.  The infrarenal  aorta was widely patent.  The common iliac arteries bilaterally revealed no  significant plaque disease.  The external iliac and hypogastric arteries  were patent bilaterally.  Bilateral oblique pelvic arteriography was  obtained, and this verified no evidence of significant aortoiliac occlusive  disease.   Lower extremity runoff arteriography was obtained with a bolus chase  technique.  The right leg revealed a patent common femoral artery.  The  profunda femoris artery was intact.  The right superficial femoral artery  was diffusely diseased with multiple areas of 50-70% stenosis.  At the  adductor canal there was a high-grade stenosis at the junction of the right  superficial femoral-popliteal artery estimated to be 80-90%.  The right  popliteal artery revealed irregular plaque throughout its course.  The right  anterior tibial artery was patent throughout the right calf and continues  into the foot.  There was, however, a significant stenosis just beyond the  origin of right anterior tibial artery.  The tibioperoneal trunk was small  and atretic.  The peroneal and  posterior tibial arteries were occluded in  the right calf.   The left leg revealed an intact common femoral artery.  The left superficial  femoral artery revealed multiple areas of stenosis in the proximal and  midportion.  The area of stenting of the left superficial femoral artery at  the adductor canal was occluded.  The proximal left popliteal artery was  occluded.  There was reconstitution the popliteal artery at the level of the  knee joint.  Intact anterior tibial and tibioperoneal trunk.  Intact three-  vessel runoff in the left calf.   To obtain better views of the right below-knee disease, a guidewire was then  reinserted and a catheter exchange made for an IMA catheter, which was  engaged into the right common  iliac artery.  The Wholey guidewire was then  advanced down to the right external iliac artery.  A catheter exchange then  made through an end-hole catheter placed in the right external iliac artery.  Peek hold views of the right calf vessels were then obtained.  The popliteal  artery was diseased at the knee joint.  The anterior tibial artery revealed  proximal stenosis but was intact throughout the right calf.  The right  tibioperoneal trunk and posterior tibial and peroneal arteries were  occluded.   The end-hole catheter was then removed.  A retrograde left femoral  arteriogram obtained with LAO projection.  This revealed a patent left  profunda femoris artery.  The site of catheterization was the left common  femoral artery, which appeared free of significant disease.  A guidewire was  then inserted and the sheath removed.  A Star Close sheath was then advanced  over the guidewire.  The guidewire removed.  The Star Close device engaged  into the Star Close sheath.  The device then triggered and withdrawn to the  arterial wall and deployed without any apparent complication.   The patient tolerated the procedure well.  No apparent complications.   FINAL IMPRESSION:  1.  Single bilateral widely patent renal arteries.  2.  Intact infrarenal aorta and iliac arteries bilaterally without      significant stenosis.  3.  Multiple areas of significant stenosis of the right superficial femoral      artery.  4.  Right tibial artery disease with single-vessel anterior tibial runoff.  5.  Extensive disease of the left superficial femoral artery with occlusion      of the left superficial femoral artery at the adductor canal.  6.  Reconstitution of the left popliteal artery with intact three-vessel      tibial runoff.   DISPOSITION:  These results been reviewed with the patient's family.  The  patient will be scheduled for elective left femoral-popliteal bypass for relief of ischemic rest  pain.  She will then be considered for possible  atherectomy treatment of the right superficial femoral artery and popliteal  segment.      Balinda Quails, M.D.  Electronically Signed     PGH/MEDQ  D:  02/19/2005  T:  02/20/2005  Job:  [id number]   cc:   Peripheral Vascular Catheterization Lab

## 2010-07-03 NOTE — Discharge Summary (Signed)
Paula [name], Paula [name]          ACCOUNT NO.:  [id number]   MEDICAL RECORD NO.:  [id number]          PATIENT TYPE:  INP   LOCATION:  6731                         FACILITY:  [facility]   PHYSICIAN:  Balinda Quails, M.D.    DATE OF BIRTH:  Jul 26, 1945   DATE OF ADMISSION:  04/02/2005  DATE OF DISCHARGE:  04/07/2005                                 DISCHARGE SUMMARY   TENTATIVE DISCHARGE DATE:  04/07/2005   ADMISSION DIAGNOSIS:  Cellulitis left leg, no response to p.o. antibiotics   DISCHARGE DIAGNOSES:  1.  Cellulitis left lower extremity.  2.  Status post left femoral popliteal bypass graft on March 03, 2005.  3.  Hyperlipidemia.  4.  Hypertension.  5.  Gastroesophageal reflux disease.   CONSULTS:  None.   PROCEDURES:  None.   HISTORY AND PHYSICAL EXAM:  This 65 year old female is status post left  femoropopliteal by Dr Madilyn Fireman on March 03, 2005.  The patient's  postoperative course was complicated by separation of left below-knee  incision and now has had about a 1-week history of cellulitis and  thrombophlebitis in the left calf area.  The patient was treated on oral  Cipro, however, her left calf continued to worsen.  The patient's foot did  remain actively perfused.  The patient was admitted to [facility]  on April 01, 2005. The patient's left groin incisions were healing well.  She did have a below knee incision with a 2-3 cm area separation with cavity  about 2 cm deep and minimal drainage.  Her left foot is warm.  There is an  area of erythema from ankle to mid calf circumferential, and tender to  palpation with edema.   HOSPITAL COURSE:  The patient was admitted and he was started on vancomycin,  1000 mg q.12 h. The patient remained afebrile throughout the hospital course  and her vital signs were stable.  The patient's left lower extremity wound  has been improving each day.  She has been getting wet-to-dry dressing  changes three times daily with normal  saline.  On April 03, 2005 the  patient's cellulitis had improved and it was noted less to palpation.  She  did have drainage and a foul smell.  On April 04, 2005 the patient showed  more granulation tissue.  On April 06, 2005 the patient's left lower  extremity is improving has decreased edema and erythema.  Mild tenderness to  palpation. Her left foot is well perfused.  Thrombophlebitis on her left  calf area has improved.  She does still have a 2-to-3 cm separation cavity  with minimal drainage and no foul odor.  Left foot is well perfused.  The  patient has received IV vancomycin for 5 days and was followed by pharmacy.  The patient was started on p.o. Avelox 400 mg daily since before her  admission and has continued taking Avelox throughout her admission.   The patient's physical exam is within normal limits.  She is afebrile with a  temperature of 99.  Vital signs are stable.  The patient's labs are a white  blood cell count of  7.0.  Hemoglobin 9.7, hematocrit 29.7, and platelets of  188.  The patient had a BMET showing a sodium of 138, potassium 4.1,  chloride 127, CO2 28, BUN 12, creatinine 0.1, and glucose of 122.  The  patient has a regular rate and rhythm heart rate.  His respirations are  clear to auscultation bilaterally.  Extremities:  Please see above.   DISCHARGE CONDITION:  Stable.   DISPOSITION:  The patient will be discharged to home.   DISCHARGE INSTRUCTIONS:  The patient is to continue local wound care with  wet-to-dry dressing changes, daily and p.r.n. The patient is to follow a low  fat, low salt diet.  She is to increase activity slowly. She may shower, but  keep the left leg dry.  No driving or heavy lifting for 1 week.  The patient  will followup with Dr. Madilyn Fireman in 1 week.  The patient will be continued on  Avelox, the amounts will be determined at the patient's time of discharge.  The patient does not wish to have home health.  She is able to trust her  leg  herself.  The patient was instructed to call the office if her wound worsens  such as increasing erythema, drainage, or temperature greater than 101.5.   MEDICATIONS:  1.  Plavix 75 mg p.o. daily.  2.  Aspirin 325 mg p.o. daily.  3.  Lopressor 25 mg p.o. b.i.d.  4.  Vicodin 5/500 taking 1-2 tabs q.4 h. p.r.n.  5.  Avelox, the amount of Avelox taken will be determined at the time of      dictation.      Constance Holster, PA      P. Liliane Bade, M.D.  Electronically Signed    JMW/MEDQ  D:  04/06/2005  T:  04/07/2005  Job:  [id number]

## 2010-07-08 ENCOUNTER — Encounter (INDEPENDENT_AMBULATORY_CARE_PROVIDER_SITE_OTHER): Payer: 59

## 2010-07-08 DIAGNOSIS — I739 Peripheral vascular disease, unspecified: Secondary | ICD-10-CM

## 2010-07-08 DIAGNOSIS — Z48812 Encounter for surgical aftercare following surgery on the circulatory system: Secondary | ICD-10-CM

## 2010-07-15 ENCOUNTER — Ambulatory Visit (HOSPITAL_COMMUNITY)
Admission: RE | Admit: 2010-07-15 | Discharge: 2010-07-15 | Disposition: A | Discharge status: Home / Self Care | Payer: 59 | Source: Ambulatory Visit | Attending: Surgery | Admitting: Surgery

## 2010-07-15 DIAGNOSIS — T82898A Other specified complication of vascular prosthetic devices, implants and grafts, initial encounter: Secondary | ICD-10-CM | POA: Insufficient documentation

## 2010-07-15 DIAGNOSIS — Y832 Surgical operation with anastomosis, bypass or graft as the cause of abnormal reaction of the patient, or of later complication, without mention of misadventure at the time of the procedure: Secondary | ICD-10-CM | POA: Insufficient documentation

## 2010-07-15 DIAGNOSIS — I70219 Atherosclerosis of native arteries of extremities with intermittent claudication, unspecified extremity: Secondary | ICD-10-CM

## 2010-07-15 LAB — POCT I-STAT, CHEM 8
Chloride: 104 mEq/L (ref 96–112)
Glucose, Bld: 194 mg/dL — ABNORMAL HIGH (ref 70–99)
HCT: 34 % — ABNORMAL LOW (ref 36.0–46.0)
Hemoglobin: 11.6 g/dL — ABNORMAL LOW (ref 12.0–15.0)
Potassium: 3.6 mEq/L (ref 3.5–5.1)
Sodium: 139 mEq/L (ref 135–145)

## 2010-07-15 LAB — POCT ACTIVATED CLOTTING TIME
Activated Clotting Time: 211 seconds
Activated Clotting Time: 175 seconds

## 2010-07-15 LAB — GLUCOSE, CAPILLARY
Glucose-Capillary: 196 mg/dL — ABNORMAL HIGH (ref 70–99)
Glucose-Capillary: 161 mg/dL — ABNORMAL HIGH (ref 70–99)

## 2010-07-22 NOTE — Op Note (Signed)
Paula [name], Paula [name]          ACCOUNT NO.:  [id number]  MEDICAL RECORD NO.:  [id number]           PATIENT TYPE:  O  LOCATION:  [facility]SC                         FACILITY:  [facility]  PHYSICIAN:  Juleen China IV, MDDATE OF BIRTH:  1945-10-25  DATE OF PROCEDURE:  07/15/2010 DATE OF DISCHARGE:                              OPERATIVE REPORT   PREOPERATIVE DIAGNOSIS:  Bypass graft stenosis.  POSTOPERATIVE DIAGNOSIS:  Bypass graft stenosis.  PROCEDURE PERFORMED: 1. Ultrasound access, right femoral artery. 2. Abdominal aortogram. 3. Bilateral lower extremity runoff. 4. Third order catheterization. 5. Angioplasty left femoropopliteal bypass graft. 6. Followup x2.  INDICATIONS:  This is a 65 year old female who has previously had bilateral lower extremity bypass procedures by Dr. Madilyn Fireman.  She has been followed by ultrasound surveillance and recently found to have elevated velocities within her left fem-pop bypass graft, with a significant drop in her ABIs.  She comes today for arteriogram and possible intervention.  PROCEDURE:  The patient was identified in the holding and taken to room #8, placed supine on the table.  Both groins were prepped and draped in the usual fashion.  A time-out was called.  The right femoral artery was evaluated with ultrasound and this was found to be widely patent. Digital ultrasound image was acquired.  The right femoral was accessed under ultrasound guidance and 18-gauge with a micropuncture needle and 0.081 wire was advanced out resistance.  A micropuncture sheath was placed.  A Bentson wire was then advanced to the micropuncture sheath which was removed and replaced with a 5-French sheath.  An Omni flush catheter was advanced to the level of L1.  Abdominal aortogram was obtained.  Next, using Omni flush catheter and Bentson wire, the aortic bifurcation was crossed using an end-hole catheter and the external iliac artery on the left, the left leg runoff  was obtained.  After the left leg runoff at the end of the case, right leg evaluation was performed with retrograde sheath injection.  FINDINGS:  Aortogram:  The visualized portions of suprarenal abdominal aorta showed no significant disease.  There were single renal arteries bilaterally.  The infrarenal abdominal aorta was widely patent. Bilateral common iliac, external iliac, and internal iliac arteries were widely patent.  Left lower extremity:  Left common femoral artery is widely patent.  A femoral to below-knee popliteal bypass graft is seen originating in the left groin.  There is a somewhat tortuous course of the bypass graft in the upper thigh.  There is an area of stenosis approximately 60% near the proximal bypass graft consistent with valve.  There is diffuse disease throughout the profunda femoral artery.  The superficial femoral artery is occluded.  The proximal anastomosis is widely patent.  The distal anastomosis is to the below-knee popliteal artery which is widely patent.  There is diffuse disease within the proximal anterior tibial artery and tibioperoneal trunk is widely patent.  There is three-vessel runoff to the ankle.  Right lower extremity:  The right common femoral artery is widely patent.  There is diffuse disease throughout the profunda femoral artery.  Bypass graft is seen originating from the distal superficial femoral artery.  The bypass graft is patent throughout its course. There is distal anastomosis as to the anterior tibial artery of the ankle.  The distal anastomosis is widely patent.  There is good retrograde filling.  The distal anterior tibial artery is somewhat diseased out onto the foot.  INTERVENTION:  At this point in time, decision was made to intervene over a Rosen wire.  The aortic bifurcation was crossed with the 6-French Ansel 1 sheath.  The patient was systemically heparinized.  Wire access was obtained distal to the stenosis of the  bypass graft and initially performed balloon angioplasty using a 3 x 2 balloon taking it to nominal pressure.  Followup study revealed improved but persistent stenosis within the bypass graft.  I then used a 4 x 2 balloon, it took it to burst pressure, held it for 30 seconds.  Completion arteriogram revealed widely patent bypass graft.  At this point, decision was made to terminate the procedure.  Catheters and wires were removed.  The patient was taken to the holding area for sheath pull.  There were no complications.  IMPRESSION: 1. Bypass graft stenosis at the level of a valve leaflet, successfully     dilated using a 4 x 2 balloon. 2. Left proximal and distal anastomoses are widely patent. 3. The right distal anastomosis is widely patent.     Jorge Ny, MD     VWB/MEDQ  D:  07/15/2010  T:  07/16/2010  Job:  [id number]  Electronically Signed by Arelia Longest IV MD on 07/22/2010 01:29:04 PM

## 2010-07-27 NOTE — Procedures (Unsigned)
BYPASS GRAFT EVALUATION  INDICATION:  Follow up bilateral lower extremity grafts.  HISTORY: Diabetes:  No. Cardiac:  No. Hypertension:  Yes. Smoking:  No. Previous Surgery:  Right SFA to ATA graft, 08/09/08; left SFA to popliteal graft in 2006.  SINGLE LEVEL ARTERIAL EXAM                              RIGHT              LEFT Brachial:                    148                134 Anterior tibial:             141                107 Posterior tibial:            145                111 Peroneal: Ankle/brachial index:        0.98               0.75  PREVIOUS ABI:  Date: 12/24/09  RIGHT:  0.95  LEFT:  0.92  LOWER EXTREMITY BYPASS GRAFT DUPLEX EXAM:  DUPLEX: 1. Patent bilateral lower extremity grafts with elevated velocities in     the bilateral proximal anastomoses.  There is a stenotic area     observed in the left proximal portion at an area that appears to be     graft-to-graft anastomosis. 2. The right lower extremity appears to have single vessel run-off.     There are elevated velocities in the distal thigh portion of the     graft. 3. Of note, there is a severe stenosis in the native right anterior     tibial artery at the graft anastomosis.  The true anastomosis,     graft, and outflow appear widely patent; however, given the     retrograde nature of the anterior tibial artery to feed the     collaterals proximal to the graft, it appears that this stenosis     may have significance. 4. Waveform degradation is observed bilaterally.  IMPRESSION: 1. Patent bilateral lower extremity grafts, as described above. 2. The left ankle brachial index has decreased since prior     examination. 3. Please see attached diagram for details.  ___________________________________________ V. Charlena Cross, MD  LT/MEDQ  D:  07/09/2010  T:  07/09/2010  Job:  [id number]

## 2010-08-10 ENCOUNTER — Ambulatory Visit (INDEPENDENT_AMBULATORY_CARE_PROVIDER_SITE_OTHER): Payer: 59 | Admitting: Surgery

## 2010-08-10 ENCOUNTER — Encounter (INDEPENDENT_AMBULATORY_CARE_PROVIDER_SITE_OTHER): Payer: 59

## 2010-08-10 DIAGNOSIS — I739 Peripheral vascular disease, unspecified: Secondary | ICD-10-CM

## 2010-08-10 DIAGNOSIS — Z48812 Encounter for surgical aftercare following surgery on the circulatory system: Secondary | ICD-10-CM

## 2010-08-11 NOTE — Assessment & Plan Note (Signed)
OFFICE VISIT  MARQUETA, PULLEY D DOB:  [birth year]                                       08/10/2010 GNFAO#:[id number]  REASON FOR VISIT:  Followup.  HISTORY:  This is a 65 year old female who has undergone right femoral anterior tibial bypass by Dr. Madilyn Fireman and left femoral below knee bypass by Dr. Madilyn Fireman for limb salvage.  On her surveillance duplex she was found to have elevated velocities within the left groin.  She recently underwent arteriogram and was found a narrowed segment near the arterial anastomosis likely secondary to a valve.  This was angioplastied with good result.  She is back today for followup.  She did have ABIs checked today.  There is no significant change.  Her access site is healing nicely.  She has no complaints at this time.  The patient will be placed back on our ultrasound surveillance protocol. I did review the patient's ultrasound and the images with her.  Total time spent was approximately 30 minutes.    Jorge Ny, MD Electronically Signed  VWB/MEDQ  D:  08/10/2010  T:  08/11/2010  Job:  [id number]

## 2010-11-05 LAB — COMPREHENSIVE METABOLIC PANEL
ALT: 22
AST: 24
Alkaline Phosphatase: 64
CO2: 30
Calcium: 9.3
Chloride: 102
GFR calc Af Amer: >60
GFR calc non Af Amer: >60
Glucose, Bld: 129 — ABNORMAL HIGH
Potassium: 4
Sodium: 139

## 2010-11-05 LAB — CBC
Hemoglobin: 11.3 — ABNORMAL LOW
MCHC: 33.3
RBC: 4.19
WBC: 5.4

## 2010-11-05 LAB — PROTIME-INR: Prothrombin Time: 12.2

## 2010-11-13 LAB — POCT I-STAT, CHEM 8
Calcium, Ion: 1.15
Chloride: 101
HCT: 35 — ABNORMAL LOW
Hemoglobin: 11.9 — ABNORMAL LOW
Potassium: 3.8
Sodium: 139

## 2010-11-26 LAB — COMPREHENSIVE METABOLIC PANEL
ALT: 20
AST: 26
Albumin: 4.1
Alkaline Phosphatase: 65
BUN: 17
CO2: 24
Calcium: 8.9
Chloride: 102
Creatinine, Ser: 0.82
GFR calc Af Amer: >60
GFR calc non Af Amer: >60
Glucose, Bld: 152 — ABNORMAL HIGH
Potassium: 3.5
Sodium: 139
Total Bilirubin: 0.9
Total Protein: 6.7

## 2010-11-26 LAB — APTT: aPTT: 30

## 2010-11-26 LAB — PROTIME-INR
INR: 0.9
Prothrombin Time: 12.7

## 2010-11-26 LAB — CBC
HCT: 34.6 — ABNORMAL LOW
Platelets: 164
WBC: 5.2

## 2010-11-30 ENCOUNTER — Ambulatory Visit (INDEPENDENT_AMBULATORY_CARE_PROVIDER_SITE_OTHER): Payer: 59 | Admitting: Vascular Surgery

## 2010-11-30 DIAGNOSIS — I739 Peripheral vascular disease, unspecified: Secondary | ICD-10-CM

## 2010-11-30 DIAGNOSIS — Z48812 Encounter for surgical aftercare following surgery on the circulatory system: Secondary | ICD-10-CM

## 2010-11-30 DIAGNOSIS — I70219 Atherosclerosis of native arteries of extremities with intermittent claudication, unspecified extremity: Secondary | ICD-10-CM

## 2010-12-29 ENCOUNTER — Encounter: Payer: Self-pay | Admitting: Surgery

## 2010-12-30 ENCOUNTER — Other Ambulatory Visit: Payer: Self-pay | Admitting: Vascular Surgery

## 2010-12-30 DIAGNOSIS — I739 Peripheral vascular disease, unspecified: Secondary | ICD-10-CM

## 2010-12-30 DIAGNOSIS — Z48812 Encounter for surgical aftercare following surgery on the circulatory system: Secondary | ICD-10-CM

## 2011-01-12 ENCOUNTER — Other Ambulatory Visit: Payer: Self-pay | Admitting: *Deleted

## 2011-01-20 ENCOUNTER — Encounter (HOSPITAL_COMMUNITY)
Admission: RE | Disposition: A | Discharge status: Home / Self Care | Payer: Self-pay | Source: Ambulatory Visit | Attending: Surgery

## 2011-01-20 ENCOUNTER — Ambulatory Visit (HOSPITAL_COMMUNITY)
Admission: RE | Admit: 2011-01-20 | Discharge: 2011-01-20 | Disposition: A | Discharge status: Home / Self Care | Payer: 59 | Source: Ambulatory Visit | Attending: Surgery | Admitting: Surgery

## 2011-01-20 ENCOUNTER — Ambulatory Visit (HOSPITAL_COMMUNITY): Admit: 2011-01-20 | Payer: Self-pay | Admitting: Surgery

## 2011-01-20 ENCOUNTER — Encounter (HOSPITAL_COMMUNITY): Payer: Self-pay

## 2011-01-20 DIAGNOSIS — R9389 Abnormal findings on diagnostic imaging of other specified body structures: Secondary | ICD-10-CM | POA: Insufficient documentation

## 2011-01-20 DIAGNOSIS — I739 Peripheral vascular disease, unspecified: Secondary | ICD-10-CM

## 2011-01-20 DIAGNOSIS — R262 Difficulty in walking, not elsewhere classified: Secondary | ICD-10-CM | POA: Insufficient documentation

## 2011-01-20 HISTORY — PX: ABDOMINAL AORTAGRAM: SHX5454

## 2011-01-20 LAB — GLUCOSE, CAPILLARY: Glucose-Capillary: 169 mg/dL — ABNORMAL HIGH (ref 70–99)

## 2011-01-20 SURGERY — ABDOMINAL AORTAGRAM
Anesthesia: LOCAL

## 2011-01-20 MED ORDER — MORPHINE SULFATE 2 MG/ML IJ SOLN: 2.0000 mg | INTRAMUSCULAR | Status: DC | PRN

## 2011-01-20 MED ORDER — LIDOCAINE HCL (PF) 1 % IJ SOLN
INTRAMUSCULAR | Status: AC
  Filled 2011-01-20: qty 30

## 2011-01-20 MED ORDER — SODIUM CHLORIDE 0.9 % IV SOLN: INTRAVENOUS | Status: DC

## 2011-01-20 MED ORDER — HYDRALAZINE HCL 20 MG/ML IJ SOLN: 10.0000 mg | INTRAMUSCULAR | Status: DC | PRN

## 2011-01-20 MED ORDER — ONDANSETRON HCL 4 MG/2ML IJ SOLN: 4.0000 mg | Freq: Four times a day (QID) | INTRAMUSCULAR | Status: DC | PRN

## 2011-01-20 MED ORDER — MIDAZOLAM HCL 2 MG/2ML IJ SOLN
INTRAMUSCULAR | Status: AC
  Filled 2011-01-20: qty 2

## 2011-01-20 MED ORDER — ACETAMINOPHEN 325 MG RE SUPP: 325.0000 mg | RECTAL | Status: DC | PRN

## 2011-01-20 MED ORDER — LABETALOL HCL 5 MG/ML IV SOLN: 10.0000 mg | INTRAVENOUS | Status: DC | PRN

## 2011-01-20 MED ORDER — FENTANYL CITRATE 0.05 MG/ML IJ SOLN
INTRAMUSCULAR | Status: AC
  Filled 2011-01-20: qty 2

## 2011-01-20 MED ORDER — ACETAMINOPHEN 325 MG PO TABS: 325.0000 mg | ORAL_TABLET | ORAL | Status: DC | PRN

## 2011-01-20 MED ORDER — METOPROLOL TARTRATE 1 MG/ML IV SOLN: 2.0000 mg | INTRAVENOUS | Status: DC | PRN

## 2011-01-20 MED ORDER — HEPARIN (PORCINE) IN NACL 2-0.9 UNIT/ML-% IJ SOLN
INTRAMUSCULAR | Status: AC
  Filled 2011-01-20: qty 1000

## 2011-01-20 MED ORDER — OXYCODONE-ACETAMINOPHEN 5-325 MG PO TABS: 1.0000 | ORAL_TABLET | ORAL | Status: DC | PRN

## 2011-01-20 MED ORDER — SODIUM CHLORIDE 0.9 % IV SOLN
INTRAVENOUS | Status: DC
  Administered 2011-01-20: 07:00:00 via INTRAVENOUS

## 2011-01-20 NOTE — Op Note (Signed)
[facility] [place]  Patient name: Paula [name] MRN: [id number] DOB: 1945/11/04 Sex: female  01/20/2011 Pre-operative Diagnosis: Decreased in left ABI during surveillance ultrasound Post-operative diagnosis:  Same Surgeon:  Jorge Ny Procedure Performed:  1.  ultrasound access right femoral artery  2.  abdominal aortogram  3.  bilateral lower extremity runoff  4.  second order catheterization     Indications:  The patient comes in today for diagnostic evaluation and possible intervention. She is status post bilateral lower extremity bypass graft. Ultrasound revealed a decrease in the ABI on the left. She has had a percutaneous intervention earlier this year on the left leg bypass she is in today for further evaluation.  Procedure:  The patient was identified in the holding area and taken to room 8.  The patient was then placed supine on the table and prepped and draped in the usual sterile fashion.  A time out was called.  Ultrasound was used to evaluate the right common femoral artery.  It was patent .  A digital ultrasound image was acquired.  A micropuncture needle was used to access the right common femoral artery under ultrasound guidance.  An 018 wire was advanced without resistance and a micropuncture sheath was placed.  The 018 wire was removed and a benson wire was placed.  The micropuncture sheath was exchanged for a 5 french sheath.  An omniflush catheter was advanced over the wire to the level of L-1.  An abdominal angiogram was obtained.  Next, using the omniflush catheter and a benson wire and a 4 French endhole catheter, the aortic bifurcation was crossed and the catheter was placed into theleft external iliac artery and left runoff was obtained.  right runoff was performed via retrograde sheath injections.  Findings:   Aortogram:  The suprarenal abdominal aorta shows no significant disease. There are single renal arteries bilaterally which  are widely patent. The infrarenal abdominal aorta is widely patent. Bilateral common and external iliac arteries are widely patent.  Right Lower Extremity:  The right common femoral artery is patent. There is a bypass graft originating from the proximal superficial femoral artery down to the anterior tibial artery at the ankle. The bypass graft is widely patent without areas of stenosis. Both the proximal and distal anastomosis are widely patent. There is persistent disease out onto the foot.  Left Lower Extremity:  The left common femoral artery is patent as is the profunda femoral artery. A bypass graft is visualized from the common femoral artery to the below-knee popliteal artery. There is a tortuous course of the bypass graft near the proximal anastomosis. This is the region that has been intervened in previously. Multiple obliquities were performed to violate this portion of the graft. No stenosis was identified. There does however appear to be progression of the tibial disease. All 3 tibial vessels remain patent but there has been progression of disease.  Intervention:  No intervention was performed today.  Impression:  #1  widely patent right femoral to distal anterior tibial artery bypass graft  #2  widely patent left femoral below-knee popliteal bypass graft with a progression of disease in the tibial vessels  #3  no aortoiliac occlusive disease    V. Durene Cal, M.D. [facility] [place] Office: [phone] Pager:  [phone]

## 2011-01-20 NOTE — H&P (Signed)
  [facility] [place]   Patient name: Paula [name] MRN: [id number] DOB: 1945/12/09 Sex: female    No chief complaint on file.   HISTORY OF PRESENT ILLNESS: The patient comes in today for angiogram to follow up her previous vascular surgery.  She came to the office several weeks ago and was found to have a significant drop in her ABI on the left.  She is having problems walking.  She denies ulcers  No past medical history on file.  No past surgical history on file.  History   Social History  . Marital Status: Married    Spouse Name: N/A    Number of Children: N/A  . Years of Education: N/A   Occupational History  . Not on file.   Social History Main Topics  . Smoking status: Not on file  . Smokeless tobacco: Not on file  . Alcohol Use: Not on file  . Drug Use: Not on file  . Sexually Active: Not on file   Other Topics Concern  . Not on file   Social History Narrative  . No narrative on file    No family history on file.  Allergies as of 01/12/2011  . (Not on File)    No current facility-administered medications on file prior to encounter.   No current outpatient prescriptions on file prior to encounter.     PHYSICAL EXAMINATION:   Vital signs are BP 120/70  Pulse 79  Temp(Src) 98.3 F (36.8 C) (Oral)  Resp 20  Ht 5\' 4"  (1.626 m)  Wt 220 lb (99.791 kg)  BMI 37.76 kg/m2 General: The patient appears their stated age. HEENT:  No gross abnormalities Pulmonary:  Non labored breathing Abdomen: Soft and non-tender Musculoskeletal: There are no major deformities. Neurologic: No focal weakness or paresthesias are detected, Skin: There are no ulcer or rashes noted. Psychiatric: The patient has normal affect. Cardiovascular: There is a regular rate and rhythm without significant murmur appreciated.   Assessment: PAD.  S/p B bypass with decrease ABI on Left  Plan: Angiogram with bilateral runoff today with possible intervention  on the left  V. Charlena Cross, M.D. [facility] [place] Office: [phone] Pager:  [phone]

## 2011-01-21 ENCOUNTER — Other Ambulatory Visit: Payer: Self-pay

## 2011-01-21 DIAGNOSIS — I739 Peripheral vascular disease, unspecified: Secondary | ICD-10-CM

## 2011-01-21 LAB — POCT I-STAT, CHEM 8
BUN: 25 mg/dL — ABNORMAL HIGH (ref 6–23)
Chloride: 103 mEq/L (ref 96–112)
Creatinine, Ser: 0.8 mg/dL (ref 0.50–1.10)
Sodium: 140 mEq/L (ref 135–145)
TCO2: 26 mmol/L (ref 0–100)

## 2011-03-06 ENCOUNTER — Encounter: Payer: Self-pay | Admitting: Emergency Medicine

## 2011-03-06 ENCOUNTER — Emergency Department
Admission: EM | Admit: 2011-03-06 | Discharge: 2011-03-06 | Disposition: A | Discharge status: Home / Self Care | Payer: 59 | Source: Home / Self Care | Attending: Emergency Medicine | Admitting: Emergency Medicine

## 2011-03-06 DIAGNOSIS — J209 Acute bronchitis, unspecified: Secondary | ICD-10-CM

## 2011-03-06 DIAGNOSIS — R059 Cough, unspecified: Secondary | ICD-10-CM

## 2011-03-06 DIAGNOSIS — R05 Cough: Secondary | ICD-10-CM

## 2011-03-06 HISTORY — DX: Essential (primary) hypertension: I10

## 2011-03-06 HISTORY — DX: Hyperlipidemia, unspecified: E78.5

## 2011-03-06 HISTORY — DX: Peripheral vascular disease, unspecified: I73.9

## 2011-03-06 MED ORDER — AZITHROMYCIN 250 MG PO TABS: ORAL_TABLET | ORAL | Status: AC

## 2011-03-06 MED ORDER — HYDROCODONE-HOMATROPINE 5-1.5 MG/5ML PO SYRP: 5.0000 mL | ORAL_SOLUTION | Freq: Four times a day (QID) | ORAL | Status: AC | PRN

## 2011-03-06 NOTE — ED Provider Notes (Signed)
History     CSN: [id number]  Arrival date & time 03/06/11  1356   First MD Initiated Contact with Patient 03/06/11 1415      Chief Complaint  Patient presents with  . Nasal Congestion    (Consider location/radiation/quality/duration/timing/severity/associated sxs/prior treatment) HPI [place] is a 66 y.o. female who complains of onset of cold symptoms for 3 days.  No sore throat + cough No pleuritic pain No wheezing + nasal congestion +post-nasal drainage + sinus pain/pressure + hoarseness +chest congestion No itchy/red eyes No earache No hemoptysis No SOB No chills/sweats No fever No nausea No vomiting No abdominal pain No diarrhea No skin rashes No fatigue No myalgias No headache    History reviewed. No pertinent past medical history.  No past surgical history on file.  No family history on file.  History  Substance Use Topics  . Smoking status: Not on file  . Smokeless tobacco: Not on file  . Alcohol Use: Not on file    OB History    Grav Para Term Preterm Abortions TAB SAB Ect Mult Living                  Review of Systems  Allergies  Codeine  Home Medications   Current Outpatient Rx  Name Route Sig Dispense Refill  . ASPIRIN EC 325 MG PO TBEC Oral Take 325 mg by mouth daily.      Marland Kitchen BENAZEPRIL-HYDROCHLOROTHIAZIDE 10-12.5 MG PO TABS Oral Take 1 tablet by mouth daily.      Marland Kitchen CLOPIDOGREL BISULFATE 75 MG PO TABS Oral Take 75 mg by mouth daily.      Marland Kitchen EZETIMIBE-SIMVASTATIN 10-40 MG PO TABS Oral Take 1 tablet by mouth at bedtime.      Marland Kitchen METFORMIN HCL 500 MG PO TABS Oral Take 500 mg by mouth 2 (two) times daily with a meal.      . METOPROLOL TARTRATE 25 MG PO TABS Oral Take 25 mg by mouth 2 (two) times daily.      Marland Kitchen OMEPRAZOLE 20 MG PO CPDR Oral Take 20 mg by mouth daily.        BP 168/78  Pulse 110  Temp(Src) 98.6 F (37 C) (Oral)  Resp 18  Ht 5\' 4"  (1.626 m)  Wt 220 lb (99.791 kg)  BMI 37.76 kg/m2  SpO2 94%  Physical Exam    Nursing note and vitals reviewed. Constitutional: She is oriented to person, place, and time. She appears well-developed and well-nourished.  HENT:  Head: Normocephalic and atraumatic.  Right Ear: Tympanic membrane, external ear and ear canal normal.  Left Ear: Tympanic membrane, external ear and ear canal normal.  Nose: Mucosal edema and rhinorrhea present.  Mouth/Throat: Posterior oropharyngeal erythema present. No oropharyngeal exudate or posterior oropharyngeal edema.  Eyes: No scleral icterus.  Neck: Neck supple.  Cardiovascular: Regular rhythm and normal heart sounds.   Pulmonary/Chest: Effort normal. No accessory muscle usage. No respiratory distress. She has no decreased breath sounds. She has wheezes (Mild tightness bilaterally). She has no rhonchi. She has no rales.  Neurological: She is alert and oriented to person, place, and time.  Skin: Skin is warm and dry.  Psychiatric: She has a normal mood and affect. Her speech is normal.    ED Course  Procedures (including critical care time)  Labs Reviewed - No data to display No results found.   No diagnosis found.    MDM  1)  Take the prescribed antibiotic as instructed. I believe that she  should start the antibiotic today. If she is not getting better or is getting worse, a chest x-ray may be appropriate at that time. 2)  Use nasal saline solution (over the counter) at least 3 times a day. 3)  Use over the counter decongestants like Zyrtec-D every 12 hours as needed to help with congestion.  If you have hypertension, do not take medicines with sudafed.  4)  Can take tylenol every 6 hours or motrin every 8 hours for pain or fever. 5)  Follow up with your primary doctor if no improvement in 5-7 days, sooner if increasing pain, fever, or new symptoms.     Lily Kocher, MD 03/06/11 1426

## 2011-03-06 NOTE — ED Notes (Signed)
Congestion, cough, wondering if bronchitis.x 3 days.

## 2011-04-19 ENCOUNTER — Ambulatory Visit: Payer: 59 | Admitting: Surgery

## 2011-05-21 ENCOUNTER — Encounter: Payer: Self-pay | Admitting: Surgery

## 2011-05-24 ENCOUNTER — Encounter: Payer: Self-pay | Admitting: Surgery

## 2011-05-24 ENCOUNTER — Ambulatory Visit (INDEPENDENT_AMBULATORY_CARE_PROVIDER_SITE_OTHER): Payer: 59 | Admitting: Surgery

## 2011-05-24 ENCOUNTER — Encounter (INDEPENDENT_AMBULATORY_CARE_PROVIDER_SITE_OTHER): Payer: 59 | Admitting: *Deleted

## 2011-05-24 VITALS — BP 156/68 | HR 86 | Temp 98.3°F | Resp 16 | Ht 64.0 in | Wt 222.0 lb

## 2011-05-24 DIAGNOSIS — I70409 Unspecified atherosclerosis of autologous vein bypass graft(s) of the extremities, unspecified extremity: Secondary | ICD-10-CM

## 2011-05-24 DIAGNOSIS — I739 Peripheral vascular disease, unspecified: Secondary | ICD-10-CM

## 2011-05-24 DIAGNOSIS — Z48812 Encounter for surgical aftercare following surgery on the circulatory system: Secondary | ICD-10-CM

## 2011-05-24 DIAGNOSIS — I70219 Atherosclerosis of native arteries of extremities with intermittent claudication, unspecified extremity: Secondary | ICD-10-CM | POA: Insufficient documentation

## 2011-05-24 NOTE — Progress Notes (Signed)
[facility] [place]   Patient name: Paula [name] MRN: [id number] DOB: 01/04/1946 Sex: female     Chief Complaint  Patient presents with  . PVD    F/up bil le runoff/abd aortogram 01/21/11 with Labs    HISTORY OF PRESENT ILLNESS: The patient comes back today for followup. She is well-known to our office. She is status post right femoral to anterior tibial bypass graft by Dr. Madilyn Fireman as well as a left femoral to below knee popliteal artery bypass graft by Dr. Madilyn Fireman. These were both done for limb salvage. She recently was found to have a stenosis potentially on the left leg and underwent arteriogram in December. I was unable to detect any hemodynamically significant lesions. No intervention was performed. She is back today for followup. She is no evidence of ulceration. She has no complaints of claudication.  Past Medical History  Diagnosis Date  . Hypertension   . Diabetes mellitus   . Hyperlipidemia   . PVD (peripheral vascular disease)     Past Surgical History  Procedure Date  . Tonsillectomy   . Carpal tunnel release   . Tubal ligation   . Femerol popliteal   . Colon resection     History   Social History  . Marital Status: Married    Spouse Name: N/A    Number of Children: N/A  . Years of Education: N/A   Occupational History  . Not on file.   Social History Main Topics  . Smoking status: Never Smoker   . Smokeless tobacco: Not on file  . Alcohol Use: No  . Drug Use:   . Sexually Active:    Other Topics Concern  . Not on file   Social History Narrative  . No narrative on file    History reviewed. No pertinent family history.  Allergies as of 05/24/2011 - Review Complete 05/24/2011  Allergen Reaction Noted  . Codeine Nausea And Vomiting 01/20/2011    Current Outpatient Prescriptions on File Prior to Visit  Medication Sig Dispense Refill  . aspirin EC 325 MG tablet Take 325 mg by mouth daily.        . clopidogrel (PLAVIX)  75 MG tablet Take 75 mg by mouth daily.        Marland Kitchen ezetimibe-simvastatin (VYTORIN) 10-40 MG per tablet Take 1 tablet by mouth at bedtime.        . metFORMIN (GLUCOPHAGE) 500 MG tablet Take 500 mg by mouth 2 (two) times daily with a meal.        . metoprolol tartrate (LOPRESSOR) 25 MG tablet Take 25 mg by mouth 2 (two) times daily.        Marland Kitchen olmesartan-hydrochlorothiazide (BENICAR HCT) 20-12.5 MG per tablet Take 1 tablet by mouth daily.      Marland Kitchen omeprazole (PRILOSEC) 20 MG capsule Take 20 mg by mouth daily.        . benazepril-hydrochlorthiazide (LOTENSIN HCT) 10-12.5 MG per tablet Take 1 tablet by mouth daily.           REVIEW OF SYSTEMS: Positive for swelling in her legs and varicose veins, dizziness. All other systems are negative as documented in the encounter for  PHYSICAL EXAMINATION:   Vital signs are BP 156/68  Pulse 86  Temp(Src) 98.3 F (36.8 C) (Oral)  Resp 16  Ht 5\' 4"  (1.626 m)  Wt 222 lb (100.699 kg)  BMI 38.11 kg/m2 General: The patient appears their stated age. HEENT:  No gross abnormalities Pulmonary:  Non labored breathing Musculoskeletal: There are no major deformities. Neurologic: No focal weakness or paresthesias are detected, Skin: There are no ulcer or rashes noted. Psychiatric: The patient has normal affect. Cardiovascular: Palpable right dorsalis pedis pulse, nonpalpable left   Diagnostic Studies Duplex today shows a ABI of 1.0 on the right and 0.84 on the left. No significant velocity elevations are identified on the right. On the left the bypass graft is widely patent however there is a velocity elevation within the native posterior tibial artery of 125 cm/s.  Assessment: Peripheral vascular disease, status post bypass Plan: I have re-reviewed her most recent arteriogram. I feel that the velocity elevation on duplex today correlates with native stenosis most likely in the tibioperoneal trunk near the anterior tibial takeoff. Because this is within the native  vessel I would not recommend intervention right now as the patient is without symptoms. She'll continue with her ultrasound surveillance protocol, her next  scan will be in 3 months  V. Charlena Cross, M.D. [facility] [place] Office: [phone] Pager:  [phone]

## 2011-05-31 NOTE — Procedures (Unsigned)
BYPASS GRAFT EVALUATION  INDICATION:  Follow up bilateral bypass grafts.  HISTORY: Diabetes:  No. Cardiac:  No. Hypertension:  Yes. Smoking:  No. Previous Surgery:  Right femoral-to-anterior tibial bypass graft; multiple PTA procedures in 2007 to 2009.  Left fem-pop bypass graft 2007.  SINGLE LEVEL ARTERIAL EXAM                              RIGHT              LEFT Brachial: Anterior tibial: Posterior tibial: Peroneal: Ankle/brachial index:        05/24/2011 1.20    05/24/2011 0.84  PREVIOUS ABI:  Date: 11/30/2010  RIGHT:  0.88  LEFT:  0.55  LOWER EXTREMITY BYPASS GRAFT DUPLEX EXAM:  DUPLEX:  Patent bilateral bypass grafts with an elevated velocity at the right distal native anterior tibial artery (247 cm/s).  The right distal anastomosis appears patent; however, there is an increased velocity (455 cm/s) at a steep curve that appears to feed collaterals.  An elevated velocity (236 cm/s) is noted at the left proximal anastomosis, as noted on previous study 07/08/2010.  A new finding is an increased velocity (425 cm/s) in the distal native artery on the left.  IMPRESSION: 1. Patent bilateral lower extremity bypass grafts as described above. 2. A new finding is severe increase of velocity in the distal native     artery on the left. 3. Please see attached diagram for details.  ___________________________________________ V. Charlena Cross, MD  SS/MEDQ  D:  05/25/2011  T:  05/25/2011  Job:  [id number]

## 2011-06-21 ENCOUNTER — Other Ambulatory Visit: Payer: 59

## 2011-08-20 ENCOUNTER — Encounter: Payer: Self-pay | Admitting: Neurosurgery

## 2011-08-23 ENCOUNTER — Ambulatory Visit: Payer: 59 | Admitting: Neurosurgery

## 2012-08-24 ENCOUNTER — Emergency Department (INDEPENDENT_AMBULATORY_CARE_PROVIDER_SITE_OTHER): Payer: 59

## 2012-08-24 ENCOUNTER — Encounter: Payer: Self-pay | Admitting: *Deleted

## 2012-08-24 ENCOUNTER — Emergency Department
Admission: EM | Admit: 2012-08-24 | Discharge: 2012-08-24 | Disposition: A | Discharge status: Home / Self Care | Payer: 59 | Source: Home / Self Care | Attending: Family Medicine | Admitting: Family Medicine

## 2012-08-24 DIAGNOSIS — S20211A Contusion of right front wall of thorax, initial encounter: Secondary | ICD-10-CM

## 2012-08-24 DIAGNOSIS — S20219A Contusion of unspecified front wall of thorax, initial encounter: Secondary | ICD-10-CM

## 2012-08-24 DIAGNOSIS — R079 Chest pain, unspecified: Secondary | ICD-10-CM

## 2012-08-24 HISTORY — DX: Gastro-esophageal reflux disease without esophagitis: K21.9

## 2012-08-24 MED ORDER — TRAMADOL HCL 50 MG PO TABS: 50.0000 mg | ORAL_TABLET | Freq: Every evening | ORAL | Status: DC | PRN

## 2012-08-24 NOTE — ED Notes (Signed)
Pt c/o RT mid back pain x 3 days ago, post fall. She has taken hydrocodone, tylenol and applied heat with no relief.

## 2012-08-24 NOTE — ED Provider Notes (Signed)
History    CSN: [id number] Arrival date & time 08/24/12  1601  First MD Initiated Contact with Patient 08/24/12 1631     Chief Complaint  Patient presents with  . Back Pain     HPI Comments: Patient was leading her two large dogs on leashes two days ago when she tripped on a root, twisting her back and landing on her right posterior chest.  She has had persistent right posterior rib pain, worse with movement and deep inspiration.  No shortness of breath. She has a past history of rib fractures.  Patient is a 67 y.o. female presenting with chest pain. The history is provided by the patient.  Chest Pain Chest pain location: right posterior chest. Pain quality: sharp   Pain radiates to:  Does not radiate Pain severity:  Moderate Onset quality:  Sudden Duration:  2 days Timing:  Intermittent Progression:  Unchanged Chronicity:  New Context: breathing and lifting   Relieved by:  Nothing Worsened by:  Coughing, deep breathing and movement Ineffective treatments: heat application. Associated symptoms: no abdominal pain, no back pain, no cough, no diaphoresis, no fatigue, no fever, no nausea and no shortness of breath   Risk factors: obesity    Past Medical History  Diagnosis Date  . Hypertension   . Diabetes mellitus   . Hyperlipidemia   . PVD (peripheral vascular disease)   . GERD (gastroesophageal reflux disease)    Past Surgical History  Procedure Laterality Date  . Tonsillectomy    . Carpal tunnel release    . Tubal ligation    . Femerol popliteal    . Colon resection    . Adenoidectomy    . Tarsal tunnel release     Family History  Problem Relation Age of Onset  . Dementia Mother   . Atrial fibrillation Mother   . Glaucoma Mother   . Hiatal hernia Mother   . Heart disease Father   . AAA (abdominal aortic aneurysm) Father    History  Substance Use Topics  . Smoking status: Never Smoker   . Smokeless tobacco: Not on file  . Alcohol Use: Yes   OB History     Grav Para Term Preterm Abortions TAB SAB Ect Mult Living                 Review of Systems  Constitutional: Negative for fever, diaphoresis and fatigue.  Respiratory: Negative for cough and shortness of breath.   Cardiovascular: Positive for chest pain.  Gastrointestinal: Negative for nausea and abdominal pain.  Musculoskeletal: Negative for back pain.  All other systems reviewed and are negative.    Allergies  Codeine  Home Medications   Current Outpatient Rx  Name  Route  Sig  Dispense  Refill  . aspirin EC 325 MG tablet   Oral   Take 325 mg by mouth daily.           . benazepril-hydrochlorthiazide (LOTENSIN HCT) 10-12.5 MG per tablet   Oral   Take 1 tablet by mouth daily.           . clopidogrel (PLAVIX) 75 MG tablet   Oral   Take 75 mg by mouth daily.           Marland Kitchen ezetimibe-simvastatin (VYTORIN) 10-40 MG per tablet   Oral   Take 1 tablet by mouth at bedtime.           . metFORMIN (GLUCOPHAGE) 500 MG tablet   Oral   Take  500 mg by mouth 2 (two) times daily with a meal.           . metoprolol tartrate (LOPRESSOR) 25 MG tablet   Oral   Take 25 mg by mouth 2 (two) times daily.           Marland Kitchen olmesartan-hydrochlorothiazide (BENICAR HCT) 20-12.5 MG per tablet   Oral   Take 1 tablet by mouth daily.         Marland Kitchen omeprazole (PRILOSEC) 20 MG capsule   Oral   Take 20 mg by mouth daily.           . traMADol (ULTRAM) 50 MG tablet   Oral   Take 1 tablet (50 mg total) by mouth at bedtime as needed for pain.   12 tablet   0    BP 134/82  Pulse 81  Temp(Src) 98.5 F (36.9 C) (Oral)  Resp 16  Wt 219 lb (99.338 kg)  BMI 37.57 kg/m2  SpO2 97% Physical Exam  Nursing note and vitals reviewed. Constitutional: She is oriented to person, place, and time. She appears well-developed and well-nourished. No distress.  Patient is obese (BMI 37.6)  HENT:  Head: Normocephalic and atraumatic.  Eyes: Conjunctivae are normal. Pupils are equal, round, and  reactive to light.  Neck: Normal range of motion.  Cardiovascular: Regular rhythm and normal heart sounds.   Pulmonary/Chest: Effort normal and breath sounds normal. No respiratory distress. She has no wheezes. She has no rales.   She exhibits tenderness.  There is tenderness right posterior ribs as noted on diagram.  No swelling or ecchymosis.  Abdominal: There is no tenderness.  Neurological: She is alert and oriented to person, place, and time.  Skin: Skin is warm and dry. No rash noted.    ED Course  Procedures  none  Labs Reviewed -   Dg Ribs Unilateral W/chest Right  08/24/2012   *RADIOLOGY REPORT*  Clinical Data: Right chest pain since falling 3 days ago.  RIGHT RIBS AND CHEST - 3+ VIEW  Comparison: Chest radiographs 05/14/2008.  Findings: There is an old healed fracture of the right eighth rib laterally. An old fracture is also noted on the left.  No acute fracture, pleural effusion or pneumothorax is identified.  Inferior rib visualization is limited by body habitus.  The heart size and mediastinal contours are stable. The lungs appear clear.  IMPRESSION: Old rib fractures bilaterally.  No evidence of acute fracture, pleural effusion or pneumothorax.   Original Report Authenticated By: Carey Bullocks, M.D.   1. Contusion of ribs, right, initial encounter     MDM  Dispensed rib belt.  Tramadol for pain at bedtime. Wear rib belt daytime.  Apply ice pack several times daily.  Take Tylenol daytime for pain. Followup with [facility] if not improving about two weeks.   Lattie Haw, MD 08/24/12 [phone]

## 2012-10-20 ENCOUNTER — Other Ambulatory Visit: Payer: Self-pay | Admitting: *Deleted

## 2012-10-20 DIAGNOSIS — I739 Peripheral vascular disease, unspecified: Secondary | ICD-10-CM

## 2012-10-20 DIAGNOSIS — Z48812 Encounter for surgical aftercare following surgery on the circulatory system: Secondary | ICD-10-CM

## 2012-11-06 ENCOUNTER — Encounter: Payer: Self-pay | Admitting: Family

## 2012-11-07 ENCOUNTER — Ambulatory Visit (INDEPENDENT_AMBULATORY_CARE_PROVIDER_SITE_OTHER): Payer: 59 | Admitting: Family

## 2012-11-07 ENCOUNTER — Encounter: Payer: Self-pay | Admitting: Family

## 2012-11-07 ENCOUNTER — Encounter (INDEPENDENT_AMBULATORY_CARE_PROVIDER_SITE_OTHER): Payer: 59 | Admitting: *Deleted

## 2012-11-07 VITALS — BP 139/77 | HR 79 | Resp 16 | Ht 64.0 in | Wt 215.0 lb

## 2012-11-07 DIAGNOSIS — Z48812 Encounter for surgical aftercare following surgery on the circulatory system: Secondary | ICD-10-CM

## 2012-11-07 DIAGNOSIS — I739 Peripheral vascular disease, unspecified: Secondary | ICD-10-CM | POA: Insufficient documentation

## 2012-11-07 DIAGNOSIS — R209 Unspecified disturbances of skin sensation: Secondary | ICD-10-CM

## 2012-11-07 DIAGNOSIS — M79609 Pain in unspecified limb: Secondary | ICD-10-CM

## 2012-11-07 DIAGNOSIS — R200 Anesthesia of skin: Secondary | ICD-10-CM | POA: Insufficient documentation

## 2012-11-07 NOTE — Patient Instructions (Addendum)
Peripheral Vascular Disease Peripheral Vascular Disease (PVD), also called Peripheral Arterial Disease (PAD), is a circulation problem caused by cholesterol (atherosclerotic plaque) deposits in the arteries. PVD commonly occurs in the lower extremities (legs) but it can occur in other areas of the body, such as your arms. The cholesterol buildup in the arteries reduces blood flow which can cause pain and other serious problems. The presence of PVD can place a person at risk for Coronary Artery Disease (CAD).  CAUSES  Causes of PVD can be many. It is usually associated with more than one risk factor such as:   High Cholesterol.  Smoking.  Diabetes.  Lack of exercise or inactivity.  High blood pressure (hypertension).  Obesity.  Family history. SYMPTOMS   When the lower extremities are affected, patients with PVD may experience:  Leg pain with exertion or physical activity. This is called INTERMITTENT CLAUDICATION. This may present as cramping or numbness with physical activity. The location of the pain is associated with the level of blockage. For example, blockage at the abdominal level (distal abdominal aorta) may result in buttock or hip pain. Lower leg arterial blockage may result in calf pain.  As PVD becomes more severe, pain can develop with less physical activity.  In people with severe PVD, leg pain may occur at rest.  Other PVD signs and symptoms:  Leg numbness or weakness.  Coldness in the affected leg or foot, especially when compared to the other leg.  A change in leg color.  Patients with significant PVD are more prone to ulcers or sores on toes, feet or legs. These may take longer to heal or may reoccur. The ulcers or sores can become infected.  If signs and symptoms of PVD are ignored, gangrene may occur. This can result in the loss of toes or loss of an entire limb.  Not all leg pain is related to PVD. Other medical conditions can cause leg pain such  as:  Blood clots (embolism) or Deep Vein Thrombosis.  Inflammation of the blood vessels (vasculitis).  Spinal stenosis. DIAGNOSIS  Diagnosis of PVD can involve several different types of tests. These can include:  Pulse Volume Recording Method (PVR). This test is simple, painless and does not involve the use of X-rays. PVR involves measuring and comparing the blood pressure in the arms and legs. An ABI (Ankle-Brachial Index) is calculated. The normal ratio of blood pressures is 1. As this number becomes smaller, it indicates more severe disease.  < 0.95  indicates significant narrowing in one or more leg vessels.  <0.8 there will usually be pain in the foot, leg or buttock with exercise.  <0.4 will usually have pain in the legs at rest.  <0.25  usually indicates limb threatening PVD.  Doppler detection of pulses in the legs. This test is painless and checks to see if you have a pulses in your legs/feet.  A dye or contrast material (a substance that highlights the blood vessels so they show up on x-ray) may be given to help your caregiver better see the arteries for the following tests. The dye is eliminated from your body by the kidney's. Your caregiver may order blood work to check your kidney function and other laboratory values before the following tests are performed:  Magnetic Resonance Angiography (MRA). An MRA is a picture study of the blood vessels and arteries. The MRA machine uses a large magnet to produce images of the blood vessels.  Computed Tomography Angiography (CTA). A CTA is a   specialized x-ray that looks at how the blood flows in your blood vessels. An IV may be inserted into your arm so contrast dye can be injected.  Angiogram. Is a procedure that uses x-rays to look at your blood vessels. This procedure is minimally invasive, meaning a small incision (cut) is made in your groin. A small tube (catheter) is then inserted into the artery of your groin. The catheter is  guided to the blood vessel or artery your caregiver wants to examine. Contrast dye is injected into the catheter. X-rays are then taken of the blood vessel or artery. After the images are obtained, the catheter is taken out. TREATMENT  Treatment of PVD involves many interventions which may include:  Lifestyle changes:  Quitting smoking.  Exercise.  Following a low fat, low cholesterol diet.  Control of diabetes.  Foot care is very important to the PVD patient. Good foot care can help prevent infection.  Medication:  Cholesterol-lowering medicine.  Blood pressure medicine.  Anti-platelet drugs.  Certain medicines may reduce symptoms of Intermittent Claudication.  Interventional/Surgical options:  Angioplasty. An Angioplasty is a procedure that inflates a balloon in the blocked artery. This opens the blocked artery to improve blood flow.  Stent Implant. A wire mesh tube (stent) is placed in the artery. The stent expands and stays in place, allowing the artery to remain open.  Peripheral Bypass Surgery. This is a surgical procedure that reroutes the blood around a blocked artery to help improve blood flow. This type of procedure may be performed if Angioplasty or stent implants are not an option. SEEK IMMEDIATE MEDICAL CARE IF:   You develop pain or numbness in your arms or legs.  Your arm or leg turns cold, becomes blue in color.  You develop redness, warmth, swelling and pain in your arms or legs. MAKE SURE YOU:   Understand these instructions.  Will watch your condition.  Will get help right away if you are not doing well or get worse. Document Released: 03/11/2004 Document Revised: 04/26/2011 Document Reviewed: 02/06/2008 [employer] Patient Information 2014 [employer].  

## 2012-11-07 NOTE — Progress Notes (Signed)
[facility] HISTORY AND PHYSICAL -PAD    History of Present Illness Paula [name] is a 67 y.o. female patient of Dr. Myra Gianotti. She is status post right femoral to anterior tibial bypass graft by Dr. Madilyn Fireman as well as a left femoral to below knee popliteal artery bypass graft by Dr. Madilyn Fireman. These were both done for limb salvage. She underwent an arteriogram in Dec., 2012 which demonstrates native stenosis most likely in the tibioperoneal trunk near the anterior tibial takeoff. Because this was within the native vessel Dr. Myra Gianotti did not recommend intervention right then as the patient was without symptoms. Is a newly diagnosed diabetic, states her last A1C was 7.1 or 7.2. Has had tingling in feet since vascular surgeries.    Pt. denies claudication in legs or feet.  Pt. denies rest pain; denies night pain denies non healing ulcers on lower extremities.  denies New Medical or Surgical History   Pt Diabetic: Yes Pt smoker: non-smoker  Pt meds include: Statin :Yes Betablocker: No ASA: Yes Other anticoagulants/antiplatelets: Plavix  Past Medical History  Diagnosis Date  . Hypertension   . Diabetes mellitus   . Hyperlipidemia   . PVD (peripheral vascular disease)   . GERD (gastroesophageal reflux disease)   . Anemia   . Varicose veins     Social History History  Substance Use Topics  . Smoking status: Never Smoker   . Smokeless tobacco: Never Used  . Alcohol Use: Yes    Family History Family History  Problem Relation Age of Onset  . Dementia Mother   . Atrial fibrillation Mother   . Hiatal hernia Mother   . Hyperlipidemia Mother   . Hypertension Mother   . Other Mother     Varicose veins  . Heart disease Father   . AAA (abdominal aortic aneurysm) Father   . Hyperlipidemia Father     Past Surgical History  Procedure Laterality Date  . Tonsillectomy    . Carpal tunnel release    . Tubal ligation    . Femerol popliteal Left  Jan 2007  . Colon resection    . Adenoidectomy    . Tarsal tunnel release    . Femoral bypass Left 2005  . Vein patch Left May 2007    Left leg vein patch   . Anterior tibial bpg Right 2010    Fem-to- anterior Tibial    Allergies  Allergen Reactions  . Codeine Nausea And Vomiting    Current Outpatient Prescriptions  Medication Sig Dispense Refill  . aspirin EC 325 MG tablet Take 325 mg by mouth daily.        . benazepril-hydrochlorthiazide (LOTENSIN HCT) 10-12.5 MG per tablet Take 1 tablet by mouth daily.        . clopidogrel (PLAVIX) 75 MG tablet Take 75 mg by mouth daily.        Marland Kitchen ezetimibe-simvastatin (VYTORIN) 10-40 MG per tablet Take 1 tablet by mouth at bedtime.        . metFORMIN (GLUCOPHAGE) 500 MG tablet Take 500 mg by mouth 2 (two) times daily with a meal.        . metoprolol tartrate (LOPRESSOR) 25 MG tablet Take 25 mg by mouth 2 (two) times daily.        Marland Kitchen olmesartan-hydrochlorothiazide (BENICAR HCT) 20-12.5 MG per tablet Take 1 tablet by mouth daily.      Marland Kitchen omeprazole (PRILOSEC) 20 MG capsule Take 20 mg by mouth daily.        Marland Kitchen  traMADol (ULTRAM) 50 MG tablet Take 1 tablet (50 mg total) by mouth at bedtime as needed for pain.  12 tablet  0   No current facility-administered medications for this visit.    ROS: [x]  Positive   [ ]  Denies  General:[ ]  Weight loss,  [ ]  Weight gain, [ ]  Fever, [ ]  chills Neurologic: [ ]  Dizziness, [ ]  Blackouts, [ ]  Seizure [ ]  Stroke, [ ]  "Mini stroke", [ ]  Slurred speech, [ ]  Temporary blindness;  [ ] weakness, [ ]  Hoarseness Cardiac: [ ]  Chest pain/pressure, [ ]  Shortness of breath at rest [ ]  Shortness of breath with exertion,  [ ]   Atrial fibrillation or irregular heartbeat Vascular:[ ]  Pain in legs with walking, [ ]  Pain in legs at rest ,[ ]  Pain in legs at night,  [ ]   Non-healing ulcer, [ ]  Blood clot in vein/DVT,   Pulmonary: [ ]  Home oxygen, [ ]   Productive cough, [ ]  Coughing up blood,  [ ]  Asthma,  [ ]   Wheezing Musculoskeletal:  [ ]  Arthritis, [ ]  Low back pain,  [ ]  Joint pain Hematologic:[X ] Easy Bruising, [ ]  Anemia; [ ]  Hepatitis Gastrointestinal: [ ]  Blood in stool,  [ ]  Gastroesophageal Reflux, [ ]  Trouble swallowing Urinary: [ ]  chronic Kidney disease, [ ]  on HD, [ ]  Burning with urination, [ ]  Frequent urination, [ ]  Difficulty urinating;  Skin: [ ]  Rashes, [ ]  Wounds     Physical Examination  Filed Vitals:   11/07/12 1236  BP: 139/77  Pulse: 79  Resp: 16   Filed Weights   11/07/12 1236  Weight: 215 lb (97.523 kg)   Body mass index is 36.89 kg/(m^2).   General: A&O x 3, WDWN,  Gait: normal Eyes: PERRLA, Pulmonary: CTAB, without wheezes , rales or rhonchi Cardiac: regular Rythm , with murmur          Carotid Bruits Left Right   Negative, cardiac murmur transmitted Negative, cardiac murmur transmited        Radial pulses palpable bilaterally, 2+. Aorta is not palpable.                     VASCULAR EXAM: Extremities without ischemic changes  without Gangrene; without open wounds.                                                                                                          LE Pulses LEFT RIGHT       FEMORAL   palpable   palpable        POPLITEAL  not palpable   not palpable       POSTERIOR TIBIAL  not palpable   not palpable        DORSALIS PEDIS      ANTERIOR TIBIAL not palpable  not palpable    Abdomen: soft, NT, no masses Skin: no rashes, ulcers noted Musculoskeletal: no muscle wasting or atrophy  Neurologic: A&O X 3; Appropriate Affect ; SENSATION: normal; MOTOR FUNCTION:  moving all extremities equally. Speech  is fluent/normal    Non-Invasive Vascular Imaging: DATE: 11/07/2012 ABI: RIGHT 1.14, Waveforms: monophasic;  LEFT 1.01, Waveforms: biphasic and triphasic, the bilateral ABI does not correlate with the tibial waveforms, indicating the presence of medial calcification. DUPLEX SCAN OF BYPASS: Velocities: Right: 230 at SFA origin  (triphasic), 306 at ATA prox anast. (triphasic) Left: 214 at prox. anast.  ASSESSMENT: Paula [name] is a 67 y.o. female who presents for scheduled surveillance s/p right femoral to anterior tibial bypass graft as well as a left femoral to below knee popliteal artery bypass graft. She denies claudication symptoms. She has no wound healing issues.  Her risk factors for PAD are uncontrolled diabetes and uncontrolled cholesterol (by patient report). Her risk factor for aneurysm is first degree relative having a ruptured AAA.   PLAN:  I discussed in depth with the patient the nature of atherosclerosis, and emphasized the importance of maximal medical management including strict control of blood pressure, blood glucose, and lipid levels, obtaining regular exercise, and continued cessation of smoking.  The patient is aware that without maximal medical management the underlying atherosclerotic disease process will progress, limiting the benefit of any interventions. Based on the patient's vascular studies and examination, pt will return to clinic in 6  months for ABI's and bilateral LE Duplex, and aorto-iliac Duplex since father died of ruptured AAA.  The patient was given information about PAD including signs, symptoms, treatment, what symptoms should prompt the patient to seek immediate medical care, and risk reduction measures to take.  Charisse March, RN, MSN, FNP-C Office Phone: [phone]  Clinic MD: Margurite Auerbach  11/07/2012 12:56 PM

## 2012-11-08 NOTE — Addendum Note (Signed)
Addended by: Sharee Pimple on: 11/08/2012 11:21 AM   Modules accepted: Orders

## 2012-12-05 ENCOUNTER — Telehealth (HOSPITAL_COMMUNITY): Payer: Self-pay | Admitting: *Deleted

## 2013-03-23 ENCOUNTER — Other Ambulatory Visit: Payer: Self-pay | Admitting: Family

## 2013-03-23 DIAGNOSIS — Z8249 Family history of ischemic heart disease and other diseases of the circulatory system: Secondary | ICD-10-CM

## 2013-04-23 ENCOUNTER — Encounter (HOSPITAL_COMMUNITY): Payer: Self-pay | Admitting: *Deleted

## 2013-04-23 ENCOUNTER — Inpatient Hospital Stay (HOSPITAL_COMMUNITY)
Admission: AD | Admit: 2013-04-23 | Discharge: 2013-04-25 | DRG: 392 | Disposition: A | Discharge status: Home / Self Care | Payer: 59 | Source: Ambulatory Visit | Attending: Internal Medicine | Admitting: Internal Medicine

## 2013-04-23 ENCOUNTER — Inpatient Hospital Stay (HOSPITAL_COMMUNITY): Payer: 59

## 2013-04-23 DIAGNOSIS — Z7982 Long term (current) use of aspirin: Secondary | ICD-10-CM

## 2013-04-23 DIAGNOSIS — R627 Adult failure to thrive: Secondary | ICD-10-CM | POA: Diagnosis present

## 2013-04-23 DIAGNOSIS — Z823 Family history of stroke: Secondary | ICD-10-CM

## 2013-04-23 DIAGNOSIS — N179 Acute kidney failure, unspecified: Secondary | ICD-10-CM | POA: Diagnosis present

## 2013-04-23 DIAGNOSIS — K219 Gastro-esophageal reflux disease without esophagitis: Secondary | ICD-10-CM | POA: Diagnosis present

## 2013-04-23 DIAGNOSIS — R112 Nausea with vomiting, unspecified: Secondary | ICD-10-CM

## 2013-04-23 DIAGNOSIS — I4949 Other premature depolarization: Secondary | ICD-10-CM | POA: Diagnosis not present

## 2013-04-23 DIAGNOSIS — E669 Obesity, unspecified: Secondary | ICD-10-CM | POA: Diagnosis present

## 2013-04-23 DIAGNOSIS — Z48812 Encounter for surgical aftercare following surgery on the circulatory system: Secondary | ICD-10-CM

## 2013-04-23 DIAGNOSIS — I70219 Atherosclerosis of native arteries of extremities with intermittent claudication, unspecified extremity: Secondary | ICD-10-CM | POA: Diagnosis present

## 2013-04-23 DIAGNOSIS — E860 Dehydration: Secondary | ICD-10-CM

## 2013-04-23 DIAGNOSIS — Z6834 Body mass index (BMI) 34.0-34.9, adult: Secondary | ICD-10-CM

## 2013-04-23 DIAGNOSIS — I798 Other disorders of arteries, arterioles and capillaries in diseases classified elsewhere: Secondary | ICD-10-CM | POA: Diagnosis present

## 2013-04-23 DIAGNOSIS — R197 Diarrhea, unspecified: Secondary | ICD-10-CM

## 2013-04-23 DIAGNOSIS — I739 Peripheral vascular disease, unspecified: Secondary | ICD-10-CM | POA: Diagnosis present

## 2013-04-23 DIAGNOSIS — Z8249 Family history of ischemic heart disease and other diseases of the circulatory system: Secondary | ICD-10-CM

## 2013-04-23 DIAGNOSIS — A088 Other specified intestinal infections: Principal | ICD-10-CM | POA: Diagnosis present

## 2013-04-23 DIAGNOSIS — I959 Hypotension, unspecified: Secondary | ICD-10-CM | POA: Diagnosis present

## 2013-04-23 DIAGNOSIS — E785 Hyperlipidemia, unspecified: Secondary | ICD-10-CM | POA: Diagnosis present

## 2013-04-23 DIAGNOSIS — Z7902 Long term (current) use of antithrombotics/antiplatelets: Secondary | ICD-10-CM

## 2013-04-23 DIAGNOSIS — Z9851 Tubal ligation status: Secondary | ICD-10-CM

## 2013-04-23 DIAGNOSIS — I10 Essential (primary) hypertension: Secondary | ICD-10-CM | POA: Diagnosis present

## 2013-04-23 DIAGNOSIS — E1159 Type 2 diabetes mellitus with other circulatory complications: Secondary | ICD-10-CM

## 2013-04-23 DIAGNOSIS — Z79899 Other long term (current) drug therapy: Secondary | ICD-10-CM

## 2013-04-23 LAB — GLUCOSE, CAPILLARY
GLUCOSE-CAPILLARY: 153 mg/dL — AB (ref 70–99)
Glucose-Capillary: 133 mg/dL — ABNORMAL HIGH (ref 70–99)

## 2013-04-23 MED ORDER — CLOPIDOGREL BISULFATE 75 MG PO TABS
75.0000 mg | ORAL_TABLET | Freq: Every day | ORAL | Status: DC
  Administered 2013-04-23 – 2013-04-25 (×2): 75 mg via ORAL
  Filled 2013-04-23 (×3): qty 1

## 2013-04-23 MED ORDER — ACETAMINOPHEN 650 MG RE SUPP: 650.0000 mg | Freq: Four times a day (QID) | RECTAL | Status: DC | PRN

## 2013-04-23 MED ORDER — ONDANSETRON HCL 4 MG/2ML IJ SOLN: 4.0000 mg | Freq: Four times a day (QID) | INTRAMUSCULAR | Status: DC | PRN

## 2013-04-23 MED ORDER — CLOPIDOGREL BISULFATE 75 MG PO TABS
75.0000 mg | ORAL_TABLET | Freq: Every day | ORAL | Status: DC
  Filled 2013-04-23: qty 1

## 2013-04-23 MED ORDER — INSULIN ASPART 100 UNIT/ML ~~LOC~~ SOLN
0.0000 [IU] | Freq: Three times a day (TID) | SUBCUTANEOUS | Status: DC
  Administered 2013-04-23: 2 [IU] via SUBCUTANEOUS
  Administered 2013-04-24 – 2013-04-25 (×2): 1 [IU] via SUBCUTANEOUS
  Administered 2013-04-25: 2 [IU] via SUBCUTANEOUS

## 2013-04-23 MED ORDER — HEPARIN SODIUM (PORCINE) 5000 UNIT/ML IJ SOLN
5000.0000 [IU] | Freq: Two times a day (BID) | INTRAMUSCULAR | Status: DC
  Administered 2013-04-23 – 2013-04-24 (×3): 5000 [IU] via SUBCUTANEOUS
  Filled 2013-04-23 (×5): qty 1

## 2013-04-23 MED ORDER — INSULIN ASPART 100 UNIT/ML ~~LOC~~ SOLN: 0.0000 [IU] | Freq: Three times a day (TID) | SUBCUTANEOUS | Status: DC

## 2013-04-23 MED ORDER — ONDANSETRON HCL 4 MG PO TABS: 4.0000 mg | ORAL_TABLET | Freq: Four times a day (QID) | ORAL | Status: DC | PRN

## 2013-04-23 MED ORDER — ASPIRIN EC 81 MG PO TBEC
81.0000 mg | DELAYED_RELEASE_TABLET | Freq: Every day | ORAL | Status: DC
  Administered 2013-04-23 – 2013-04-25 (×3): 81 mg via ORAL
  Filled 2013-04-23 (×3): qty 1

## 2013-04-23 MED ORDER — ACETAMINOPHEN 325 MG PO TABS: 650.0000 mg | ORAL_TABLET | Freq: Four times a day (QID) | ORAL | Status: DC | PRN

## 2013-04-23 MED ORDER — SODIUM CHLORIDE 0.9 % IV SOLN
INTRAVENOUS | Status: DC
  Administered 2013-04-23 – 2013-04-24 (×2): 100 mL/h via INTRAVENOUS
  Administered 2013-04-25: 01:00:00 via INTRAVENOUS

## 2013-04-23 NOTE — Progress Notes (Signed)
Paula [name] [id number] Admission Data: 04/23/2013 7:30 PM Attending Provider: Gwen Pounds, MD  RCV:[id number],OFBP M, MD Consults/ Treatment Team:    Paula [name] is a 68 y.o. female patient admitted from ED awake, alert  & orientated  X 3,  Full Code, VSS - Blood pressure 102/66, pulse 102, temperature 98.3 F (36.8 C), temperature source Oral, resp. rate 18, SpO2 97.00%., no c/o shortness of breath, no c/o chest pain, no distress noted. Tele # 19 placed .   IV site WDL:  Left forearm, running NS at 100 ml/hr.  Allergies:   Allergies  Allergen Reactions  . Codeine Nausea And Vomiting     Past Medical History  Diagnosis Date  . Hypertension   . Diabetes mellitus   . Hyperlipidemia   . PVD (peripheral vascular disease)   . GERD (gastroesophageal reflux disease)   . Anemia   . Varicose veins     Pt orientation to unit, room and routine.  Admission INP armband ID verified with patient/family, and in place. SR up x 2, fall risk assessment complete with Patient and family verbalizing understanding of risks associated with falls. Pt verbalizes an understanding of how to use the call bell and to call for help before getting out of bed.  Skin, clean-dry- intact without evidence of bruising, or skin tears.   No evidence of skin break down noted on exam.     Will cont to monitor and assist as needed.  Paula Reap, RN 04/23/2013 7:30 PM

## 2013-04-23 NOTE — H&P (Signed)
Paula [name] is an 68 y.o. female.   PCP:   Precious Reel, MD   Past Medical History:  Past Medical History  Diagnosis Date  . Hypertension   . Diabetes mellitus   . Hyperlipidemia   . PVD (peripheral vascular disease)   . GERD (gastroesophageal reflux disease)   . Anemia   . Varicose veins     Past Surgical History  Procedure Laterality Date  . Tonsillectomy    . Carpal tunnel release    . Tubal ligation    . Femerol popliteal Left Jan 2007  . Colon resection    . Adenoidectomy    . Tarsal tunnel release    . Femoral bypass Left 2005  . Vein patch Left May 2007    Left leg vein patch   . Anterior tibial bpg Right 2010    Fem-to- anterior Tibial      Allergies:   Allergies  Allergen Reactions  . Codeine Nausea And Vomiting      Social History:  reports that she has never smoked. She has never used smokeless tobacco. She reports that she drinks alcohol. She reports that she does not use illicit drugs.  Family History: Family History  Problem Relation Age of Onset  . Dementia Mother   . Atrial fibrillation Mother   . Hiatal hernia Mother   . Hyperlipidemia Mother   . Hypertension Mother   . Other Mother     Varicose veins  . Heart disease Father   . AAA (abdominal aortic aneurysm) Father   . Hyperlipidemia Father    Labs on Admission:  No results found for this basename: NA, K, CL, CO2, GLUCOSE, BUN, CREATININE, CALCIUM, MG, PHOS,  in the last 72 hours No results found for this basename: AST, ALT, ALKPHOS, BILITOT, PROT, ALBUMIN,  in the last 72 hours No results found for this basename: LIPASE, AMYLASE,  in the last 72 hours No results found for this basename: WBC, NEUTROABS, HGB, HCT, MCV, PLT,  in the last 72 hours No results found for this basename: CKTOTAL, CKMB, CKMBINDEX, TROPONINI,  in the last 72 hours Lab Results  Component Value Date   INR 1.1 08/07/2008   INR 1.0 05/23/2008   INR 1.2 05/13/2008     LAB RESULT POCT:  Results for  orders placed during the hospital encounter of 01/20/11  GLUCOSE, CAPILLARY      Result Value Ref Range   Glucose-Capillary 169 (*) 70 - 99 mg/dL  POCT I-STAT, CHEM 8      Result Value Ref Range   Sodium 140  135 - 145 mEq/L   Potassium 3.8  3.5 - 5.1 mEq/L   Chloride 103  96 - 112 mEq/L   BUN 25 (*) 6 - 23 mg/dL   Creatinine, Ser 0.80  0.50 - 1.10 mg/dL   Glucose, Bld 204 (*) 70 - 99 mg/dL   Calcium, Ion 1.16  1.12 - 1.32 mmol/L   TCO2 26  0 - 100 mmol/L   Hemoglobin 10.9 (*) 12.0 - 15.0 g/dL   HCT 32.0 (*) 36.0 - 46.0 %      Radiological Exams on Admission: No results found.    Orders placed during the hospital encounter of 07/15/10  . EKG     Assessment/Plan Principal Problem:   Acute renal failure (ARF) Active Problems:   Peripheral vascular disease, unspecified   Type II or unspecified type diabetes mellitus with peripheral circulatory disorders, uncontrolled(250.72)   Hypotension, unspecified  Nausea with vomiting   Diarrhea   Dehydration   Vital Signs  Entered weight:  192  lbs., Calculated Weight: 192 lbs., ( 87.09 kg) Height: 62.75 in., ( 159.38 cm) Temperature: 98.2 deg F, Temperature site: oral Pulse rate: 104 Pulse rhythm: regular Respirations: 16  Blood Pressure #1: 82 / 48 mm Hg    BMI: 34.28 BSA: 1.90 Wt Chg: -10 lbs since 03/16/2013  Vitals entered by: Prudencio Pair, CMA on April 23, 2013 3:45 PM  Pulse Oximetry  O2 Saturation: 95% %        Risk Factors:   Smoked Tobacco Use:  Never smoker Smokeless Tobacco Use:  Never Passive smoke exposure:  no Drug use:  no HIV high-risk behavior:  no Caffeine use:  2 drinks per day Alcohol use:  yes    Type:  mixed drink     Drinks per day:  social/rare    Has patient --       Felt need to cut down:  no       Been annoyed by complaints:  no       Felt guilty about re-drinking:  no       Needed eye opener in the morning:  no    Counseled to quit/cut down alcohol use:  no Exercise:   no Seatbelt use:  100 % Sun Exposure:  rarely  Previous Tobacco Use: Signed On - 03/16/2013 Smoked Tobacco Use:  Never smoker Smokeless Tobacco Use:  Never Passive smoke exposure:  no Drug use:  no HIV high-risk behavior:  no Caffeine use:  2 drinks per day  Previous Alcohol Use: Signed On - 03/16/2013 Alcohol use:  yes    Type:  mixed drink     Drinks per day:  social/rare    Has patient --       Felt need to cut down:  no       Been annoyed by complaints:  no       Felt guilty about re-drinking:  no       Needed eye opener in the morning:  no    Counseled to quit/cut down alcohol use:  no Exercise:  no Seatbelt use:  100 % Sun Exposure:  rarely  Colonoscopy History:    Date of Last Colonoscopy:  06/11/2010  Mammogram History:    Date of Last Mammogram:  03/16/2013  PAP Smear History:    Date of Last PAP Smear:  11/07/2012  History of Present Illness  History from: patient Reason for visit: See chief complaint Chief Complaint: Patient started taking cold symptoms and was treating it with Mucinex last Wednesday through Friday. Then Friday and Saturday she had watery diarrhea, Sunday the diarrhea stopped. Today she was able to eat some crackers and some Sprite which settled her stomach some but within 1 hour she vomited. She felt better after that and tried to eat a little more but vomited that up again. Later she felt like the left side of neck was drawing up as well as her right hand when she was trying to fill out a form today. Patient feels that this is probably from being dehydrated.  History of Present Illness: 68 y/o F presents with several day c/o cold sx, clear nasal drainage, NP cough with intermittent wheezing.  Started taking mucinex and made her feel nauseated and had some vomiting and diarrhea.  This has resulted in loss of 10 lbs in past 2-3 days. She has felt weak and shakey.  Feels like body is "drawing"  Concerned was dehydration since not able to keep up  with fluids.  Trying to work.   No fever, chills, colored nasal drainage, ST, some chest tightness No abd pain, blood in stool Appetite varying  DM Hgb A1C: 6.8 no increase in bs with presenting illness until slight increase last couple days due to diet and not eating as much  Allergy:Codeine Non smoker   Review of Systems  General:       Complains of anorexia, fatigue, weight loss.        Denies fevers, chills, headache.   Eyes:       Denies irritation, discharge.   Ears/Nose/Throat:       Complains of nasal congestion.        Denies earache, nosebleeds, sore throat, hoarseness.   Cardiovascular:       Denies chest pains, dyspnea on exertion.   Respiratory:       Complains of cough, dyspnea, wheezing.        Denies excessive sputum, hemoptysis.   Gastrointestinal:       Complains of nausea, vomiting, diarrhea.        Denies constipation, abdominal pain, melena, hematochezia.   Musculoskeletal:       Denies back pain.   Skin:       Denies rash, itching, dryness.   Neurologic:       Denies vertigo, dizziness.   Endocrine:       Denies polydipsia, polyphagia, polyuria.   Heme/Lymphatic:       Denies abnormal bruising, bleeding, enlarged lymph nodes.    Past History Past Medical History (reviewed - no changes required): GERD,  Hyperlipidemia, Hypertension, DM2, Insomnia, PAD/PVD Ischemic rest pain, left foot S/P left fem-pop bypass S/P left femoral PCI stent and S/P RLE Fem - Post Tib bypass. H/O Anemia,  H/O Osteoarthritis, knee and feet, Atypical squamous cells of undetermined significance (Pap October 04, 2006 and Jul 01, 2006, Dr. Edwinna Areola), Diverticular disorder with recurrent LLQ peridiverticular abscess and sigmoid colectomy (June 2010); Perforated diverticulitis (March 2010).  Dr Excell Seltzer. Osteoarthritis, knee and feet,  Rectocele, Right fibula fracture Surgical History (reviewed - no changes required): Sigmoid colectomy (June 2010) Left femoral popliteal bypass  vein graft stenosis repair (Jul 07, 2005), R PAD Fem - Distal AT Summer 2010 Abdominal aortogram and bilateral selective lower extremity arteriogram (Jun 25, 2005) Left femoral popliteal below-knee bypass graft with reverse saphenous graft (March 03, 2005) Left superficial femoral artery PTA and stent (2005) Right post-tibial tendon repair (2005) Cardiac catheterization (2004) Tubal ligation (1999) Carpal tunnel repair (Left 1988, Right 1989) Tonsillectomy and adenoidectomy (1955)  Family History (reviewed - no changes required): Father:  Deceased at age 48 due to ruptured AAA; history of CVA. Mother:  Living; history includes hypertension, osteoarthritis, and congestive heart failure. Brother:  Living; health history unremarkable. Social History (reviewed - no changes required): Patient is married and has one son.  She has completed two years of college education and works as a Public relations account executive at [employer].  Patient denies tobacco use, alcohol consumption, or illicit drug use.  Family History Summary:     Reviewed history Last on 03/16/2013 and no changes required:04/23/2013 Father Ailene Ravel.) - Has Family History of Stroke/CVA - Entered On: 11/07/2012  General Comments - FH: Father:  Deceased at age 43 due to ruptured AAA; history of CVA. Mother:  Living; history includes hypertension, osteoarthritis, and congestive heart failure. Brother:  Living; health history unremarkable.  Social  History:    Reviewed history from 12/02/2008 and no changes required:       Patient is married and has one son.  She has completed two years of college education and works as a Public relations account executive at [employer].  Patient denies tobacco use, alcohol consumption, or illicit drug use.   Physical Exam  General appearance: well nourished, well hydrated, no acute distress  Eyes  External: conjunctivae and lids normal  Ears, Nose and Throat  External ears: normal, no lesions or deformities External  nose: normal, no lesions or deformities Otoscopic: canals clear, tympanic membranes intact, no fluid Hearing: grossly intact Nasal: mucosa, septum, and turbinates normal Pharynx: tongue normal, protrudes mid line,  posterior pharynx without erythema or exudate  Neck  Neck: supple, no masses, trachea midline  Respiratory  Respiratory effort: no intercostal retractions or use of accessory muscles Auscultation: bil scattered rhonchi with few inp/exp wheezes, min cough, no tripoding  Cardiovascular  Auscultation: S1, S2, + 2/6 murmur, No rub or gallop, tachycardic due to dehydration Periph. circulation: no cyanosis, clubbing, trace bil edema  Gastrointestinal  Abdomen: soft, non-tender, no masses, bowel sounds normal Liver and spleen: no enlargement or nodularity  Lymphatic  Neck: no cervical adenopathy  Musculoskeletal  Gait and station: normal  Skin  Inspection: no rashes, lesions  Neurologic  Sensation: intact to touch, vibration Speech: Normal  Mental Status Exam  Judgment, insight: intact Orientation: oriented to time, place, and person Memory: intact   Impression & Recommendations:  Problem # 1:  Nausea and vomiting (ICD-787.01) (QHU76-L46.5) Assessment: New Will get KUB/AAS May need RUQ [place]. IVF Follow labs  Orders: BMET (SMA 7) ([id number]) CBC/Platelets (LEX-51700) Venipuncture (FVC-94496)   Problem # 2:  Dehydration (ICD-276.51) (ICD10-E86.0) Assessment: New Acute dehdration with hypotension and creat elevation 4.2, BUN 36 N/VD She looks better than expected. admission for rehydration and evalution for treatment  Problem # 3:  Acute renal failure (ICD-584.9) (ICD10-N17.9) Assessment: New Due to acute dehydration should resolve with IV rehydraiton, oral fluids and montioring I gave her a solo cup of water.  She drank @ 1/2 and kept it down. Will admit pt for treatment, pt is agreeable tele.  5W  Problem # 4:  Hypotension (ICD-458.9)  (ICD10-I95.9) Assessment: New due to dehydration holding bp meds pending IV fluids and further orders  Problem # 5:  Diarrhea (PRF-163.84) Assessment: New Results from viral vs OTC colds meds 10 lbs wt loss WBC 5 No recent abx severe dehydration with hypotension and acute renal changes  No evidence of C Dif.  Problem # 6:  Wheezing (ICD-786.07) (YKZ99-J57.0) Assessment: New  Orders: Pulse Ox ([id number]) 95% CXR on admission and treat as indicated  Orders: Pulse Ox (ESP-23300)   Problem # 7:  DIABETES MELLITUS, TYPE II, CONTROLLED, W/VASCULAR COMPS (ICD-250.70) (ICD10-E11.51) Assessment: Unchanged  Her updated medication list for this problem includes:    Aspirin 325 Mg Tabs (Aspirin) .Marland Kitchen... Take one tablet by mouth once a day    Benicar Hct 20-12.5 Mg Tabs (Olmesartan medoxomil-hctz) .Marland Kitchen... Take one tablet by mouth once a day    Metformin Hcl 500 Mg Tabs (Metformin hcl) .Marland Kitchen... Take 2  tablet by mouth twice a day Hgb A1C: 6.8 Hold metformin c the ARF.     Problem # 8:  HYPERTENSION (ICD-401.9) (ICD10-I10) Assessment: Unchanged  Her updated medication list for this problem includes:    Benicar Hct 20-12.5 Mg Tabs (Olmesartan medoxomil-hctz) .Marland Kitchen... Take one tablet by mouth once a day  Lopressor 50 Mg Tabs (Metoprolol tartrate) .Marland Kitchen... Take 1 tablet by mouth twice a day will need to adjust medications due to hypotension with admission and fluids  BP today: 82/48. Hold BP meds c illness. Prior BP: 120/58 (03/16/2013)  Labs Reviewed: Creat: 0.9 (03/16/2013) Chol: 248 (03/16/2013)   LDL: 133 (03/16/2013)      Problem # 9:  Cardiac murmur (ICD-785.2) (ICD10-R01.1)  She has not had the Cardiac ECHO done yet.  No SOB/DOE.  Will do in house.   Problem # 10:  Iron deficiency (ICD-280.9) (ICD10-E61.1)  Her updated medication list for this problem includes:    Cvs Iron 325 (65 Fe) Mg Oral Tabs (Ferrous sulfate) ..... One a day  Hgb: 10.9 (12/07/2012)    Iron: 100  (12/08/2012)   % Sat: 23 (12/08/2012)  She is on the iron 1 per day range 10 to 10.9 - great. Hemosure was (-)  Hgb: 11.7 (03/16/2013)   Hct: 35.1 (03/16/2013)   RBC: 4.4 M/UL (03/16/2013)   MCV: 80.5 (03/16/2013)   MCH: 26.8 (03/16/2013)   MCHC: 33.3 G/DL (03/16/2013) Ferritin: 10.0 (11/03/2012) Iron: 94 (03/17/2013)   % Sat: 21 (03/17/2013) TSH: 2.05 (10/31/2012)   Problem # 11:  PERIPHERAL VASCULAR DISEASE (ICD-443.9) (ICD10-I73.9)  Her updated medication list for this problem includes:    Plavix 75 Mg Tabs (Clopidogrel bisulfate) .Marland Kitchen... Take one tablet by mouth once a day  PAD is stable - no walking pains.   She denies claudication symptoms. She has no wound issues.   ASA and Plavix. Dr Trula Slade  She is S/P R femoral to anterior tibial bypass graft as well as a L femoral to below knee popliteal artery bypass graft by Dr. Amedeo Plenty. These were both done for limb salvage. S/P arteriogram per Dr Trula Slade and was unable to detect any hemodynamically significant lesions. No intervention was performed.  ABI/Dopplers 05/2011: Duplex showed a ABI of 1.0 on the right and 0.84 on the left. No significant velocity elevations are identified on the right. On the left the bypass graft is widely patent however there is a velocity elevation within the native posterior tibial artery of 125 cm/s.   Walking and weight loss recommended. She needs to do better with RF's and better with DM2.  11/07/12 ABI: RIGHT 1.14, Waveforms: monophasic;  LEFT 1.01, Waveforms: biphasic and triphasic, the bilateral ABI does not correlate with the tibial waveforms, indicating the presence of medial calcification. DUPLEX SCAN OF BYPASS: Velocities: Right: 230 at SFA origin (triphasic), 306 at ATA prox anast. (triphasic) Left: 214 at prox. anast.  Will see Dr Trula Slade in March - for ABI's and bilateral LE Duplex, and aorto-iliac Duplex since father died of ruptured AAA  #12 - Lipids - will d/c on Vytorin.She is not tolerating  the Lipitor.  Complete Medication List: 1)  Atorvastatin Calcium 80 Mg Oral Tabs (Atorvastatin calcium) .Marland Kitchen.. 1 po qd 2)  Cvs Iron 325 (65 Fe) Mg Oral Tabs (Ferrous sulfate) .... One a day 3)  Onetouch Ultra Blue Strp (Glucose blood) .... Use 1 strip bid 4)  Onetouch Delica Lancets Fine Misc (Lancets) .... Use 1 bid 5)  Aspirin 325 Mg Tabs (Aspirin) .... Take one tablet by mouth once a day 6)  Benicar Hct 20-12.5 Mg Tabs (Olmesartan medoxomil-hctz) .... Take one tablet by mouth once a day 7)  Metformin Hcl 500 Mg Tabs (Metformin hcl) .... Take 2  tablet by mouth twice a day 8)  Lopressor 50 Mg Tabs (Metoprolol tartrate) .... Take 1 tablet  by mouth twice a day 9)  Plavix 75 Mg Tabs (Clopidogrel bisulfate) .... Take one tablet by mouth once a day 10)  Stool Softener 100 Mg Caps (Docusate sodium) .... Take one capsule by mouth once a day as needed 11)  Triamcinolone Acetonide 0.1 % Oint (Triamcinolone acetonide) .... Apply small amount as needed to arm for rash 12)  Ketoconazole 2 % Crea (Ketoconazole) .... Apply as directed to under breast yeast     GLUCOSE              [H]  181 mg/dl                   60-110   BUN                  [H]  36 mg/dl                    5-23   CREATININE           [HH] 4.2 mg/dl                   0.3-1.5     RES=RESULT VERIFIED AND REPORTED TO PHYSICIAN  eGFR Non-African American                             10.6  eGFR African American                             12.9   SODIUM                    136 mEq/L                   135-148   POTASSIUM                 4.3 mEq/L                   3.5-5.3   CHLORIDE                  103 mEq/L                   80-111   CO2                       20 mEq/L                    15-35   CALCIUM                   9.5 mg/dL                   7.0-10.5  Tests: (2) CBC (2000)   WBC                       5.60 K/uL                   4.10-10.90   LYM                       1.5 K/uL                    0.6-4.1 ! MID  0.5 K/uL                    0.0-1.8   GRAN                      3.6 K/uL                    2.0-7.8   LYM%                      27.3 %                      10.0-58.5 ! MID%                      8.1 %                       0.1-24.0   GRAN%                     64.6                        37.0-92.0   RBC                       4.8 M/uL                    4.2-6.3   HGB                       12.8 g/dL                   12.0-18.0   HCT                       39.0 %                      37.0-51.0   MCV                       81.2 fL                     80.0-97.0   MCH                       26.7 pg                     26.0-32.0   MCHC                      32.8 g/dL                   31.0-36.0   PLT                       193 K/uL                    140-440  Electronically signed by Precious Reel MD on 04/23/2013 at 5:57 PM

## 2013-04-24 DIAGNOSIS — I359 Nonrheumatic aortic valve disorder, unspecified: Secondary | ICD-10-CM

## 2013-04-24 LAB — COMPREHENSIVE METABOLIC PANEL
ALT: 20 U/L (ref 0–35)
AST: 43 U/L — AB (ref 0–37)
Albumin: 3.6 g/dL (ref 3.5–5.2)
Alkaline Phosphatase: 79 U/L (ref 39–117)
BUN: 42 mg/dL — ABNORMAL HIGH (ref 6–23)
CO2: 13 meq/L — AB (ref 19–32)
CREATININE: 4.04 mg/dL — AB (ref 0.50–1.10)
Calcium: 8.4 mg/dL (ref 8.4–10.5)
Chloride: 99 mEq/L (ref 96–112)
GFR calc Af Amer: 12 mL/min — ABNORMAL LOW (ref >90)
GFR, EST NON AFRICAN AMERICAN: 11 mL/min — AB (ref >90)
Glucose, Bld: 132 mg/dL — ABNORMAL HIGH (ref 70–99)
Potassium: 3.6 mEq/L — ABNORMAL LOW (ref 3.7–5.3)
Sodium: 135 mEq/L — ABNORMAL LOW (ref 137–147)
Total Bilirubin: 0.5 mg/dL (ref 0.3–1.2)
Total Protein: 6.8 g/dL (ref 6.0–8.3)

## 2013-04-24 LAB — CBC
HEMATOCRIT: 37 % (ref 36.0–46.0)
HEMOGLOBIN: 11.9 g/dL — AB (ref 12.0–15.0)
MCH: 26.2 pg (ref 26.0–34.0)
MCHC: 32.2 g/dL (ref 30.0–36.0)
MCV: 81.5 fL (ref 78.0–100.0)
Platelets: 152 10*3/uL (ref 150–400)
RBC: 4.54 MIL/uL (ref 3.87–5.11)
RDW: 15.1 % (ref 11.5–15.5)
WBC: 4.1 10*3/uL (ref 4.0–10.5)

## 2013-04-24 LAB — GLUCOSE, CAPILLARY
GLUCOSE-CAPILLARY: 142 mg/dL — AB (ref 70–99)
GLUCOSE-CAPILLARY: 114 mg/dL — AB (ref 70–99)
Glucose-Capillary: 106 mg/dL — ABNORMAL HIGH (ref 70–99)
Glucose-Capillary: 106 mg/dL — ABNORMAL HIGH (ref 70–99)

## 2013-04-24 LAB — CLOSTRIDIUM DIFFICILE BY PCR: Toxigenic C. Difficile by PCR: NEGATIVE

## 2013-04-24 MED ORDER — PANTOPRAZOLE SODIUM 40 MG PO TBEC
40.0000 mg | DELAYED_RELEASE_TABLET | Freq: Every day | ORAL | Status: DC
  Administered 2013-04-25: 40 mg via ORAL
  Filled 2013-04-24: qty 1

## 2013-04-24 MED ORDER — ALUM & MAG HYDROXIDE-SIMETH 200-200-20 MG/5ML PO SUSP: 30.0000 mL | Freq: Four times a day (QID) | ORAL | Status: DC | PRN

## 2013-04-24 NOTE — Progress Notes (Signed)
Called Dr. Ferd Hibbs office to notify of 3 beat run of SVT. Checked on pt, no signs of distress at this time. No complaints of pain/discomfort. Will continue to monitor.

## 2013-04-24 NOTE — Progress Notes (Signed)
Came to visit patient at bedside on behalf of [employer] program for [employer] employees/dependents with [employer]. States she may be interested in program for DM management. Agreeable to post hospital discharge call. Will follow up with patient at discharge. Consents obtained. Left packet at bedside. Will make inpatient RNCM aware.  Raiford Noble, MSN- RN,BSN- [facility] [phone]

## 2013-04-24 NOTE — Progress Notes (Signed)
Echocardiogram 2D Echocardiogram has been performed.  Paula [name] 04/24/2013, 1:54 PM

## 2013-04-24 NOTE — Progress Notes (Signed)
Subjective: Admitted yesterday c several days of a Viral Gastroenteritis leading to severe DeH, AFTT, WT loss and ARF c Cr > 4. AAS was (-). Rest of labs (-) Rxed c IVF No current V/D Mild Nausea. Feels a little better post IVF. Steady on feet.  Objective: Vital signs in last 24 hours: Temp:  [97.9 F (36.6 C)-98.3 F (36.8 C)] 97.9 F (36.6 C) (03/10 0550) Pulse Rate:  [77-102] 88 (03/10 0555) Resp:  [18] 18 (03/10 0555) BP: (90-102)/(60-66) 90/61 mmHg (03/10 0555) SpO2:  [97 %] 97 % (03/10 0555) Weight:  [89 kg (196 lb 3.4 oz)] 89 kg (196 lb 3.4 oz) (03/10 0550) Weight change:  Last BM Date: 04/22/13  CBG (last 3)   Recent Labs  04/23/13 1831 04/23/13 2202  GLUCAP 153* 133*    Intake/Output from previous day: No intake or output data in the 24 hours ending 04/24/13 0612     Physical Exam  General appearance: A and O Eyes: no scleral icterus Throat: oropharynx moist without erythema Resp: CTA Cardio: Reg and + m GI: soft, non-tender; bowel sounds normal; no masses,  no organomegaly Extremities: no clubbing, cyanosis or edema. Scars.   Lab Results: No results found for this basename: NA, K, CL, CO2, GLUCOSE, BUN, CREATININE, CALCIUM, MG, PHOS,  in the last 72 hours  No results found for this basename: AST, ALT, ALKPHOS, BILITOT, PROT, ALBUMIN,  in the last 72 hours  No results found for this basename: WBC, NEUTROABS, HGB, HCT, MCV, PLT,  in the last 72 hours  Lab Results  Component Value Date   INR 1.1 08/07/2008   INR 1.0 05/23/2008   INR 1.2 05/13/2008    No results found for this basename: CKTOTAL, CKMB, CKMBINDEX, TROPONINI,  in the last 72 hours  No results found for this basename: TSH, T4TOTAL, FREET3, T3FREE, THYROIDAB,  in the last 72 hours  No results found for this basename: VITAMINB12, FOLATE, FERRITIN, TIBC, IRON, RETICCTPCT,  in the last 72 hours  Micro Results: No results found for this or any previous visit (from the past 240  hour(s)).   Studies/Results: Acute Abdominal Series  04/23/2013   CLINICAL DATA:  Cough.  Nausea, vomiting, and diarrhea.  EXAM: ACUTE ABDOMEN SERIES (ABDOMEN 2 VIEW & CHEST 1 VIEW)  COMPARISON:  Chest x-ray dated 08/24/2012 and CT scan of the abdomen and pelvis dated 08/23/2008  FINDINGS: Heart and lungs appear normal. No free air in the abdomen. There is a small amount of air in the stomach and in the transverse portion of the colon. No dilated loops of large or small bowel. No osseous abnormality. No worrisome abdominal calcifications.  Note is made of multiple old bilateral rib fractures.  IMPRESSION: No acute abnormality of the abdomen or chest.   Electronically Signed   By: Geanie Cooley M.D.   On: 04/23/2013 22:30     Medications: Scheduled: . aspirin EC  81 mg Oral Daily  . clopidogrel  75 mg Oral Q breakfast  . heparin  5,000 Units Subcutaneous Q12H  . insulin aspart  0-9 Units Subcutaneous TID WC   Continuous: . sodium chloride 100 mL/hr (04/23/13 1800)     Assessment/Plan: Principal Problem:   Acute renal failure (ARF) Active Problems:   Peripheral vascular disease, unspecified   Type II or unspecified type diabetes mellitus with peripheral circulatory disorders, uncontrolled(250.72)   Hypotension, unspecified   Nausea with vomiting   Diarrhea   Dehydration   Viral Gastroenteritis leading to severe  DeH, AFTT, WT loss and ARF c Cr > 4. AAS was (-). Rest of labs (-) Rxed c IVF Todays Cr pending. Metformin and BP Meds on hold. May need Kidney US?  Still relatively Hypotensive - Hold BP meds - continue IVF.  DM2 - ISS.  Monitor-  Recent Labs  04/23/13 1831 04/23/13 2202  GLUCAP 153* 133*   DVT Prophylaxis - SQ Heparin  PAD - ASA/Plavix.  Will be following up c Vasc soon.  Murmur - For 2D ECHO.    LOS: 1 day   Paula [name] M 04/24/2013, 6:12 AM

## 2013-04-24 NOTE — Progress Notes (Signed)
Nutrition Brief Note  Patient identified on the Malnutrition Screening Tool (MST) Report. Pt with good appetite. She states that her weight loss is 2/2 dehydration and not true weight loss. Pt appears well-nourished.  Wt Readings from Last 15 Encounters:  04/24/13 196 lb 3.4 oz (89 kg)  11/07/12 215 lb (97.523 kg)  08/24/12 219 lb (99.338 kg)  05/24/11 222 lb (100.699 kg)  03/06/11 220 lb (99.791 kg)  01/20/11 220 lb (99.791 kg)  01/20/11 220 lb (99.791 kg)    Body mass index is 33.66 kg/(m^2). Patient meets criteria for Obese Class I based on current BMI.   Current diet order is Carbohydrate Modified Medium, patient is consuming approximately >50% of meals at this time. Labs and medications reviewed.   No nutrition interventions warranted at this time. If nutrition issues arise, please consult RD.   Jarold Motto MS, RD, LDN Inpatient Registered Dietitian Pager: [phone] After-hours pager: [phone]

## 2013-04-24 NOTE — Progress Notes (Signed)
Utilization review completed.  

## 2013-04-25 LAB — COMPREHENSIVE METABOLIC PANEL
ALBUMIN: 3.1 g/dL — AB (ref 3.5–5.2)
ALT: 20 U/L (ref 0–35)
AST: 41 U/L — AB (ref 0–37)
Alkaline Phosphatase: 68 U/L (ref 39–117)
BUN: 38 mg/dL — ABNORMAL HIGH (ref 6–23)
CALCIUM: 8 mg/dL — AB (ref 8.4–10.5)
CHLORIDE: 106 meq/L (ref 96–112)
CO2: 17 mEq/L — ABNORMAL LOW (ref 19–32)
Creatinine, Ser: 1.4 mg/dL — ABNORMAL HIGH (ref 0.50–1.10)
GFR calc Af Amer: 45 mL/min — ABNORMAL LOW (ref >90)
GFR calc non Af Amer: 38 mL/min — ABNORMAL LOW (ref >90)
Glucose, Bld: 111 mg/dL — ABNORMAL HIGH (ref 70–99)
Potassium: 3.7 mEq/L (ref 3.7–5.3)
SODIUM: 138 meq/L (ref 137–147)
Total Bilirubin: 0.4 mg/dL (ref 0.3–1.2)
Total Protein: 5.7 g/dL — ABNORMAL LOW (ref 6.0–8.3)

## 2013-04-25 LAB — CBC
HCT: 31.5 % — ABNORMAL LOW (ref 36.0–46.0)
HEMOGLOBIN: 10.4 g/dL — AB (ref 12.0–15.0)
MCH: 26.5 pg (ref 26.0–34.0)
MCHC: 33 g/dL (ref 30.0–36.0)
MCV: 80.4 fL (ref 78.0–100.0)
Platelets: 112 10*3/uL — ABNORMAL LOW (ref 150–400)
RBC: 3.92 MIL/uL (ref 3.87–5.11)
RDW: 15.2 % (ref 11.5–15.5)
WBC: 3.4 10*3/uL — AB (ref 4.0–10.5)

## 2013-04-25 LAB — GLUCOSE, CAPILLARY
GLUCOSE-CAPILLARY: 132 mg/dL — AB (ref 70–99)
Glucose-Capillary: 170 mg/dL — ABNORMAL HIGH (ref 70–99)

## 2013-04-25 MED ORDER — EZETIMIBE-SIMVASTATIN 10-40 MG PO TABS: 1.0000 | ORAL_TABLET | Freq: Every day | ORAL | Status: AC

## 2013-04-25 NOTE — Progress Notes (Signed)
Nsg Discharge Note  Admit Date:  04/23/2013 Discharge date: 04/25/2013   Paula [name] to be D/C'd Home per MD order.  AVS completed.  Copy for chart, and copy for patient signed, and dated. Patient/caregiver able to verbalize understanding.  Discharge Medication:   Medication List    STOP taking these medications       atorvastatin 40 MG tablet  Commonly known as:  LIPITOR     metFORMIN 500 MG tablet  Commonly known as:  GLUCOPHAGE     olmesartan-hydrochlorothiazide 20-12.5 MG per tablet  Commonly known as:  BENICAR HCT      TAKE these medications       aspirin EC 325 MG tablet  Take 325 mg by mouth daily.     clopidogrel 75 MG tablet  Commonly known as:  PLAVIX  Take 75 mg by mouth daily.     ezetimibe-simvastatin 10-40 MG per tablet  Commonly known as:  VYTORIN  Take 1 tablet by mouth daily.     metoprolol tartrate 25 MG tablet  Commonly known as:  LOPRESSOR  Take 25 mg by mouth 2 (two) times daily.     omeprazole 20 MG capsule  Commonly known as:  PRILOSEC  Take 20 mg by mouth daily.     traMADol 50 MG tablet  Commonly known as:  ULTRAM  Take 1 tablet (50 mg total) by mouth at bedtime as needed for pain.        Discharge Assessment: Filed Vitals:   04/25/13 0455  BP: 105/61  Pulse: 110  Temp:   Resp: 18   Skin clean, dry and intact without evidence of skin break down, no evidence of skin tears noted. IV catheter discontinued intact. Site without signs and symptoms of complications - no redness or edema noted at insertion site, patient denies c/o pain - only slight tenderness at site.  Dressing with slight pressure applied.  D/c Instructions-Education: Discharge instructions given to patient/family with verbalized understanding. D/c education completed with patient/family including follow up instructions, medication list, d/c activities limitations if indicated, with other d/c instructions as indicated by MD - patient able to verbalize  understanding, all questions fully answered. Patient instructed to return to ED, call 911, or call MD for any changes in condition.  Patient escorted via WC, and D/C home via private auto.  Kern Reap, RN 04/25/2013 1:35 PM

## 2013-04-25 NOTE — Care Management Note (Signed)
    Page 1 of 1   04/25/2013     3:00:26 PM   CARE MANAGEMENT NOTE 04/25/2013  Patient:  Paula [name], Paula [name]   Account Number:  [id number]  Date Initiated:  04/25/2013  Documentation initiated by:  Letha Cape  Subjective/Objective Assessment:   dx arf, hypotension  admit- lives with spouse.     Action/Plan:   has agreed to work with [facility] with a f/u phone call.   Anticipated DC Date:  04/25/2013   Anticipated DC Plan:  HOME/SELF CARE      DC Planning Services  CM consult      Choice offered to / List presented to:             Status of service:  Completed, signed off Medicare Important Message given?   (If response is "NO", the following Medicare IM given date fields will be blank) Date Medicare IM given:   Date Additional Medicare IM given:    Discharge Disposition:  HOME/SELF CARE  Per UR Regulation:  Reviewed for med. necessity/level of care/duration of stay  If discussed at Long Length of Stay Meetings, dates discussed:    Comments:

## 2013-04-25 NOTE — Discharge Summary (Signed)
Physician Discharge Summary  DISCHARGE SUMMARY   Patient ID: Paula [name] MR#: [id number] DOB/AGE: [birth year] 68 y.o.   Attending Physician:Kasandra Fehr M  Patient's WUJ:WJXBJ,YNWG M, MD  Consults:  none  Admit date: 04/23/2013 Discharge date: 04/25/2013  Discharge Diagnoses:  Principal Problem:   Acute renal failure (ARF) Active Problems:   Peripheral vascular disease, unspecified   Type II or unspecified type diabetes mellitus with peripheral circulatory disorders, uncontrolled(250.72)   Hypotension, unspecified   Nausea with vomiting   Diarrhea   Dehydration   Patient Active Problem List   Diagnosis Date Noted  . Acute renal failure (ARF) 04/23/2013  . Type II or unspecified type diabetes mellitus with peripheral circulatory disorders, uncontrolled(250.72) 04/23/2013  . Hypotension, unspecified 04/23/2013  . Nausea with vomiting 04/23/2013  . Diarrhea 04/23/2013  . Dehydration 04/23/2013  . Numbness or tingling-Feet 11/07/2012  . Pain in limb-Bilateral leg 11/07/2012  . Aftercare following surgery of the circulatory system, NEC 11/07/2012  . Peripheral vascular disease, unspecified 11/07/2012  . Atherosclerosis of native arteries of the extremities with intermittent claudication 05/24/2011   Past Medical History  Diagnosis Date  . Hypertension   . Diabetes mellitus   . Hyperlipidemia   . PVD (peripheral vascular disease)   . GERD (gastroesophageal reflux disease)   . Anemia   . Varicose veins     Discharged Condition: stable   Discharge Medications:   Medication List    STOP taking these medications       atorvastatin 40 MG tablet  Commonly known as:  LIPITOR     metFORMIN 500 MG tablet  Commonly known as:  GLUCOPHAGE     olmesartan-hydrochlorothiazide 20-12.5 MG per tablet  Commonly known as:  BENICAR HCT      TAKE these medications       aspirin EC 325 MG tablet  Take 325 mg by mouth daily.     clopidogrel 75 MG tablet  Commonly  known as:  PLAVIX  Take 75 mg by mouth daily.     ezetimibe-simvastatin 10-40 MG per tablet  Commonly known as:  VYTORIN  Take 1 tablet by mouth daily.     metoprolol tartrate 25 MG tablet  Commonly known as:  LOPRESSOR  Take 25 mg by mouth 2 (two) times daily.     omeprazole 20 MG capsule  Commonly known as:  PRILOSEC  Take 20 mg by mouth daily.     traMADol 50 MG tablet  Commonly known as:  ULTRAM  Take 1 tablet (50 mg total) by mouth at bedtime as needed for pain.        Hospital Procedures: Acute Abdominal Series  04/23/2013   CLINICAL DATA:  Cough.  Nausea, vomiting, and diarrhea.  EXAM: ACUTE ABDOMEN SERIES (ABDOMEN 2 VIEW & CHEST 1 VIEW)  COMPARISON:  Chest x-ray dated 08/24/2012 and CT scan of the abdomen and pelvis dated 08/23/2008  FINDINGS: Heart and lungs appear normal. No free air in the abdomen. There is a small amount of air in the stomach and in the transverse portion of the colon. No dilated loops of large or small bowel. No osseous abnormality. No worrisome abdominal calcifications.  Note is made of multiple old bilateral rib fractures.  IMPRESSION: No acute abnormality of the abdomen or chest.   Electronically Signed   By: Geanie Cooley M.D.   On: 04/23/2013 22:30    History of Present Illness:  Admitted 3/9 from my office c several days of a Viral Gastroenteritis leading to severe  DeH, AFTT, WT loss and ARF c Cr > 4.  Hospital Course:  Admitted 3/9 c several days of a Viral Gastroenteritis (N/V/D) leading to severe DeH, AFTT, WT loss and ARF c Cr > 4.  BP meds and metformin held. AAS was (-).  Rest of labs (-)  Rxed c IVF  N/V/D  Resolved. She had mild dyspepsia and this resolved as well. She had 3 PVCs in a row on Tele and she will be restarted on BB. Feels better post IVF. Urinating more frequently and has gone from concentrated to clear. Steady on feet. Doing much better. Eating and diet advanced. She is ready for D/c Cr improved to 1.4 today and she  can go home and push fluids.  Hypotensive -BP meds on hold - restart BB first  DM2 - ISS in house.  D/c off metformin.  May need PPar. Will decide at outpt.  04/23/13 1831  04/23/13 2202   GLUCAP  153*  133*    DVT Prophylaxis - SQ Heparin provided PAD - ASA/Plavix. Will be following up c Vasc soon = 3/27th  Murmur - - Left ventricle: The cavity size was normal. Wall thickness was normal. Systolic function was normal. The estimated ejection fraction was in the range of 60% to 65%. - Aortic valve: There was mild to moderate stenosis. Valve area: 1.88cm^2(VTI). Valve area: 1.61cm^2 (Vmax). - Mitral valve: Calcified annulus. - Atrial septum: No defect or patent foramen ovale was identified. Will need repeat ECHO @ 2 yrs or if Sxs.  Obesity - Lose more weight.   Diluted labs - will need repeat CBC as outpt.     Day of Discharge Exam BP 105/61  Pulse 110  Temp(Src) 97.9 F (36.6 C) (Oral)  Resp 18  Ht 5\' 4"  (1.626 m)  Wt 90.9 kg (200 lb 6.4 oz)  BMI 34.38 kg/m2  SpO2 99%  Physical Exam: General appearance: A and O  Eyes: no scleral icterus  Throat: oropharynx moist without erythema  Resp: CTA.  Much less wheeze or cough Cardio: Reg and + m  GI: soft, non-tender; bowel sounds normal; no masses, no organomegaly  Extremities: no clubbing, cyanosis or edema. Scars.      Discharge Labs:  Recent Labs  04/24/13 0615 04/25/13 0436  NA 135* 138  K 3.6* 3.7  CL 99 106  CO2 13* 17*  GLUCOSE 132* 111*  BUN 42* 38*  CREATININE 4.04* 1.40*  CALCIUM 8.4 8.0*    Recent Labs  04/24/13 0615 04/25/13 0436  AST 43* 41*  ALT 20 20  ALKPHOS 79 68  BILITOT 0.5 0.4  PROT 6.8 5.7*  ALBUMIN 3.6 3.1*    Recent Labs  04/24/13 0615 04/25/13 0436  WBC 4.1 3.4*  HGB 11.9* 10.4*  HCT 37.0 31.5*  MCV 81.5 80.4  PLT 152 112*   No results found for this basename: CKTOTAL, CKMB, CKMBINDEX, TROPONINI,  in the last 72 hours No results found for this basename: TSH,  T4TOTAL, FREET3, T3FREE, THYROIDAB,  in the last 72 hours No results found for this basename: VITAMINB12, FOLATE, FERRITIN, TIBC, IRON, RETICCTPCT,  in the last 72 hours Lab Results  Component Value Date   INR 1.1 08/07/2008   INR 1.0 05/23/2008   INR 1.2 05/13/2008       Discharge instructions:  Future Appointments Provider Department Dept Phone   05/11/2013 9:00 AM [facility] [place] [facility] [address] [phone]   Eat a light meal the night before the  exam Nothing to eat or drink for at least 8 hours before exam No gum chewing, or smoking the morning of the exam. Please take your morning medications with small sips of water, especially blood pressure medication *Very Important* Please wear 2 piece clothing   05/11/2013 9:30 AM [facility] [place] [facility] [address] [phone]   05/11/2013 10:30 AM [facility] [place] [facility] [address] [phone]   05/11/2013 11:00 AM Carma LairSuzanne L Nickel, NP [facility] [phone]     01-Home or Self Care Follow-up Information   Follow up with Gwen PoundsUSSO,Elim Economou M, MD In 1 week.   Specialty:  Internal Medicine   Contact information:   [address] [facility] [place] [address] [phone]        Disposition: home  Follow-up Appts: [facility]  Call for appointment.  Condition on Discharge: stable  Tests Needing Follow-up: Labs  Time spent in discharge (includes decision making & examination of pt): 30 min  Signed: Garin Mata M 04/25/2013, 7:19 AM

## 2013-05-10 ENCOUNTER — Encounter: Payer: Self-pay | Admitting: Family

## 2013-05-11 ENCOUNTER — Ambulatory Visit (INDEPENDENT_AMBULATORY_CARE_PROVIDER_SITE_OTHER): Payer: 59 | Admitting: Family

## 2013-05-11 ENCOUNTER — Ambulatory Visit (HOSPITAL_COMMUNITY)
Admission: RE | Admit: 2013-05-11 | Discharge: 2013-05-11 | Disposition: A | Discharge status: Home / Self Care | Payer: 59 | Source: Ambulatory Visit | Attending: Family | Admitting: Family

## 2013-05-11 ENCOUNTER — Ambulatory Visit (INDEPENDENT_AMBULATORY_CARE_PROVIDER_SITE_OTHER)
Admission: RE | Admit: 2013-05-11 | Discharge: 2013-05-11 | Disposition: A | Discharge status: Home / Self Care | Payer: 59 | Source: Ambulatory Visit | Attending: Family | Admitting: Family

## 2013-05-11 ENCOUNTER — Encounter: Payer: Self-pay | Admitting: Family

## 2013-05-11 VITALS — BP 149/82 | HR 72 | Resp 16 | Ht 64.0 in | Wt 202.0 lb

## 2013-05-11 DIAGNOSIS — Z48812 Encounter for surgical aftercare following surgery on the circulatory system: Secondary | ICD-10-CM

## 2013-05-11 DIAGNOSIS — I714 Abdominal aortic aneurysm, without rupture, unspecified: Secondary | ICD-10-CM | POA: Insufficient documentation

## 2013-05-11 DIAGNOSIS — I739 Peripheral vascular disease, unspecified: Secondary | ICD-10-CM

## 2013-05-11 DIAGNOSIS — Z8249 Family history of ischemic heart disease and other diseases of the circulatory system: Secondary | ICD-10-CM

## 2013-05-11 NOTE — Patient Instructions (Signed)
Peripheral Vascular Disease Peripheral Vascular Disease (PVD), also called Peripheral Arterial Disease (PAD), is a circulation problem caused by cholesterol (atherosclerotic plaque) deposits in the arteries. PVD commonly occurs in the lower extremities (legs) but it can occur in other areas of the body, such as your arms. The cholesterol buildup in the arteries reduces blood flow which can cause pain and other serious problems. The presence of PVD can place a person at risk for Coronary Artery Disease (CAD).  CAUSES  Causes of PVD can be many. It is usually associated with more than one risk factor such as:   High Cholesterol.  Smoking.  Diabetes.  Lack of exercise or inactivity.  High blood pressure (hypertension).  Obesity.  Family history. SYMPTOMS   When the lower extremities are affected, patients with PVD may experience:  Leg pain with exertion or physical activity. This is called INTERMITTENT CLAUDICATION. This may present as cramping or numbness with physical activity. The location of the pain is associated with the level of blockage. For example, blockage at the abdominal level (distal abdominal aorta) may result in buttock or hip pain. Lower leg arterial blockage may result in calf pain.  As PVD becomes more severe, pain can develop with less physical activity.  In people with severe PVD, leg pain may occur at rest.  Other PVD signs and symptoms:  Leg numbness or weakness.  Coldness in the affected leg or foot, especially when compared to the other leg.  A change in leg color.  Patients with significant PVD are more prone to ulcers or sores on toes, feet or legs. These may take longer to heal or may reoccur. The ulcers or sores can become infected.  If signs and symptoms of PVD are ignored, gangrene may occur. This can result in the loss of toes or loss of an entire limb.  Not all leg pain is related to PVD. Other medical conditions can cause leg pain such  as:  Blood clots (embolism) or Deep Vein Thrombosis.  Inflammation of the blood vessels (vasculitis).  Spinal stenosis. DIAGNOSIS  Diagnosis of PVD can involve several different types of tests. These can include:  Pulse Volume Recording Method (PVR). This test is simple, painless and does not involve the use of X-rays. PVR involves measuring and comparing the blood pressure in the arms and legs. An ABI (Ankle-Brachial Index) is calculated. The normal ratio of blood pressures is 1. As this number becomes smaller, it indicates more severe disease.  < 0.95  indicates significant narrowing in one or more leg vessels.  <0.8 there will usually be pain in the foot, leg or buttock with exercise.  <0.4 will usually have pain in the legs at rest.  <0.25  usually indicates limb threatening PVD.  Doppler detection of pulses in the legs. This test is painless and checks to see if you have a pulses in your legs/feet.  A dye or contrast material (a substance that highlights the blood vessels so they show up on x-ray) may be given to help your caregiver better see the arteries for the following tests. The dye is eliminated from your body by the kidney's. Your caregiver may order blood work to check your kidney function and other laboratory values before the following tests are performed:  Magnetic Resonance Angiography (MRA). An MRA is a picture study of the blood vessels and arteries. The MRA machine uses a large magnet to produce images of the blood vessels.  Computed Tomography Angiography (CTA). A CTA is a   specialized x-ray that looks at how the blood flows in your blood vessels. An IV may be inserted into your arm so contrast dye can be injected.  Angiogram. Is a procedure that uses x-rays to look at your blood vessels. This procedure is minimally invasive, meaning a small incision (cut) is made in your groin. A small tube (catheter) is then inserted into the artery of your groin. The catheter is  guided to the blood vessel or artery your caregiver wants to examine. Contrast dye is injected into the catheter. X-rays are then taken of the blood vessel or artery. After the images are obtained, the catheter is taken out. TREATMENT  Treatment of PVD involves many interventions which may include:  Lifestyle changes:  Quitting smoking.  Exercise.  Following a low fat, low cholesterol diet.  Control of diabetes.  Foot care is very important to the PVD patient. Good foot care can help prevent infection.  Medication:  Cholesterol-lowering medicine.  Blood pressure medicine.  Anti-platelet drugs.  Certain medicines may reduce symptoms of Intermittent Claudication.  Interventional/Surgical options:  Angioplasty. An Angioplasty is a procedure that inflates a balloon in the blocked artery. This opens the blocked artery to improve blood flow.  Stent Implant. A wire mesh tube (stent) is placed in the artery. The stent expands and stays in place, allowing the artery to remain open.  Peripheral Bypass Surgery. This is a surgical procedure that reroutes the blood around a blocked artery to help improve blood flow. This type of procedure may be performed if Angioplasty or stent implants are not an option. SEEK IMMEDIATE MEDICAL CARE IF:   You develop pain or numbness in your arms or legs.  Your arm or leg turns cold, becomes blue in color.  You develop redness, warmth, swelling and pain in your arms or legs. MAKE SURE YOU:   Understand these instructions.  Will watch your condition.  Will get help right away if you are not doing well or get worse. Document Released: 03/11/2004 Document Revised: 04/26/2011 Document Reviewed: 02/06/2008 [employer] Patient Information 2014 [employer].  

## 2013-05-11 NOTE — Progress Notes (Signed)
[facility] [place] HISTORY AND PHYSICAL   MRN : [id number]  History of Present Illness:   Paula [name] is a 68 y.o. female patient of Dr. Myra Gianotti. She is status post right femoral to anterior tibial bypass graft by Dr. Madilyn Fireman as well as a left femoral to below knee popliteal artery bypass graft by Dr. Madilyn Fireman. These were both done for limb salvage.  She underwent an arteriogram in Dec., 2012 which demonstrates native stenosis most likely in the tibioperoneal trunk near the anterior tibial takeoff. Because this was within the native vessel Dr. Myra Gianotti did not recommend intervention right then as the patient was without symptoms.   She returns today for AAA Duplex since father died of ruptured AAA.  She is a newly diagnosed diabetic, states her last A1C was 6.7.  She tried Lipitor, legs felt too heavy, resumed Vytorin with no side effects. She denies history of stroke or TIA.  Has had tingling in feet since vascular surgeries.  Pt. denies claudication in legs or feet.  Pt. denies rest pain;  denies night pain  denies non healing ulcers on lower extremities.   She was hospitalized on 04/23/2013 at [facility] for acute renal failure and dehydration.   Pt Diabetic: Yes  Pt smoker: non-smoker  Pt meds include:  Statin :Yes  Betablocker: No  ASA: Yes  Other anticoagulants/antiplatelets: Plavix    Current Outpatient Prescriptions  Medication Sig Dispense Refill  . aspirin EC 325 MG tablet Take 325 mg by mouth daily.        . clopidogrel (PLAVIX) 75 MG tablet Take 75 mg by mouth daily.        Marland Kitchen ezetimibe-simvastatin (VYTORIN) 10-40 MG per tablet Take 1 tablet by mouth daily.  30 tablet  11  . metFORMIN (GLUCOPHAGE) 500 MG tablet Take 500 mg by mouth 2 (two) times daily with a meal. Take 2 Tabs twice daily      . metoprolol tartrate (LOPRESSOR) 25 MG tablet Take 25 mg by mouth 2 (two) times daily.        Marland Kitchen omeprazole (PRILOSEC) 20 MG capsule Take 20 mg by mouth daily.         . traMADol (ULTRAM) 50 MG tablet Take 1 tablet (50 mg total) by mouth at bedtime as needed for pain.  12 tablet  0   No current facility-administered medications for this visit.    Past Medical History  Diagnosis Date  . Hypertension   . Diabetes mellitus   . Hyperlipidemia   . PVD (peripheral vascular disease)   . GERD (gastroesophageal reflux disease)   . Anemia   . Varicose veins     Past Surgical History  Procedure Laterality Date  . Tonsillectomy    . Carpal tunnel release    . Tubal ligation    . Femerol popliteal Left Jan 2007  . Colon resection    . Adenoidectomy    . Tarsal tunnel release    . Femoral bypass Left 2005  . Vein patch Left May 2007    Left leg vein patch   . Anterior tibial bpg Right 2010    Fem-to- anterior Tibial    Social History History  Substance Use Topics  . Smoking status: Never Smoker   . Smokeless tobacco: Never Used  . Alcohol Use: Yes     Comment: rarely    Family History Family History  Problem Relation Age of Onset  . Dementia Mother   . Atrial fibrillation Mother   .  Hiatal hernia Mother   . Hyperlipidemia Mother   . Hypertension Mother   . Other Mother     Varicose veins  . Heart disease Father   . AAA (abdominal aortic aneurysm) Father   . Hyperlipidemia Father     Heart Disease before age 1   Allergies  Allergen Reactions  . Codeine Nausea And Vomiting     REVIEW OF SYSTEMS: See HPI for pertinent positives and negatives.  Physical Examination Filed Vitals:   05/11/13 1209  BP: 149/82  Pulse: 72  Resp: 16  Height: 5\' 4"  (1.626 m)  Weight: 202 lb (91.627 kg)  SpO2: 96%   Body mass index is 34.66 kg/(m^2).  General: A&O x 3, WDWN,  Gait: normal  Eyes: PERRLA,  Pulmonary: CTAB, without wheezes , rales or rhonchi  Cardiac: regular Rythm , with murmur  Carotid Bruits  Left  Right    Negative Negative  Radial pulses palpable bilaterally, 2+.  Aorta is not palpable.  VASCULAR EXAM:   Extremities without ischemic changes  without Gangrene; without open wounds.  LE Pulses  LEFT  RIGHT   FEMORAL  palpable  palpable   POPLITEAL  not palpable  not palpable   POSTERIOR TIBIAL  not palpable  not palpable   DORSALIS PEDIS  ANTERIOR TIBIAL   palpable   palpable   Abdomen: soft, NT, no masses  Skin: no rashes, ulcers noted  Musculoskeletal: no muscle wasting or atrophy  Neurologic: A&O X 3; Appropriate Affect ; SENSATION: normal; MOTOR FUNCTION: moving all extremities equally. Speech is fluent/normal  Non-Invasive Vascular Imaging (05/11/2013):   ABDOMINAL AORTA DUPLEX EVALUATION    INDICATION: Follow up abdominal aorta aneurysm    PREVIOUS INTERVENTION(S): Family history of abdominal aortic aneurysm    DUPLEX EXAM: Aorta duplex    LOCATION DIAMETER AP (cm) DIAMETER TRANSVERSE (cm) VELOCITIES (cm/sec)  Aorta Proximal 1.5 1.3 89  Aorta Mid 1.5 1.5 72  Aorta Distal 1.6 1.6 52  Right Common Iliac Artery 0.89 .98 94  Left Common Iliac Artery 0.92 .96 72    Previous max aortic diameter:  None Date: -     ADDITIONAL FINDINGS:     IMPRESSION: Largest diameter of the abdominal aorta is 1.6 x 1.6 cm.     Compared to the previous exam:  No prior exams   LOWER EXTREMITY ARTERIAL DUPLEX EVALUATION    INDICATION: Follow up bypass grafts.    PREVIOUS INTERVENTION(S): Right superficial femoral to anterior tibial artery bypass graft 08/09/2012 br Dr Madilyn Fireman, and left femoral to popliteal bypass graft 2006. History of numerous revascularizations.    DUPLEX EXAM: Duplex of bilateral bypass grafts.    RIGHT  LEFT   Peak Systolic Velocity (cm/s) Ratio (if abnormal) Waveform  Peak Systolic Velocity (cm/s) Ratio (if abnormal) Waveform  284  B Inflow Artery 164  T  140  B Proximal Anastomosis 133  T  72  B Proximal Graft 99  B  86  B Mid Graft 57  T  38  B  Distal Graft 65  T  56  B Distal Anastomosis 80  T  158  M Outflow Artery 83  T  1.18 Today's ABI / TBI .95   Calcified Previous ABI / TBI ( 11/07/12 ) Calcified    Waveform:    M - Monophasic       B - Biphasic       T - Triphasic  If Ankle Brachial Index (ABI) or Toe Brachial Index (TBI)  performed, please see complete report     ADDITIONAL FINDINGS:     IMPRESSION: Patent bilateral bypass grafts    Compared to the previous exam:  Unable to obtain increased velocity in the right distal graft as demonstrated 11/07/12.   ASSESSMENT:  Paula [name] is a 68 y.o. female who is status post right femoral to anterior tibial bypass graft by Dr. Madilyn FiremanHayes as well as a left femoral to below knee popliteal artery bypass graft by Dr. Madilyn FiremanHayes. These were both done for limb salvage.  She underwent an arteriogram in Dec., 2012 which demonstrates native stenosis most likely in the tibioperoneal trunk near the anterior tibial takeoff. Because this was within the native vessel Dr. Myra GianottiBrabham did not recommend intervention right then as the patient was without symptoms.   Abdominal aorta is normal size without evidence of aneurysm.   Patent bilateral bypass grafts. ABI's in both legs are normal.  PLAN:   Based on today's exam and non-invasive vascular lab results, the patient will follow up in 1 year with the following tests ABI's and bilateral LE arterial Duplex. I discussed in depth with the patient the nature of atherosclerosis, and emphasized the importance of maximal medical management including strict control of blood pressure, blood glucose, and lipid levels, obtaining regular exercise, and continued cessation of smoking.  The patient is aware that without maximal medical management the underlying atherosclerotic disease process will progress, limiting the benefit of any interventions.  The patient was given information about PAD including signs, symptoms, treatment, what symptoms should prompt the patient to seek immediate medical care, and risk reduction measures to take. Thank you for allowing us to  participate in this patient's care.  Charisse MarchSuzanne Nickel, RN, MSN, FNP-C [facility] Office: [phone]  Clinic MD: Imogene BurnChen 05/11/2013 12:30 PM

## 2013-05-14 NOTE — Addendum Note (Signed)
Addended by: Sharee Pimple on: 05/14/2013 05:33 PM   Modules accepted: Orders

## 2013-07-20 ENCOUNTER — Other Ambulatory Visit: Payer: Self-pay | Admitting: Internal Medicine

## 2013-07-20 DIAGNOSIS — Z1231 Encounter for screening mammogram for malignant neoplasm of breast: Secondary | ICD-10-CM

## 2013-08-10 ENCOUNTER — Ambulatory Visit (HOSPITAL_COMMUNITY): Payer: 59

## 2013-08-24 ENCOUNTER — Encounter (HOSPITAL_COMMUNITY): Payer: Self-pay

## 2013-08-24 ENCOUNTER — Encounter (HOSPITAL_COMMUNITY)
Admission: RE | Admit: 2013-08-24 | Discharge: 2013-08-24 | Disposition: A | Discharge status: Home / Self Care | Payer: 59 | Source: Ambulatory Visit | Attending: Internal Medicine | Admitting: Internal Medicine

## 2013-08-24 ENCOUNTER — Other Ambulatory Visit (HOSPITAL_COMMUNITY): Payer: Self-pay | Admitting: Internal Medicine

## 2013-08-24 DIAGNOSIS — D649 Anemia, unspecified: Secondary | ICD-10-CM | POA: Insufficient documentation

## 2013-08-24 MED ORDER — SODIUM CHLORIDE 0.9 % IV SOLN
1020.0000 mg | Freq: Once | INTRAVENOUS | Status: AC
  Administered 2013-08-24: 1020 mg via INTRAVENOUS
  Filled 2013-08-24: qty 34

## 2013-08-24 MED ORDER — SODIUM CHLORIDE 0.9 % IV SOLN
Freq: Once | INTRAVENOUS | Status: AC
  Administered 2013-08-24: 11:00:00 via INTRAVENOUS

## 2013-08-24 NOTE — Discharge Instructions (Signed)

## 2013-09-10 ENCOUNTER — Encounter: Payer: 59 | Attending: Internal Medicine | Admitting: Dietician

## 2013-09-10 ENCOUNTER — Encounter: Payer: Self-pay | Admitting: Dietician

## 2013-09-10 VITALS — Ht 64.0 in | Wt 205.0 lb

## 2013-09-10 DIAGNOSIS — Z6835 Body mass index (BMI) 35.0-35.9, adult: Secondary | ICD-10-CM | POA: Diagnosis not present

## 2013-09-10 DIAGNOSIS — Z713 Dietary counseling and surveillance: Secondary | ICD-10-CM | POA: Diagnosis not present

## 2013-09-10 DIAGNOSIS — I739 Peripheral vascular disease, unspecified: Secondary | ICD-10-CM | POA: Insufficient documentation

## 2013-09-10 DIAGNOSIS — E669 Obesity, unspecified: Secondary | ICD-10-CM | POA: Insufficient documentation

## 2013-09-10 NOTE — Patient Instructions (Signed)
Walk 3-4 x week for 20-30 minutes. Try to eat something every 3-5 hours (can be small). For breakfast, plan to have a banana with protein or yogurt. Think about bringing snacks with protein and carbs to have on hand at work.  Try [facility] [employer] if you want a granola bar. Add some vegetables to lunch. Fill half of your plate with veggies at dinner. Choose lean protein (not fried). Drink mostly water or other beverages with no sugar. Remove the candy bowl from your desk. Don't buy cookies. Use small plate for dinner meal at home.

## 2013-09-10 NOTE — Progress Notes (Signed)
  Medical Nutrition Therapy:  Appt start time: 0800 end time:  0900.   Assessment:  Primary concerns today: Paula [name] a [employer] employee who is looking to eat healthier. States she is a "stress eater" and is not able to exercise as much as she would like since she has peripheral artery disease. Has had surgeries in 2 of her legs. Worries that she may lose her leg if one of her bypass vessels fails. Has been dealing with PAD since 2003.  Also feeling stressed at work since d/t the productivity requirements. 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 Lives with husband she does the food shopping and preparation. Has been eating out a lot since she gets tired at night. Eating out about 2 x week since she is trying to eat better. Generally does not eat breakfast and skips about 10 meals per week overall. Has a candy bowl on her desk at work.   Does not feel hungry and states she eats because it is there not because she is hungry. Tends to clean her plate.   Would like to lose 60 lbs.   Preferred Learning Style:   No preference indicated   Learning Readiness:   Ready  MEDICATIONS: see list    DIETARY INTAKE:  Usual eating pattern includes 1-2 meals and 1 snacks per day.  24-hr recall:  B ( AM): water Snk ( AM): none  L ( PM): sandwich with fruit and/chips (skips a few times per week) Snk ( PM): none usually, maybe peanut butter crackers D ( PM): meat (sometimes fried) and 2 vegetables with fruit and tea  Snk ( PM): 4 x week will popcorn or cookies (4-5) Beverages: water and 12 oz soda each day, one glass tea  Usual physical activity: walking a little   Estimated energy needs: 1600 calories 180 g carbohydrates 120 g protein 44 g fat  Progress Towards Goal(s):  In progress.   Nutritional Diagnosis:  [place]-3.3 Overweight/obesity As related to hx of meal skipping, large evening meal, and excess sugar  consumption.  As evidenced by BMI of 35.2.    Intervention:  Nutrition counseling provided.  Plan: Walk 3-4 x week for 20-30 minutes. Try to eat something every 3-5 hours (can be small). For breakfast, plan to have a banana with protein or yogurt. Think about bringing snacks with protein and carbs to have on hand at work.  Try [facility] [employer] if you want a granola bar. Add some vegetables to lunch. Fill half of your plate with veggies at dinner. Choose lean protein (not fried). Drink mostly water or other beverages with no sugar. Remove the candy bowl from your desk. Don't buy cookies. Use small plate for dinner meal at home.   Teaching Method Utilized:  Visual Auditory Hands on  Handouts given during visit include:  MyPlate Handout  15 g CHO Snacks  Barriers to learning/adherence to lifestyle change: long commute, hx of chronic disease, stress  Demonstrated degree of understanding via:  Teach Back   Monitoring/Evaluation:  Dietary intake, exercise, and body weight in 2 month(s).

## 2013-09-11 ENCOUNTER — Ambulatory Visit: Payer: 59

## 2013-11-12 ENCOUNTER — Ambulatory Visit: Payer: 59 | Admitting: Dietician

## 2013-11-22 ENCOUNTER — Encounter: Payer: 59 | Attending: Internal Medicine | Admitting: Dietician

## 2013-11-22 VITALS — Ht 64.0 in | Wt 199.3 lb

## 2013-11-22 DIAGNOSIS — Z6834 Body mass index (BMI) 34.0-34.9, adult: Secondary | ICD-10-CM | POA: Diagnosis not present

## 2013-11-22 DIAGNOSIS — Z713 Dietary counseling and surveillance: Secondary | ICD-10-CM | POA: Diagnosis not present

## 2013-11-22 DIAGNOSIS — E669 Obesity, unspecified: Secondary | ICD-10-CM | POA: Insufficient documentation

## 2013-11-22 NOTE — Patient Instructions (Signed)
Keeping walking 3 x week for 20-30 minutes, add some additional arm exercises or other cardio at the gym. Keep having yogurt in the morning. Try at least have a snack mid day if you can't do a lunch.  Try [facility] [employer] for a snack.  Add some vegetables to lunch. Try to use less fatback with vegetables. Choose lean protein (not fried). Aim to drink just diet soda instead regular.  Aim to sit at the table to eat dinner with no TV on.

## 2013-11-22 NOTE — Progress Notes (Signed)
  Medical Nutrition Therapy:  Appt start time: 0800 end time:  0830.   Assessment:  Primary concerns today: Paula [name] returns with a  6 lb weight loss. Moved the candy bowl off of her desk. Started eating yogurt in the morning. Still eating a large eating meal. Started back to walking at the gym 2-3 x week for the past 2-3 weeks. Has not been doing as much "stress eating". Not having cookies at night (not buying them).   Tries to cook meals to last 2 nights.   Preferred Learning Style:   No preference indicated   Learning Readiness:   Ready  MEDICATIONS: see list    DIETARY INTAKE:  Usual eating pattern includes 2-3 meals and 1 snack per day.  24-hr recall:  B ( AM):yogurt  Snk ( AM): none  L ( PM): yogurt, sandwich with fruit, or soup, or will have a cafeteria meal (chicken with vegetables)  (skips about 2 x per week) Snk ( PM): none usually, maybe peanut butter crackers D ( PM): meat (sometimes fried) and 2 vegetables with fruit and tea  Snk ( PM): 2 x week will popcorn  Beverages: water and 12 oz soda 2-3 x week, diet soda, one glass tea at the most  Usual physical activity: walking 20-30 minutes at the gym 2-3 x week  Estimated energy needs: 1600 calories 180 g carbohydrates 120 g protein 44 g fat  Progress Towards Goal(s):  In progress.   Nutritional Diagnosis:  Paula [name] Overweight/obesity As related to hx of meal skipping, large evening meal, and excess sugar consumption.  As evidenced by BMI of 35.2.    Intervention:  Nutrition counseling provided.  Plan: Keeping walking 3 x week for 20-30 minutes, add some additional arm exercises or other cardio at the gym. Keep having yogurt in the morning. Try at least have a snack mid day if you can't do a lunch.  Try [facility] [employer] for a snack.  Add some vegetables to lunch. Try to use less fatback with vegetables. Choose lean protein (not fried). Aim to drink just diet soda instead regular.  Aim to sit at  the table to eat dinner with no TV on.   Teaching Method Utilized:  Visual Auditory Hands on   Barriers to learning/adherence to lifestyle change: long commute, hx of chronic disease, stress  Demonstrated degree of understanding via:  Teach Back   Monitoring/Evaluation:  Dietary intake, exercise, and body weight in 2 month(s).

## 2014-01-22 ENCOUNTER — Ambulatory Visit: Payer: 59 | Admitting: Dietician

## 2014-01-23 ENCOUNTER — Encounter (HOSPITAL_COMMUNITY): Payer: Self-pay | Admitting: Surgery

## 2014-05-17 ENCOUNTER — Encounter (HOSPITAL_COMMUNITY): Payer: 59

## 2014-05-17 ENCOUNTER — Other Ambulatory Visit (HOSPITAL_COMMUNITY): Payer: 59

## 2014-05-17 ENCOUNTER — Ambulatory Visit: Payer: 59 | Admitting: Family

## 2014-05-20 ENCOUNTER — Encounter: Payer: Self-pay | Admitting: *Deleted

## 2014-06-06 ENCOUNTER — Encounter: Payer: Self-pay | Admitting: Vascular Surgery

## 2014-06-07 ENCOUNTER — Ambulatory Visit (INDEPENDENT_AMBULATORY_CARE_PROVIDER_SITE_OTHER)
Admission: RE | Admit: 2014-06-07 | Discharge: 2014-06-07 | Disposition: A | Discharge status: Home / Self Care | Payer: 59 | Source: Ambulatory Visit | Attending: Family | Admitting: Family

## 2014-06-07 ENCOUNTER — Ambulatory Visit (INDEPENDENT_AMBULATORY_CARE_PROVIDER_SITE_OTHER): Payer: 59 | Admitting: Family

## 2014-06-07 ENCOUNTER — Ambulatory Visit (HOSPITAL_COMMUNITY)
Admission: RE | Admit: 2014-06-07 | Discharge: 2014-06-07 | Disposition: A | Discharge status: Home / Self Care | Payer: 59 | Source: Ambulatory Visit | Attending: Family | Admitting: Family

## 2014-06-07 ENCOUNTER — Encounter: Payer: Self-pay | Admitting: Family

## 2014-06-07 ENCOUNTER — Other Ambulatory Visit (HOSPITAL_COMMUNITY): Payer: 59

## 2014-06-07 ENCOUNTER — Ambulatory Visit: Payer: 59 | Admitting: Family

## 2014-06-07 ENCOUNTER — Encounter (HOSPITAL_COMMUNITY): Payer: 59

## 2014-06-07 VITALS — BP 130/78 | HR 78 | Resp 16 | Ht 63.5 in | Wt 196.0 lb

## 2014-06-07 DIAGNOSIS — Z9889 Other specified postprocedural states: Secondary | ICD-10-CM | POA: Diagnosis not present

## 2014-06-07 DIAGNOSIS — I739 Peripheral vascular disease, unspecified: Secondary | ICD-10-CM

## 2014-06-07 DIAGNOSIS — Z48812 Encounter for surgical aftercare following surgery on the circulatory system: Secondary | ICD-10-CM | POA: Diagnosis not present

## 2014-06-07 DIAGNOSIS — Z95828 Presence of other vascular implants and grafts: Secondary | ICD-10-CM

## 2014-06-07 NOTE — Patient Instructions (Signed)

## 2014-06-07 NOTE — Progress Notes (Signed)
Filed Vitals:   06/07/14 1006 06/07/14 1014  BP: 143/79 130/78  Pulse: 78   Resp: 16   Height: 5' 3.5" (1.613 m)   Weight: 196 lb (88.905 kg)   SpO2: 98%

## 2014-06-07 NOTE — Progress Notes (Signed)
[facility] [place] HISTORY AND PHYSICAL -PAD  History of Present Illness Paula [name] is a 69 y.o. female patient of Dr. Myra Gianotti. She is status post right femoral to anterior tibial bypass graft in 2010 by Dr. Madilyn Fireman as well as a left femoral to below knee popliteal artery bypass graft in 2005 by Dr. Madilyn Fireman. These were both done for limb salvage.  She underwent an arteriogram in Dec., 2012 which demonstrates native stenosis most likely in the tibioperoneal trunk near the anterior tibial takeoff. Because this was within the native vessel Dr. Myra Gianotti did not recommend intervention right then as the patient was without symptoms.   She returns today for follow up.  She is a newly diagnosed diabetic, states her last A1C was 6.7.  She tried Lipitor, legs felt too heavy, resumed Vytorin with no side effects. She denies history of stroke or TIA.  Has had tingling in feet since vascular surgeries.  Pt. denies claudication in legs or feet but legs do feel tired after walking about 2 city blocks. 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 This is no worse at this visit than a year ago. Pt. denies rest pain;  denies night pain  denies non healing ulcers on lower extremities.   She was hospitalized on 04/23/2013 at [facility] for acute renal failure and dehydration.   Pt Diabetic: Yes, states last A1C was less than 7  Pt smoker: non-smoker   Pt meds include:  Statin :Yes  Betablocker: No  ASA: Yes  Other anticoagulants/antiplatelets: Plavix stopped temporarily for all teeth to be extracted on 06/11/14; she wants to know when to resume after the procedure, she stopped 5 days prior     Past Medical History  Diagnosis Date  . Hypertension   . Diabetes mellitus   . Hyperlipidemia   . PVD (peripheral vascular disease)   . GERD (gastroesophageal reflux disease)   . Anemia   . Varicose veins     Social History History  Substance Use Topics  .  Smoking status: Never Smoker   . Smokeless tobacco: Never Used  . Alcohol Use: Yes     Comment: rarely    Family History Family History  Problem Relation Age of Onset  . Dementia Mother   . Atrial fibrillation Mother   . Hiatal hernia Mother   . Hyperlipidemia Mother   . Hypertension Mother   . Other Mother     Varicose veins  . Varicose Veins Mother   . Heart disease Father     Before age 60  . AAA (abdominal aortic aneurysm) Father   . Hyperlipidemia Father     Past Surgical History  Procedure Laterality Date  . Tonsillectomy    . Carpal tunnel release    . Tubal ligation    . Femerol popliteal Left Jan 2007  . Colon resection    . Adenoidectomy    . Tarsal tunnel release    . Femoral bypass Left 2005  . Vein patch Left May 2007    Left leg vein patch   . Anterior tibial bpg Right 2010    Fem-to- anterior Tibial  . Abdominal aortagram N/A 01/20/2011    Procedure: ABDOMINAL Ronny Flurry;  Surgeon: Nada Libman, MD;  Location: [facility] CATH LAB;  Service: Cardiovascular;  Laterality: N/A;    Allergies  Allergen Reactions  . Codeine Nausea And Vomiting    Current Outpatient Prescriptions  Medication Sig Dispense Refill  .  aspirin EC 325 MG tablet Take 325 mg by mouth daily.      . clopidogrel (PLAVIX) 75 MG tablet Take 75 mg by mouth daily.      Marland Kitchen ezetimibe-simvastatin (VYTORIN) 10-40 MG per tablet Take 1 tablet by mouth daily. 30 tablet 11  . metFORMIN (GLUCOPHAGE) 500 MG tablet Take 500 mg by mouth 2 (two) times daily with a meal. Take 2 Tabs twice daily    . metoprolol tartrate (LOPRESSOR) 25 MG tablet Take 25 mg by mouth 2 (two) times daily.      Marland Kitchen olmesartan-hydrochlorothiazide (BENICAR HCT) 20-12.5 MG per tablet Take 1 tablet by mouth daily.    Marland Kitchen omeprazole (PRILOSEC) 20 MG capsule Take 20 mg by mouth daily.      . traMADol (ULTRAM) 50 MG tablet Take 1 tablet (50 mg total) by mouth at bedtime as needed for pain. 12 tablet 0   No current facility-administered  medications for this visit.    ROS: See HPI for pertinent positives and negatives.   Physical Examination  Filed Vitals:   06/07/14 1006 06/07/14 1014  BP: 143/79 130/78  Pulse: 78   Resp: 16   Height: 5' 3.5" (1.613 m)   Weight: 196 lb (88.905 kg)   SpO2: 98%    Body mass index is 34.17 kg/(m^2).  General: A&O x 3, WDWN, obese female Gait: normal  Eyes: PERRLA,  Pulmonary: CTAB, without wheezes , rales or rhonchi  Cardiac: regular Rythm , with murmur, known aortic stenosis   Carotid Bruits  Left  Right    Negative Negative  Radial pulses palpable bilaterally, 2+.  Aorta is not palpable.   VASCULAR EXAM:  Extremities without ischemic changes  without Gangrene; without open wounds.   LE Pulses  LEFT  RIGHT   FEMORAL  Not palpable  Not palpable   POPLITEAL  not palpable  not palpable   POSTERIOR TIBIAL  not palpable  not palpable   DORSALIS PEDIS  ANTERIOR TIBIAL  palpable  palpable    Abdomen: soft, NT, no palpable masses  Skin: no rashes, no ulcers  Musculoskeletal: no muscle wasting or atrophy  Neurologic: A&O X 3; Appropriate Affect ; SENSATION: normal; MOTOR FUNCTION: moving all extremities equally. Speech is fluent/normal         Non-Invasive Vascular Imaging: DATE: 06/07/2014 LOWER EXTREMITY ARTERIAL DUPLEX EVALUATION    INDICATION: Follow up bypass grafts    PREVIOUS INTERVENTION(S): Right superficial femoral to anterior tibial artery bypass graft 08/09/2012 by Dr. Madilyn Fireman, and left femoral to popliteal bypass graft 2006. History of numerous revascularizations.    DUPLEX EXAM:     RIGHT  LEFT   Peak Systolic Velocity (cm/s) Ratio (if abnormal) Waveform  Peak Systolic Velocity (cm/s) Ratio (if abnormal) Waveform  449  Brisk M Inflow Artery 157  B  122  B Proximal Anastomosis 117  B  99  B Proximal Graft 66  B  96  B Mid Graft 81  B  43  B  Distal Graft 54  B  161  B Distal Anastomosis 170  B  187 / 116  B/M  Outflow Artery 162  B  1.1 falsely elevated Today's ABI / TBI .90  1.1 Previous ABI / TBI (  05/11/2013) .95    Waveform:    M - Monophasic       B - Biphasic       T - Triphasic  If Ankle Brachial Index (ABI) or Toe Brachial Index (TBI) performed, please  see complete report  ADDITIONAL FINDINGS:     IMPRESSION: 1. Greater than 50% stenosis in the right inflow artery. 2. Patent bilateral grafts with no evidence for restenosis       ASSESSMENT: Paula [name] is a 69 y.o. female who is status post right femoral to anterior tibial bypass graft in 2010 by Dr. Madilyn Fireman as well as a left femoral to below knee popliteal artery bypass graft in 2005 by Dr. Madilyn Fireman. These were both done for limb salvage.  She underwent an arteriogram in Dec., 2012 which demonstrates native stenosis most likely in the tibioperoneal trunk near the anterior tibial takeoff. Because this was within the native vessel Dr. Myra Gianotti did not recommend intervention right then as the patient was without symptoms.   Today's bilateral LE arterial Duplex suggests greater than 50% stenosis in the right inflow artery and patent bilateral grafts with no evidence for restenosis. Right ABI is falsely elevated since the waveforms are monophasic but the ABI is normal; this is due to calcified arteries secondary to DM. The left ABI indicates mild arterial occlusive disease with mono and biphasic waveforms.   Pt's tired feeling in legs has not worsened even though her right inflow to graft has increased from 200's to 400's in a year, her job on her feet all day is not affected. She has no signs of ischemia in her feet or lower legs.   PLAN:  Based on the patient's vascular studies and examination, pt will return to clinic in 6 months with ABI's and bilateral LE arterial Duplex.   I discussed in depth with the patient the nature of atherosclerosis, and emphasized the importance of maximal medical management including strict control of  blood pressure, blood glucose, and lipid levels, obtaining regular exercise, and continued cessation of smoking.  The patient is aware that without maximal medical management the underlying atherosclerotic disease process will progress, limiting the benefit of any interventions.  The patient was given information about PAD including signs, symptoms, treatment, what symptoms should prompt the patient to seek immediate medical care, and risk reduction measures to take.  Charisse March, RN, MSN, FNP-C [facility] [place] Phone: [phone]  Clinic MD: Imogene Burn  06/07/2014 10:25 AM

## 2014-06-10 ENCOUNTER — Telehealth: Payer: Self-pay | Admitting: *Deleted

## 2014-06-10 NOTE — Telephone Encounter (Signed)
Joyce Gross,  Since Dr. Myra Gianotti is off today I asked Dr. Imogene Burn about the below pt question.  Dr. Imogene Burn thinks she can resume the Plavix immediately after the dental procedure since Plavix takes a few days to attain the antiplatelet action.  Would you mind calling the pt and letting her know?  Thank you,  Rosalita Chessman         Previous Messages     ----- Message -----   From: Annye Rusk, NP   Sent: 06/07/2014 11:18 PM    To: Nada Libman, MD, Carma Lair Nickel, NP  Subject: how many days after dental procedure to resu*   Dr. Myra Gianotti,   Plavix stopped temporarily for all teeth to be extracted on 06/11/14; she wants to know when to resume after the procedure, she stopped Plavix 5 days prior to the dental procedure on 06/06/14.   Thank you,  Rosalita Chessman          I called the patient and told her the above information per Dr. Imogene Burn regarding her use of Plavix. She voiced understanding and agreement of this plan.

## 2014-06-11 NOTE — Addendum Note (Signed)
Addended by: Sharee Pimple on: 06/11/2014 03:38 PM   Modules accepted: Orders

## 2014-11-27 ENCOUNTER — Other Ambulatory Visit: Payer: Self-pay

## 2014-11-27 ENCOUNTER — Ambulatory Visit
Admission: RE | Admit: 2014-11-27 | Discharge: 2014-11-27 | Disposition: A | Discharge status: Home / Self Care | Payer: 59 | Source: Ambulatory Visit

## 2014-11-27 DIAGNOSIS — Z1231 Encounter for screening mammogram for malignant neoplasm of breast: Secondary | ICD-10-CM

## 2014-12-11 ENCOUNTER — Encounter: Payer: Self-pay | Admitting: Family

## 2014-12-16 ENCOUNTER — Encounter: Payer: Self-pay | Admitting: Family

## 2014-12-16 ENCOUNTER — Ambulatory Visit (INDEPENDENT_AMBULATORY_CARE_PROVIDER_SITE_OTHER)
Admission: RE | Admit: 2014-12-16 | Discharge: 2014-12-16 | Disposition: A | Discharge status: Home / Self Care | Payer: 59 | Source: Ambulatory Visit | Attending: Family | Admitting: Family

## 2014-12-16 ENCOUNTER — Ambulatory Visit (INDEPENDENT_AMBULATORY_CARE_PROVIDER_SITE_OTHER): Payer: 59 | Admitting: Family

## 2014-12-16 ENCOUNTER — Ambulatory Visit (HOSPITAL_COMMUNITY)
Admission: RE | Admit: 2014-12-16 | Discharge: 2014-12-16 | Disposition: A | Discharge status: Home / Self Care | Payer: 59 | Source: Ambulatory Visit | Attending: Family | Admitting: Family

## 2014-12-16 VITALS — BP 136/72 | HR 68 | Temp 97.7°F | Resp 16 | Ht 63.5 in | Wt 191.0 lb

## 2014-12-16 DIAGNOSIS — I739 Peripheral vascular disease, unspecified: Secondary | ICD-10-CM

## 2014-12-16 DIAGNOSIS — Z95828 Presence of other vascular implants and grafts: Secondary | ICD-10-CM | POA: Diagnosis not present

## 2014-12-16 DIAGNOSIS — E119 Type 2 diabetes mellitus without complications: Secondary | ICD-10-CM | POA: Diagnosis not present

## 2014-12-16 DIAGNOSIS — E785 Hyperlipidemia, unspecified: Secondary | ICD-10-CM | POA: Insufficient documentation

## 2014-12-16 DIAGNOSIS — R0989 Other specified symptoms and signs involving the circulatory and respiratory systems: Secondary | ICD-10-CM

## 2014-12-16 DIAGNOSIS — Z48812 Encounter for surgical aftercare following surgery on the circulatory system: Secondary | ICD-10-CM | POA: Diagnosis not present

## 2014-12-16 DIAGNOSIS — I10 Essential (primary) hypertension: Secondary | ICD-10-CM | POA: Diagnosis not present

## 2014-12-16 DIAGNOSIS — Z8249 Family history of ischemic heart disease and other diseases of the circulatory system: Secondary | ICD-10-CM

## 2014-12-16 NOTE — Patient Instructions (Signed)
Peripheral Vascular Disease Peripheral vascular disease (PVD) is a disease of the blood vessels that are not part of your heart and brain. A simple term for PVD is poor circulation. In most cases, PVD narrows the blood vessels that carry blood from your heart to the rest of your body. This can result in a decreased supply of blood to your arms, legs, and internal organs, like your stomach or kidneys. However, it most often affects a person's lower legs and feet. There are two types of PVD.  Organic PVD. This is the more common type. It is caused by damage to the structure of blood vessels.  Functional PVD. This is caused by conditions that make blood vessels contract and tighten (spasm). Without treatment, PVD tends to get worse over time. PVD can also lead to acute ischemic limb. This is when an arm or limb suddenly has trouble getting enough blood. This is a medical emergency. CAUSES Each type of PVD has many different causes. The most common cause of PVD is buildup of a fatty material (plaque) inside of your arteries (atherosclerosis). Small amounts of plaque can break off from the walls of the blood vessels and become lodged in a smaller artery. This blocks blood flow and can cause acute ischemic limb. Other common causes of PVD include:  Blood clots that form inside of blood vessels.  Injuries to blood vessels.  Diseases that cause inflammation of blood vessels or cause blood vessel spasms.  Health behaviors and health history that increase your risk of developing PVD. RISK FACTORS  You may have a greater risk of PVD if you:  Have a family history of PVD.  Have certain medical conditions, including:  High cholesterol.  Diabetes.  High blood pressure (hypertension).  Coronary heart disease.  Past problems with blood clots.  Past injury, such as burns or a broken bone. These may have damaged blood vessels in your limbs.  Buerger disease. This is caused by inflamed blood  vessels in your hands and feet.  Some forms of arthritis.  Rare birth defects that affect the arteries in your legs.  Use tobacco.  Do not get enough exercise.  Are obese.  Are age 50 or older. SIGNS AND SYMPTOMS  PVD may cause many different symptoms. Your symptoms depend on what part of your body is not getting enough blood. Some common signs and symptoms include:  Cramps in your lower legs. This may be a symptom of poor leg circulation (claudication).  Pain and weakness in your legs while you are physically active that goes away when you rest (intermittent claudication).  Leg pain when at rest.  Leg numbness, tingling, or weakness.  Coldness in a leg or foot, especially when compared with the other leg.  Skin or hair changes. These can include:  Hair loss.  Shiny skin.  Pale or bluish skin.  Thick toenails.  Inability to get or maintain an erection (erectile dysfunction). People with PVD are more prone to developing ulcers and sores on their toes, feet, or legs. These may take longer than normal to heal. DIAGNOSIS Your health care provider may diagnose PVD from your signs and symptoms. The health care provider will also do a physical exam. You may have tests to find out what is causing your PVD and determine its severity. Tests may include:  Blood pressure recordings from your arms and legs and measurements of the strength of your pulses (pulse volume recordings).  Imaging studies using sound waves to take pictures of   the blood flow through your blood vessels (Doppler ultrasound).  Injecting a dye into your blood vessels before having imaging studies using:  X-rays (angiogram or arteriogram).  Computer-generated X-rays (CT angiogram).  A powerful electromagnetic field and a computer (magnetic resonance angiogram or MRA). TREATMENT Treatment for PVD depends on the cause of your condition and the severity of your symptoms. It also depends on your age. Underlying  causes need to be treated and controlled. These include long-lasting (chronic) conditions, such as diabetes, high cholesterol, and high blood pressure. You may need to first try making lifestyle changes and taking medicines. Surgery may be needed if these do not work. Lifestyle changes may include:  Quitting smoking.  Exercising regularly.  Following a low-fat, low-cholesterol diet. Medicines may include:  Blood thinners to prevent blood clots.  Medicines to improve blood flow.  Medicines to improve your blood cholesterol levels. Surgical procedures may include:  A procedure that uses an inflated balloon to open a blocked artery and improve blood flow (angioplasty).  A procedure to put in a tube (stent) to keep a blocked artery open (stent implant).  Surgery to reroute blood flow around a blocked artery (peripheral bypass surgery).  Surgery to remove dead tissue from an infected wound on the affected limb.  Amputation. This is surgical removal of the affected limb. This may be necessary in cases of acute ischemic limb that are not improved through medical or surgical treatments. HOME CARE INSTRUCTIONS  Take medicines only as directed by your health care provider.  Do not use any tobacco products, including cigarettes, chewing tobacco, or electronic cigarettes. If you need help quitting, ask your health care provider.  Lose weight if you are overweight, and maintain a healthy weight as directed by your health care provider.  Eat a diet that is low in fat and cholesterol. If you need help, ask your health care provider.  Exercise regularly. Ask your health care provider to suggest some good activities for you.  Use compression stockings or other mechanical devices as directed by your health care provider.  Take good care of your feet.  Wear comfortable shoes that fit well.  Check your feet often for any cuts or sores. SEEK MEDICAL CARE IF:  You have cramps in your legs  while walking.  You have leg pain when you are at rest.  You have coldness in a leg or foot.  Your skin changes.  You have erectile dysfunction.  You have cuts or sores on your feet that are not healing. SEEK IMMEDIATE MEDICAL CARE IF:  Your arm or leg turns cold and blue.  Your arms or legs become red, warm, swollen, painful, or numb.  You have chest pain or trouble breathing.  You suddenly have weakness in your face, arm, or leg.  You become very confused or lose the ability to speak.  You suddenly have a very bad headache or lose your vision.   This information is not intended to replace advice given to you by your health care provider. Make sure you discuss any questions you have with your health care provider.   Document Released: 03/11/2004 Document Revised: 02/22/2014 Document Reviewed: 07/12/2013 Elsevier Interactive Patient Education 2016 [employer] Inc[employer]  

## 2014-12-16 NOTE — Progress Notes (Signed)
Paula [name] HISTORY AND PHYSICAL -PAD  History of Present Illness [place] Vanliew is a 69 y.o. female patient of Dr. Myra Gianotti. She is status post right femoral to anterior tibial bypass graft in 2010 by Dr. Madilyn Fireman as well as a left femoral to below knee popliteal artery bypass graft in 2005 by Dr. Madilyn Fireman. These were both done for limb salvage.  She underwent an arteriogram in Dec., 2012 which demonstrates native stenosis most likely in the tibioperoneal trunk near the anterior tibial takeoff. Because this was within the native vessel Dr. Myra Gianotti did not recommend intervention right then as the patient was without symptoms.   She returns today for follow up.  She is a newly diagnosed diabetic, states her last A1C was 6.7.  She tried Lipitor, legs felt too heavy, resumed Vytorin with no side effects. She denies history of stroke or TIA.  Has had tingling in feet since Paula surgeries.  Pt. denies claudication in legs or feet but legs do feel tired after walking about 2 city blocks. 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 This is no worse at this visit than a year ago. Pt. denies rest pain;  denies night pain  denies non healing ulcers on lower extremities.   Pt states she knows she has a history of PVC's and also has aortic stenosis.   She was hospitalized on 04/23/2013 at [facility] for acute renal failure and dehydration.   Pt Diabetic: Yes, states last A1C was 6.1 Pt smoker: non-smoker   Pt meds include:  Statin :Yes  Betablocker: No  ASA: Yes  Other anticoagulants/antiplatelets: Plavix       Past Medical History  Diagnosis Date  . Hypertension   . Diabetes mellitus   . Hyperlipidemia   . PVD (peripheral Paula disease)   . GERD (gastroesophageal reflux disease)   . Anemia   . Varicose veins     Social History Social History  Substance Use Topics  . Smoking status: Never Smoker   . Smokeless tobacco: Never  Used  . Alcohol Use: Yes     Comment: rarely    Family History Family History  Problem Relation Age of Onset  . Dementia Mother   . Atrial fibrillation Mother   . Hiatal hernia Mother   . Hyperlipidemia Mother   . Hypertension Mother   . Other Mother     Varicose veins  . Varicose Veins Mother   . Heart disease Father     Before age 70  . AAA (abdominal aortic aneurysm) Father   . Hyperlipidemia Father     Past Surgical History  Procedure Laterality Date  . Tonsillectomy    . Carpal tunnel release    . Tubal ligation    . Femerol popliteal Left Jan 2007  . Colon resection    . Adenoidectomy    . Tarsal tunnel release    . Femoral bypass Left 2005  . Vein patch Left May 2007    Left leg vein patch   . Anterior tibial bpg Right 2010    Fem-to- anterior Tibial  . Abdominal aortagram N/A 01/20/2011    Procedure: ABDOMINAL Ronny Flurry;  Surgeon: Nada Libman, MD;  Location: [facility] CATH LAB;  Service: Cardiovascular;  Laterality: N/A;    Allergies  Allergen Reactions  . Codeine Nausea And Vomiting    Current Outpatient Prescriptions  Medication Sig Dispense Refill  . aspirin EC 325 MG tablet Take 325  mg by mouth daily.      . clopidogrel (PLAVIX) 75 MG tablet Take 75 mg by mouth daily.      Marland Kitchen ezetimibe-simvastatin (VYTORIN) 10-40 MG per tablet Take 1 tablet by mouth daily. 30 tablet 11  . metFORMIN (GLUCOPHAGE) 500 MG tablet Take 500 mg by mouth 2 (two) times daily with a meal. Take 2 Tabs twice daily    . metoprolol tartrate (LOPRESSOR) 25 MG tablet Take 25 mg by mouth 2 (two) times daily.      Marland Kitchen olmesartan-hydrochlorothiazide (BENICAR HCT) 20-12.5 MG per tablet Take 1 tablet by mouth daily.    Marland Kitchen omeprazole (PRILOSEC) 20 MG capsule Take 20 mg by mouth daily.      . traMADol (ULTRAM) 50 MG tablet Take 1 tablet (50 mg total) by mouth at bedtime as needed for pain. 12 tablet 0   No current facility-administered medications for this visit.    ROS: See HPI for pertinent  positives and negatives.   Physical Examination  Filed Vitals:   12/16/14 1314  BP: 136/72  Pulse: 68  Temp: 97.7 F (36.5 C)  TempSrc: Oral  Resp: 16  Height: 5' 3.5" (1.613 m)  Weight: 191 lb (86.637 kg)  SpO2: 98%   Body mass index is 33.3 kg/(m^2).   General: A&O x 3, WDWN, obese female Gait: normal  Eyes: PERRLA,  Pulmonary: CTAB, without wheezes , rales or rhonchi  Cardiac: regular rhythm with occasional premature contraction with accompanied pause, positive murmur, known aortic stenosis   Carotid Bruits  Left  Right    Negative positive  Radial pulses palpable bilaterally, 2+.  Aorta is not palpable.   Paula EXAM:  Extremities without ischemic changes  without Gangrene; without open wounds.   LE Pulses  LEFT  RIGHT   FEMORAL  Not palpable (obese) Not palpable (obese)  POPLITEAL  not palpable  not palpable   POSTERIOR TIBIAL  not palpable  not palpable   DORSALIS PEDIS  ANTERIOR TIBIAL  palpable  palpable    Abdomen: soft, NT, no palpable masses  Skin: no rashes, no ulcers  Musculoskeletal: no muscle wasting or atrophy  Neurologic: A&O X 3; Appropriate Affect, normal sensation; MOTOR FUNCTION: moving all extremities equally. Speech is fluent/normal                Non-Invasive Paula Imaging: DATE: 12/16/2014 LOWER EXTREMITY ARTERIAL DUPLEX EVALUATION    INDICATION: PVD    PREVIOUS INTERVENTION(S): Right superficial femoral to anterior tibial artery bypass graft 08/09/2012 by Dr. Madilyn Fireman, and left femoral to popliteal bypass graft 2006. History of numerous revascularizations    DUPLEX EXAM:     RIGHT  LEFT   Peak Systolic Velocity (cm/s) Ratio (if abnormal) Waveform  Peak Systolic Velocity (cm/s) Ratio (if abnormal) Waveform  233 1.6 B Inflow Artery 144  B  99  B Proximal Anastomosis 106  B  119  B Proximal Graft 104  B  99  B Mid Graft 87  B  44  B  Distal Graft 59  B  78  B  Distal Anastomosis 109  B  115  B Outflow Artery 213  B  1.1 Today's ABI / TBI 1.0  1.1 Previous ABI / TBI ( 06/07/14 ) .90    Waveform:    M - Monophasic       B - Biphasic       T - Triphasic  If Ankle Brachial Index (ABI) or Toe Brachial Index (TBI) performed, please see  complete report     ADDITIONAL FINDINGS:        IMPRESSION: 1. Increased velocity in the right inflow artery, with patent right femoral to anterior tibial artery bypass graft. 2. Patent left femoral to popliteal bypass graft with increased velocity in the outflow artery.    Compared to the previous exam:  Unable to obtain increased velocity in the right inflow artery as shown on prior exam.     ASSESSMENT: Paula [name] is a 69 y.o. female who is status post right femoral to anterior tibial bypass graft in 2010 by Dr. Madilyn Fireman as well as a left femoral to below knee popliteal artery bypass graft in 2005 by Dr. Madilyn Fireman. These were both done for limb salvage.  She underwent an arteriogram in Dec., 2012 which demonstrates native stenosis most likely in the tibioperoneal trunk near the anterior tibial takeoff. Because this was within the native vessel Dr. Myra Gianotti did not recommend intervention right then as the patient was without symptoms.   Today's bilateral LE arterial duplex suggests a patent right femoral to anterior tibial artery bypass graft. Patent left femoral to popliteal bypass graft with increased velocity in the outflow artery. Less velocity in the right inflow artery than prior exam. ABI's remain normal with bi and monophasic waveforms, right TBI is normal, left TBI is just below normal.  She has no signs of ischemia in her lower extremities.  She walks a great deal on her job. Her atherosclerotic risk factors remain well controlled DM, former smoking, and obesity which has improved a great deal.  Right carotid bruit; pt states mother and maternal grandfather had carotid artery disease, see Plan. She  has no history of stroke or TIA. AAA duplex in March of 2015 was normal; her father had a AAA.  Face to face time with patient was 25 minutes. Over 50% of this time was spent on counseling and coordination of care.   PLAN:  Based on the patient's Paula studies and examination, pt will return to clinic in 6 months bilateral LE arterial duplex, ABI's, and carotid duplex.   I discussed in depth with the patient the nature of atherosclerosis, and emphasized the importance of maximal medical management including strict control of blood pressure, blood glucose, and lipid levels, obtaining regular exercise, and continued cessation of smoking.  The patient is aware that without maximal medical management the underlying atherosclerotic disease process will progress, limiting the benefit of any interventions.  The patient was given information about PAD including signs, symptoms, treatment, what symptoms should prompt the patient to seek immediate medical care, and risk reduction measures to take.  Charisse March, RN, MSN, FNP-C [facility] Phone: [phone]  Clinic MD: Myra Gianotti  12/16/2014 1:16 PM

## 2015-03-10 MED FILL — METOPROLOL TARTRATE 25 MG T: 25 | 90 days supply | Qty: 180 | Fill #1

## 2015-03-10 MED FILL — metFORMIN HCL 500 MG TABS: 500 | 90 days supply | Qty: 360 | Fill #2

## 2015-04-02 MED FILL — OLMESARTAN-HCTZ 20-12.5 MG: 20-12.5 | 90 days supply | Qty: 90 | Fill #0

## 2015-04-04 DIAGNOSIS — E784 Other hyperlipidemia: Secondary | ICD-10-CM | POA: Diagnosis not present

## 2015-04-04 DIAGNOSIS — D6489 Other specified anemias: Secondary | ICD-10-CM | POA: Diagnosis not present

## 2015-04-04 DIAGNOSIS — Z9889 Other specified postprocedural states: Secondary | ICD-10-CM | POA: Diagnosis not present

## 2015-04-04 DIAGNOSIS — E1151 Type 2 diabetes mellitus with diabetic peripheral angiopathy without gangrene: Secondary | ICD-10-CM | POA: Diagnosis not present

## 2015-04-04 DIAGNOSIS — I10 Essential (primary) hypertension: Secondary | ICD-10-CM | POA: Diagnosis not present

## 2015-04-04 DIAGNOSIS — I739 Peripheral vascular disease, unspecified: Secondary | ICD-10-CM | POA: Diagnosis not present

## 2015-04-04 DIAGNOSIS — E668 Other obesity: Secondary | ICD-10-CM | POA: Diagnosis not present

## 2015-04-04 DIAGNOSIS — E781 Pure hyperglyceridemia: Secondary | ICD-10-CM | POA: Diagnosis not present

## 2015-04-04 DIAGNOSIS — Z6834 Body mass index (BMI) 34.0-34.9, adult: Secondary | ICD-10-CM | POA: Diagnosis not present

## 2015-04-04 MED FILL — KETOCONAZOLE 2% CREAM: 2 | 15 days supply | Qty: 30 | Fill #0

## 2015-04-07 ENCOUNTER — Other Ambulatory Visit (HOSPITAL_COMMUNITY): Payer: Self-pay | Admitting: Internal Medicine

## 2015-04-07 DIAGNOSIS — I350 Nonrheumatic aortic (valve) stenosis: Secondary | ICD-10-CM

## 2015-04-18 DIAGNOSIS — M545 Low back pain: Secondary | ICD-10-CM | POA: Diagnosis not present

## 2015-04-18 DIAGNOSIS — M7581 Other shoulder lesions, right shoulder: Secondary | ICD-10-CM | POA: Diagnosis not present

## 2015-04-18 DIAGNOSIS — M7061 Trochanteric bursitis, right hip: Secondary | ICD-10-CM | POA: Diagnosis not present

## 2015-04-18 MED FILL — MELOXICAM 15 MG TABLET: 15 | 30 days supply | Qty: 30 | Fill #0

## 2015-04-22 ENCOUNTER — Ambulatory Visit (HOSPITAL_COMMUNITY)
Admission: RE | Admit: 2015-04-22 | Discharge: 2015-04-22 | Disposition: A | Discharge status: Home / Self Care | Payer: 59 | Source: Ambulatory Visit | Attending: Internal Medicine | Admitting: Internal Medicine

## 2015-04-22 ENCOUNTER — Ambulatory Visit (HOSPITAL_COMMUNITY): Payer: 59

## 2015-04-22 DIAGNOSIS — E785 Hyperlipidemia, unspecified: Secondary | ICD-10-CM | POA: Diagnosis not present

## 2015-04-22 DIAGNOSIS — I119 Hypertensive heart disease without heart failure: Secondary | ICD-10-CM | POA: Insufficient documentation

## 2015-04-22 DIAGNOSIS — I059 Rheumatic mitral valve disease, unspecified: Secondary | ICD-10-CM | POA: Insufficient documentation

## 2015-04-22 DIAGNOSIS — E119 Type 2 diabetes mellitus without complications: Secondary | ICD-10-CM | POA: Diagnosis not present

## 2015-04-22 DIAGNOSIS — I350 Nonrheumatic aortic (valve) stenosis: Secondary | ICD-10-CM | POA: Insufficient documentation

## 2015-04-22 NOTE — Progress Notes (Signed)
  Echocardiogram 2D Echocardiogram has been performed.  Paula [name] 04/22/2015, 8:45 AM

## 2015-05-05 MED FILL — CLOPIDOGREL 75 MG TABLET: 75 | 90 days supply | Qty: 90 | Fill #3

## 2015-05-06 MED FILL — VYTORIN 10-40 MG TABLET: 10-40 | 90 days supply | Qty: 90 | Fill #2

## 2015-06-13 MED FILL — METOPROLOL TARTRATE 25 MG T: 25 | 90 days supply | Qty: 180 | Fill #2

## 2015-06-20 ENCOUNTER — Encounter: Payer: Self-pay | Admitting: Family

## 2015-06-27 ENCOUNTER — Ambulatory Visit (HOSPITAL_COMMUNITY): Payer: 59

## 2015-06-27 ENCOUNTER — Ambulatory Visit (HOSPITAL_COMMUNITY): Admission: RE | Admit: 2015-06-27 | Payer: 59 | Source: Ambulatory Visit

## 2015-06-27 ENCOUNTER — Ambulatory Visit: Payer: 59 | Admitting: Family

## 2015-07-07 MED FILL — OLMESARTAN-HCTZ 20-12.5 MG: 20-12.5 | 90 days supply | Qty: 90 | Fill #1

## 2015-07-21 MED FILL — metFORMIN HCL 500 MG TABS: 500 | 90 days supply | Qty: 360 | Fill #0

## 2015-07-25 DIAGNOSIS — I350 Nonrheumatic aortic (valve) stenosis: Secondary | ICD-10-CM | POA: Diagnosis not present

## 2015-07-25 DIAGNOSIS — I739 Peripheral vascular disease, unspecified: Secondary | ICD-10-CM | POA: Diagnosis not present

## 2015-07-25 DIAGNOSIS — D6489 Other specified anemias: Secondary | ICD-10-CM | POA: Diagnosis not present

## 2015-07-25 DIAGNOSIS — E611 Iron deficiency: Secondary | ICD-10-CM | POA: Diagnosis not present

## 2015-07-25 DIAGNOSIS — E1165 Type 2 diabetes mellitus with hyperglycemia: Secondary | ICD-10-CM | POA: Diagnosis not present

## 2015-07-25 DIAGNOSIS — I10 Essential (primary) hypertension: Secondary | ICD-10-CM | POA: Diagnosis not present

## 2015-07-25 DIAGNOSIS — Z95828 Presence of other vascular implants and grafts: Secondary | ICD-10-CM | POA: Diagnosis not present

## 2015-07-25 DIAGNOSIS — E1151 Type 2 diabetes mellitus with diabetic peripheral angiopathy without gangrene: Secondary | ICD-10-CM | POA: Diagnosis not present

## 2015-07-25 DIAGNOSIS — E784 Other hyperlipidemia: Secondary | ICD-10-CM | POA: Diagnosis not present

## 2015-07-28 MED FILL — EZETIMIBE-SIMVAS 10-40 MG T: 10-40 | 90 days supply | Qty: 90 | Fill #0

## 2015-07-29 ENCOUNTER — Ambulatory Visit (HOSPITAL_COMMUNITY)
Admission: RE | Admit: 2015-07-29 | Discharge: 2015-07-29 | Disposition: A | Discharge status: Home / Self Care | Payer: 59 | Source: Ambulatory Visit | Attending: Family | Admitting: Family

## 2015-07-29 DIAGNOSIS — K219 Gastro-esophageal reflux disease without esophagitis: Secondary | ICD-10-CM | POA: Insufficient documentation

## 2015-07-29 DIAGNOSIS — Z95828 Presence of other vascular implants and grafts: Secondary | ICD-10-CM | POA: Diagnosis not present

## 2015-07-29 DIAGNOSIS — I739 Peripheral vascular disease, unspecified: Secondary | ICD-10-CM | POA: Diagnosis not present

## 2015-07-29 DIAGNOSIS — I6523 Occlusion and stenosis of bilateral carotid arteries: Secondary | ICD-10-CM | POA: Diagnosis not present

## 2015-07-29 DIAGNOSIS — I10 Essential (primary) hypertension: Secondary | ICD-10-CM | POA: Insufficient documentation

## 2015-07-29 DIAGNOSIS — Z48812 Encounter for surgical aftercare following surgery on the circulatory system: Secondary | ICD-10-CM | POA: Insufficient documentation

## 2015-07-29 DIAGNOSIS — R0989 Other specified symptoms and signs involving the circulatory and respiratory systems: Secondary | ICD-10-CM | POA: Insufficient documentation

## 2015-07-29 DIAGNOSIS — E119 Type 2 diabetes mellitus without complications: Secondary | ICD-10-CM | POA: Insufficient documentation

## 2015-07-29 DIAGNOSIS — E785 Hyperlipidemia, unspecified: Secondary | ICD-10-CM | POA: Insufficient documentation

## 2015-07-29 DIAGNOSIS — Z8249 Family history of ischemic heart disease and other diseases of the circulatory system: Secondary | ICD-10-CM | POA: Diagnosis not present

## 2015-07-29 NOTE — Progress Notes (Signed)
Preliminary results by tech - Carotid Duplex Completed. Right ICA demonstrated a 40-59% stenosis - low end of scale. Left ICA demonstrated a 80-99% stenosis. Results given to Dr. Ferd Hibbs office and patient will be scheduled for a stat consults with Dr. Myra Gianotti. [place] Sink Lena Fieldhouse, BS, RDMS, RVT

## 2015-07-31 ENCOUNTER — Encounter: Payer: Self-pay | Admitting: Surgery

## 2015-08-04 ENCOUNTER — Encounter: Payer: Self-pay | Admitting: Surgery

## 2015-08-04 ENCOUNTER — Ambulatory Visit (INDEPENDENT_AMBULATORY_CARE_PROVIDER_SITE_OTHER): Payer: 59 | Admitting: Surgery

## 2015-08-04 ENCOUNTER — Other Ambulatory Visit: Payer: Self-pay

## 2015-08-04 ENCOUNTER — Encounter: Payer: 59 | Admitting: Surgery

## 2015-08-04 VITALS — BP 120/82 | HR 94 | Ht 63.5 in | Wt 191.4 lb

## 2015-08-04 DIAGNOSIS — I6522 Occlusion and stenosis of left carotid artery: Secondary | ICD-10-CM

## 2015-08-04 NOTE — Progress Notes (Signed)
[facility] [place]  Patient name: Paula [name] MRN: [id number] DOB: 1945/08/21 Sex: female  REASON FOR VISIT: carotid stenosis  HPI: The patient comes back today for followup. She is well-known to our office. She is status post right femoral to anterior tibial bypass graft by Dr. Madilyn Fireman as well as a left femoral to below knee popliteal artery bypass graft by Dr. Madilyn Fireman. These were both done for limb salvage. She recently was found to have a stenosis potentially on the left leg and underwent arteriogram in December. I was unable to detect any hemodynamically significant lesions  The patient was recently scheduled for a carotid duplex secondary to a bruit.  This identified 40-59% right carotid stenosis and greater than 80% left carotid stenosis.  The patient has a history of hemo-plegic migraines for many years.  Generally, will happen is that she gets numbness and tingling in her right arm and right leg.  She will take an aspirin and this will usually hold off the headache.  Her symptoms are in the right arm and right leg as well as left sided headaches.  Past Medical History  Diagnosis Date  . Hypertension   . Diabetes mellitus   . Hyperlipidemia   . PVD (peripheral vascular disease) (HCC)   . GERD (gastroesophageal reflux disease)   . Anemia   . Varicose veins     Family History  Problem Relation Age of Onset  . Dementia Mother   . Atrial fibrillation Mother   . Hiatal hernia Mother   . Hyperlipidemia Mother   . Hypertension Mother   . Other Mother     Varicose veins  . Varicose Veins Mother   . Heart disease Father     Before age 19  . AAA (abdominal aortic aneurysm) Father   . Hyperlipidemia Father     SOCIAL HISTORY: Social History  Substance Use Topics  . Smoking status: Never Smoker   . Smokeless tobacco: Never Used  . Alcohol Use: Yes     Comment: rarely    Allergies  Allergen Reactions  .  Codeine Nausea And Vomiting    Current Outpatient Prescriptions  Medication Sig Dispense Refill  . aspirin EC 325 MG tablet Take 325 mg by mouth daily.      . clopidogrel (PLAVIX) 75 MG tablet Take 75 mg by mouth daily.      Marland Kitchen ezetimibe-simvastatin (VYTORIN) 10-40 MG per tablet Take 1 tablet by mouth daily. 30 tablet 11  . metFORMIN (GLUCOPHAGE) 500 MG tablet Take 500 mg by mouth 2 (two) times daily with a meal. Take 2 Tabs in the morning and one at night    . metoprolol tartrate (LOPRESSOR) 25 MG tablet Take 25 mg by mouth 2 (two) times daily.      Marland Kitchen olmesartan-hydrochlorothiazide (BENICAR HCT) 20-12.5 MG per tablet Take 1 tablet by mouth daily.    Marland Kitchen omeprazole (PRILOSEC) 20 MG capsule Take 20 mg by mouth daily.      . traMADol (ULTRAM) 50 MG tablet Take 1 tablet (50 mg total) by mouth at bedtime as needed for pain. 12 tablet 0   No current facility-administered medications for this visit.    REVIEW OF SYSTEMS:  [X]  denotes positive finding, [ ]  denotes negative finding Cardiac  Comments:  Chest pain or chest pressure:    Shortness of breath upon exertion:    Short of breath when lying flat:    Irregular heart rhythm: x  Vascular    Pain in calf, thigh, or hip brought on by ambulation:    Pain in feet at night that wakes you up from your sleep:     Blood clot in your veins:    Leg swelling:  x       Pulmonary    Oxygen at home:    Productive cough:     Wheezing:         Neurologic    Sudden weakness in arms or legs:  x   Sudden numbness in arms or legs:  x   Sudden onset of difficulty speaking or slurred speech:    Temporary loss of vision in one eye:     Problems with dizziness:  x       Gastrointestinal    Blood in stool:     Vomited blood:         Genitourinary    Burning when urinating:     Blood in urine:        Psychiatric    Major depression:         Hematologic    Bleeding problems:    Problems with blood clotting too easily:        Skin      Rashes or ulcers:        Constitutional    Fever or chills:      PHYSICAL EXAM: Filed Vitals:   08/04/15 1351 08/04/15 1355  BP: 126/84 120/82  Pulse: 94   Height: 5' 3.5" (1.613 m)   Weight: 191 lb 6.4 oz (86.818 kg)   SpO2: 96%     GENERAL: The patient is a well-nourished female, in no acute distress. The vital signs are documented above. CARDIAC: There is a regular rate and rhythm.  VASCULAR: left carotid bruit PULMONARY: There is good air exchange bilaterally without wheezing or rales. ABDOMEN: Soft and non-tender with normal pitched bowel sounds.  MUSCULOSKELETAL: There are no major deformities or cyanosis. NEUROLOGIC: No focal weakness or paresthesias are detected. SKIN: There are no ulcers or rashes noted. PSYCHIATRIC: The patient has a normal affect.  DATA:  I have reviewed her carotid Doppler studies which revealed greater than 80% left carotid stenosis and 40-59% right carotid stenosis  Echocardiogram from March 2017 shows an ejection fraction of 60-65%  MEDICAL ISSUES: Symptomatic left carotid stenosis.  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  Regardless, we have decided to proceed with left carotid endarterectomy.  I discussed the risks and benefits of the operation including the risk of stroke and nerve injury as well as cardiopulmonary complications.  I will leave her on her Plavix and therefore there will be an increased risk of bleeding.  Her operation has been scheduled for July 7   Because of the interval between today and her surgery I will go ahead and get formal cardiac clearance.   Wells Xadrian Craighead, MD [facility] [place] Tel [phone] Pager [phone]  

## 2015-08-05 ENCOUNTER — Telehealth: Payer: Self-pay | Admitting: Surgery

## 2015-08-05 NOTE — Telephone Encounter (Signed)
Spoke with patient's husband. Cardiac clearance appointment with Dr. Antoine Poche 08/07/15 7:45 am [facility] [phone]. Husband verbalized understanding.

## 2015-08-06 NOTE — Progress Notes (Signed)
Cardiology Office Note   Date:  08/07/2015   ID:  Paula [name], DOB 08-Feb-1946, MRN [id number]  PCP:  Gwen Pounds, MD  Cardiologist:   Rollene Rotunda, MD   No chief complaint on file.     History of Present Illness: [place] is a 70 y.o. female who presents for preoperative cardiac clearance prior to left carotid endarterectomy.  She has no past cardiac history but she has an extensive history of peripheral vascular disease. She has had distant stress tests but none in the past 10 years.  She is somewhat limited by leg tiredness and some hip pain.  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  She does get dyspnea with some activity.  The patient denies any new symptoms such as chest discomfort, neck or arm discomfort. There has been no PND or orthopnea. There have been no reported palpitations, presyncope or syncope.  She does have a murmur.  Echo in March did demonstrate stable mild AS.     Past Medical History  Diagnosis Date  . Hypertension   . Diabetes mellitus   . Hyperlipidemia   . PVD (peripheral vascular disease) (HCC)   . GERD (gastroesophageal reflux disease)   . Anemia   . Varicose veins     Past Surgical History  Procedure Laterality Date  . Tonsillectomy    . Carpal tunnel release    . Tubal ligation    . Femerol popliteal Left Jan 2007  . Colon resection    . Adenoidectomy    . Tarsal tunnel release    . Femoral bypass Left 2005  . Vein patch Left May 2007    Left leg vein patch   . Anterior tibial bpg Right 2010    Fem-to- anterior Tibial  . Abdominal aortagram N/A 01/20/2011    Procedure: ABDOMINAL Ronny Flurry;  Surgeon: Nada Libman, MD;  Location: [facility] CATH LAB;  Service: Cardiovascular;  Laterality: N/A;     Current Outpatient Prescriptions  Medication Sig Dispense Refill  . aspirin EC 325 MG tablet Take 325 mg by mouth daily.      . clopidogrel (PLAVIX) 75 MG tablet Take 75 mg by mouth  daily.      Marland Kitchen ezetimibe-simvastatin (VYTORIN) 10-40 MG per tablet Take 1 tablet by mouth daily. 30 tablet 11  . meloxicam (MOBIC) 15 MG tablet Take 15 mg by mouth daily.    . metFORMIN (GLUCOPHAGE) 500 MG tablet Take 500 mg by mouth 2 (two) times daily with a meal. Take 2 Tabs in the morning and one at night    . metoprolol tartrate (LOPRESSOR) 25 MG tablet Take 25 mg by mouth 2 (two) times daily.      Marland Kitchen olmesartan-hydrochlorothiazide (BENICAR HCT) 20-12.5 MG per tablet Take 1 tablet by mouth daily.    Marland Kitchen omeprazole (PRILOSEC) 20 MG capsule Take 20 mg by mouth daily.      . traMADol (ULTRAM) 50 MG tablet Take 1 tablet (50 mg total) by mouth at bedtime as needed for pain. 12 tablet 0   No current facility-administered medications for this visit.    Allergies:   Codeine    Social History:  The patient  reports that she has never smoked. She has never used smokeless tobacco. She reports that she drinks alcohol. She reports that she does not use illicit drugs.   Family History:  The patient's family history includes AAA (abdominal aortic aneurysm) in her father;  Atrial fibrillation in her mother; Dementia in her mother; Heart disease in her father; Hiatal hernia in her mother; Hyperlipidemia in her father and mother; Hypertension in her mother; Other in her mother; Varicose Veins in her mother.    ROS:  Please see the history of present illness.   Otherwise, review of systems are positive for shoulder pain and hip pain.   All other systems are reviewed and negative.    PHYSICAL EXAM: VS:  BP 137/72 mmHg  Pulse 80  Ht  (1.626 m)  Wt 193 lb 3.2 oz (87.635 kg)  BMI 33.15 kg/m2 , BMI Body mass index is 33.15 kg/(m^2). GENERAL:  Well appearing HEENT:  Pupils equal round and reactive, fundi not visualized, oral mucosa unremarkable NECK:  No jugular venous distention, waveform within normal limits, carotid upstroke brisk and symmetric, no bruits, no thyromegaly LYMPHATICS:  No cervical,  inguinal adenopathy LUNGS:  Clear to auscultation bilaterally BACK:  No CVA tenderness CHEST:  Unremarkable HEART:  PMI not displaced or sustained,S1 and S2 within normal limits, no S3, no S4, no clicks, no rub, 2 out of 6 apical systolic murmur radiating slightly out the aortic outflow tract and early peaking, no diastolic  murmurs ABD:  Flat, positive bowel sounds normal in frequency in pitch, no bruits, no rebound, no guarding, no midline pulsatile mass, no hepatomegaly, no splenomegaly EXT:  2 plus pulses throughout, no edema, no cyanosis no clubbing SKIN:  No rashes no nodules NEURO:  Cranial nerves II through XII grossly intact, motor grossly intact throughout PSYCH:  Cognitively intact, oriented to person place and time    EKG:  EKG is ordered today. The ekg ordered today demonstratesSinus rhythm, rate 80, axis within normal limits, intervals within normal limits, no acute ST-T wave changes  Recent Labs: No results found for requested labs within last 365 days.    Lipid Panel No results found for: CHOL, TRIG, HDL, CHOLHDL, VLDL, LDLCALC, LDLDIRECT    Wt Readings from Last 3 Encounters:  08/07/15 193 lb 3.2 oz (87.635 kg)  08/04/15 191 lb 6.4 oz (86.818 kg)  12/16/14 191 lb (86.637 kg)      Other studies Reviewed: Additional studies/ records that were reviewed today include:   Echo and [facility] records. Review of the above records demonstrates:  Please see elsewhere in the note.     ASSESSMENT AND PLAN:   PREOP:  The patient dose have known PVD and a limited functional study.  She has significant risk factors.  Given this further preop testing is indicated per ACC/AHA guidelines given the nature of the upcoming surgery.  She will not be able to walk on a treadmill.  She will have a POET (Plain Old Exercise Treadmill)  AS:  This is very mild.  No change in therapy is planned.  Further imaging is indicated   CAROTID STENOSIS:     As above   HTN: The blood pressure is at  target. No change in medications is indicated. We will continue with therapeutic lifestyle changes (TLC).  DM:  A1C was last 6.4.  Per Dr. Timothy Lasso.     Current medicines are reviewed at length with the patient today.  The patient does not have concerns regarding medicines.  The following changes have been made:  no change  Labs/ tests ordered today include:   Orders Placed This Encounter  Procedures  . Myocardial Perfusion Imaging  . EKG 12-Lead     Disposition:   FU with me as needed.  Signed, Rollene Rotunda, MD  08/07/2015 8:10 AM    [facility]

## 2015-08-07 ENCOUNTER — Encounter: Payer: Self-pay | Admitting: Cardiology

## 2015-08-07 ENCOUNTER — Ambulatory Visit (INDEPENDENT_AMBULATORY_CARE_PROVIDER_SITE_OTHER): Payer: 59 | Admitting: Cardiology

## 2015-08-07 VITALS — BP 137/72 | HR 80 | Ht 64.0 in | Wt 193.2 lb

## 2015-08-07 DIAGNOSIS — I739 Peripheral vascular disease, unspecified: Secondary | ICD-10-CM | POA: Diagnosis not present

## 2015-08-07 DIAGNOSIS — Z01818 Encounter for other preprocedural examination: Secondary | ICD-10-CM | POA: Diagnosis not present

## 2015-08-07 NOTE — Patient Instructions (Signed)
Medication Instructions:  Continue current medications  Labwork: NONE  Testing/Procedures: Your physician has requested that you have a lexiscan myoview. For further information please visit [email or web address]. Please follow instruction sheet, as given.   Follow-Up: Your physician recommends that you schedule a follow-up appointment in: As Needed   Any Other Special Instructions Will Be Listed Below (If Applicable).   If you need a refill on your cardiac medications before your next appointment, please call your pharmacy.   

## 2015-08-11 ENCOUNTER — Encounter: Payer: 59 | Admitting: Surgery

## 2015-08-12 ENCOUNTER — Other Ambulatory Visit (HOSPITAL_COMMUNITY): Payer: Self-pay | Admitting: *Deleted

## 2015-08-12 NOTE — Pre-Procedure Instructions (Addendum)
[place]  08/12/2015      [facility] - [place] - [address] [facility] [address] [facility] [address] [place] [address] Phone: [phone] Fax: [phone]  [facility] - [place], [place] - [address] [place] [address] Phone: [phone] Fax: [phone]    Your procedure is scheduled on Friday, August 22, 2015 at 7:30 AM.   Report to [facility] Entrance "A" Admitting Office at 5:30 AM.   Call this number if you have problems the morning of surgery: [phone]   [phone]   Remember:  Do not eat food or drink liquids after midnight Thursday, 08/21/15.   Take these medicines the morning of surgery with A SIP OF WATER: Aspirin, Metoprolol (Lopressor), Omeprazole (Prilosec)  Follow your MD's instructions for Plavix.    WHAT DO I DO ABOUT MY DIABETES MEDICATION?   Marland Kitchen Do not take oral diabetes medicines (pills) the morning of surgery.  Do NOT take Metformin the morning of surgery.     Do not wear jewelry, make-up or nail polish.  Do not wear lotions, powders, or perfumes.  You may wear deoderant.  Do not shave 48 hours prior to surgery.    Do not bring valuables to the hospital.   [facility] is not responsible for any belongings or valuables.  Contacts, dentures or bridgework may not be worn into surgery.  Leave your suitcase in the car.  After surgery it may be brought to your room.  For patients admitted to the hospital, discharge time will be determined by your treatment team.  Special instructions:  See "Preparing for Surgery" Instruction sheet.   Please read over the following fact sheets that you were given. Pain Booklet, Coughing and Deep Breathing, MRSA Information and Surgical Site Infection Prevention     How to Manage Your Diabetes Before and After Surgery  Why is it important to control my blood sugar before and after surgery? . Improving blood  sugar levels before and after surgery helps healing and can limit problems. . A way of improving blood sugar control is eating a healthy diet by: o  Eating less sugar and carbohydrates o  Increasing activity/exercise o  Talking with your doctor about reaching your blood sugar goals . High blood sugars (greater than 180 mg/dL) can raise your risk of infections and slow your recovery, so you will need to focus on controlling your diabetes during the weeks before surgery. . Make sure that the doctor who takes care of your diabetes knows about your planned surgery including the date and location.  How do I manage my blood sugar before surgery? . Check your blood sugar at least 4 times a day, starting 2 days before surgery, to make sure that the level is not too high or low. o Check your blood sugar the morning of your surgery when you wake up and every 2 hours until you get to the Short Stay unit. . If your blood sugar is less than 70 mg/dL, you will need to treat for low blood sugar: o Do not take insulin. o Treat a low blood sugar (less than 70 mg/dL) with  cup of clear juice (cranberry or apple), 4 glucose tablets, OR glucose gel. o Recheck blood sugar in 15 minutes after treatment (to make sure it is greater than 70 mg/dL). If your blood sugar is not greater than 70 mg/dL on recheck, call [phone] for further instructions. Marland Kitchen  Report your blood sugar to the short stay nurse when you get to Short Stay.  . If you are admitted to the hospital after surgery: o Your blood sugar will be checked by the staff and you will probably be given insulin after surgery (instead of oral diabetes medicines) to make sure you have good blood sugar levels. o The goal for blood sugar control after surgery is 80-180 mg/dL.

## 2015-08-13 ENCOUNTER — Encounter (HOSPITAL_COMMUNITY): Payer: Self-pay

## 2015-08-13 ENCOUNTER — Telehealth (HOSPITAL_COMMUNITY): Payer: Self-pay

## 2015-08-13 ENCOUNTER — Encounter (HOSPITAL_COMMUNITY)
Admission: RE | Admit: 2015-08-13 | Discharge: 2015-08-13 | Disposition: A | Discharge status: Home / Self Care | Payer: 59 | Source: Ambulatory Visit | Attending: Surgery | Admitting: Surgery

## 2015-08-13 DIAGNOSIS — I779 Disorder of arteries and arterioles, unspecified: Secondary | ICD-10-CM | POA: Diagnosis not present

## 2015-08-13 DIAGNOSIS — Z8249 Family history of ischemic heart disease and other diseases of the circulatory system: Secondary | ICD-10-CM | POA: Diagnosis not present

## 2015-08-13 DIAGNOSIS — R42 Dizziness and giddiness: Secondary | ICD-10-CM | POA: Diagnosis not present

## 2015-08-13 DIAGNOSIS — Z01818 Encounter for other preprocedural examination: Secondary | ICD-10-CM | POA: Diagnosis not present

## 2015-08-13 DIAGNOSIS — I10 Essential (primary) hypertension: Secondary | ICD-10-CM | POA: Diagnosis not present

## 2015-08-13 DIAGNOSIS — R0609 Other forms of dyspnea: Secondary | ICD-10-CM | POA: Diagnosis not present

## 2015-08-13 DIAGNOSIS — Z6833 Body mass index (BMI) 33.0-33.9, adult: Secondary | ICD-10-CM | POA: Diagnosis not present

## 2015-08-13 DIAGNOSIS — E119 Type 2 diabetes mellitus without complications: Secondary | ICD-10-CM | POA: Diagnosis not present

## 2015-08-13 DIAGNOSIS — I739 Peripheral vascular disease, unspecified: Secondary | ICD-10-CM | POA: Diagnosis not present

## 2015-08-13 DIAGNOSIS — E669 Obesity, unspecified: Secondary | ICD-10-CM | POA: Diagnosis not present

## 2015-08-13 HISTORY — DX: Cardiac murmur, unspecified: R01.1

## 2015-08-13 HISTORY — DX: Headache, unspecified: R51.9

## 2015-08-13 HISTORY — DX: Nonrheumatic aortic (valve) stenosis: I35.0

## 2015-08-13 HISTORY — DX: Headache: R51

## 2015-08-13 HISTORY — DX: Nausea with vomiting, unspecified: R11.2

## 2015-08-13 HISTORY — DX: Other specified postprocedural states: Z98.890

## 2015-08-13 LAB — COMPREHENSIVE METABOLIC PANEL
ALBUMIN: 3.8 g/dL (ref 3.5–5.0)
ALT: 16 U/L (ref 14–54)
ANION GAP: 6 (ref 5–15)
AST: 19 U/L (ref 15–41)
Alkaline Phosphatase: 65 U/L (ref 38–126)
BILIRUBIN TOTAL: 0.7 mg/dL (ref 0.3–1.2)
BUN: 12 mg/dL (ref 6–20)
CHLORIDE: 106 mmol/L (ref 101–111)
CO2: 27 mmol/L (ref 22–32)
Calcium: 9.4 mg/dL (ref 8.9–10.3)
Creatinine, Ser: 0.93 mg/dL (ref 0.44–1.00)
GFR calc Af Amer: >60 mL/min (ref >60)
Glucose, Bld: 121 mg/dL — ABNORMAL HIGH (ref 65–99)
POTASSIUM: 4.2 mmol/L (ref 3.5–5.1)
Sodium: 139 mmol/L (ref 135–145)
TOTAL PROTEIN: 6.3 g/dL — AB (ref 6.5–8.1)

## 2015-08-13 LAB — CBC
HEMATOCRIT: 36.3 % (ref 36.0–46.0)
Hemoglobin: 11.1 g/dL — ABNORMAL LOW (ref 12.0–15.0)
MCH: 25.1 pg — ABNORMAL LOW (ref 26.0–34.0)
MCHC: 30.6 g/dL (ref 30.0–36.0)
MCV: 81.9 fL (ref 78.0–100.0)
PLATELETS: 121 10*3/uL — AB (ref 150–400)
RBC: 4.43 MIL/uL (ref 3.87–5.11)
RDW: 14.6 % (ref 11.5–15.5)
WBC: 4.3 10*3/uL (ref 4.0–10.5)

## 2015-08-13 LAB — URINALYSIS, ROUTINE W REFLEX MICROSCOPIC
BILIRUBIN URINE: NEGATIVE
GLUCOSE, UA: NEGATIVE mg/dL
HGB URINE DIPSTICK: NEGATIVE
KETONES UR: NEGATIVE mg/dL
Nitrite: NEGATIVE
PH: 5 (ref 5.0–8.0)
Protein, ur: NEGATIVE mg/dL
SPECIFIC GRAVITY, URINE: 1.02 (ref 1.005–1.030)

## 2015-08-13 LAB — TYPE AND SCREEN
ABO/RH(D): O NEG
ANTIBODY SCREEN: NEGATIVE

## 2015-08-13 LAB — SURGICAL PCR SCREEN
MRSA, PCR: POSITIVE — AB
Staphylococcus aureus: POSITIVE — AB

## 2015-08-13 LAB — PROTIME-INR
INR: 1.04 (ref 0.00–1.49)
PROTHROMBIN TIME: 13.8 s (ref 11.6–15.2)

## 2015-08-13 LAB — URINE MICROSCOPIC-ADD ON
Bacteria, UA: NONE SEEN
RBC / HPF: NONE SEEN RBC/hpf (ref 0–5)

## 2015-08-13 LAB — APTT: APTT: 29 s (ref 24–37)

## 2015-08-13 LAB — GLUCOSE, CAPILLARY: GLUCOSE-CAPILLARY: 135 mg/dL — AB (ref 65–99)

## 2015-08-13 MED FILL — MUPIROCIN 2% OINTMENT: 2 | 5 days supply | Qty: 22 | Fill #0

## 2015-08-13 NOTE — Progress Notes (Addendum)
Anesthesia Chart Review: Patient is a 70 year old female scheduled for left CEA on 08/22/15 by Dr. Myra Gianotti.   History includes non-smoker, post-operative N/V, HTN, DM2, HLD, GERD, anemia, PVD s/p left FPBG '07 and right fem-AT BPG '10, varicose veins, migraines, mild AS (echo 04/2015), T&A, diverticular abscess s/p sigmoid colectomy '10. PCP is Dr. Creola Corn.  She was referred to cardiologist Dr. Antoine Poche for preoperative evaluation. Stress test was ordered.   Meds include ASA 325mg , Plavix, metformin, Benicar HCT, Prilosec, tramadol. She is to continue ASA and Plavix per Dr. Myra Gianotti.  08/07/15 EKG: NSR, non-specific T wave abnormality.  08/14/15 Nuclear stress test: PENDING.  04/22/15 Echo: Study Conclusions - Left ventricle: The cavity size was normal. There was mild  concentric hypertrophy. Systolic function was normal. The  estimated ejection fraction was in the range of 60% to 65%. Wall  motion was normal; there were no regional wall motion  abnormalities. Doppler parameters are consistent with abnormal  left ventricular relaxation (grade 1 diastolic dysfunction).  Doppler parameters are consistent with high ventricular filling  pressure. - Aortic valve: Valve mobility was restricted. There was mild  stenosis. Peak velocity (S): 277 cm/s. Mean gradient (S): 11 mm  Hg. - Mitral valve: Moderately calcified annulus. - Right atrium: The atrium was mildly dilated. Impressions: - No significant change in aortic stenosis.  07/29/15 Carotid U/S: Summary: 1. Right internal carotid artery demonstrated a 40-59% stenosis  based on velocity. It is noted that the increased velocities may  be over estimated due to tortuosity of the vessel. 2. Left internal carotid artery demonstrated a high grade stenosis  80-99% at the origin of the distal common carotid/internal  carotid artery.  05/11/13 Abdominal aorta U/S: Largest diameter of the abdominal aorta is 1.6 x 1.6  cm.  Preoperative labs noted. Cr 0.93. Glucose 121. H/H 11.1/36.3. PT/PTT WNL. UA showed trace leukocytes, but negative nitrites. A1c was 6.9 on 07/25/15 (PCP).  Will leave chart for follow-up regarding stress test results.  Velna Ochs [facility] Short Stay Center/Anesthesiology Phone [phone] 08/13/2015 5:37 PM  Addendum:  08/14/15 Nuclear stress test:  The left ventricular ejection fraction is hyperdynamic (>65%).  Nuclear stress EF: 72%.  No T wave inversion was noted during stress.  There was no ST segment deviation noted during stress.  The study is normal.  This is a low risk study. Normal perfusion. LVEF 72% with normal wall motion. This is a low risk study.  Per Dr. Antoine Poche: "No evidence of ischemia. Call Ms. Isidore with the results and send results to Gwen Pounds, MD. Therefore, based on ACC/AHA guidelines, the patient would be at acceptable risk for the planned procedure without further cardiovascular testing."  If no acute changes then I would anticipate that she can proceed as planned.  Velna Ochs [facility] Short Stay Center/Anesthesiology Phone [phone] 08/15/2015 10:43 AM

## 2015-08-13 NOTE — Progress Notes (Signed)
PCP - Dr. Creola Corn Cardiologist - Dr. Antoine Poche  EKG - 08/07/15 CXR - denies  Echo - 04/22/15 Stress test - scheduled for 08/14/15 Cardiac Cath - denies  Patient denies chest pain and shortness of breath at PAT appointment.  Patient informed nurse that Dr. Myra Gianotti instructed her to continue taking Aspirin and Plavix, including the morning of surgery.    Patient states that she only checks her blood sugars once a week at most and does not know her fasting glucose.  Patient states that Dr. Timothy Lasso recently collected an A1C.  Nurse has requested A1C from Dr. Ferd Hibbs office.

## 2015-08-13 NOTE — Telephone Encounter (Signed)
Encounter complete. 

## 2015-08-13 NOTE — Progress Notes (Signed)
Patient notified of positive PCR result and verbalized understanding.  Prescription called into [facility].

## 2015-08-14 ENCOUNTER — Ambulatory Visit (HOSPITAL_COMMUNITY)
Admission: RE | Admit: 2015-08-14 | Discharge: 2015-08-14 | Disposition: A | Discharge status: Home / Self Care | Payer: 59 | Source: Ambulatory Visit | Attending: Cardiology | Admitting: Cardiology

## 2015-08-14 DIAGNOSIS — E669 Obesity, unspecified: Secondary | ICD-10-CM | POA: Diagnosis not present

## 2015-08-14 DIAGNOSIS — I779 Disorder of arteries and arterioles, unspecified: Secondary | ICD-10-CM | POA: Diagnosis not present

## 2015-08-14 DIAGNOSIS — R42 Dizziness and giddiness: Secondary | ICD-10-CM | POA: Insufficient documentation

## 2015-08-14 DIAGNOSIS — I10 Essential (primary) hypertension: Secondary | ICD-10-CM | POA: Insufficient documentation

## 2015-08-14 DIAGNOSIS — Z01818 Encounter for other preprocedural examination: Secondary | ICD-10-CM

## 2015-08-14 DIAGNOSIS — I739 Peripheral vascular disease, unspecified: Secondary | ICD-10-CM

## 2015-08-14 DIAGNOSIS — E119 Type 2 diabetes mellitus without complications: Secondary | ICD-10-CM | POA: Insufficient documentation

## 2015-08-14 DIAGNOSIS — R0609 Other forms of dyspnea: Secondary | ICD-10-CM | POA: Insufficient documentation

## 2015-08-14 DIAGNOSIS — Z6833 Body mass index (BMI) 33.0-33.9, adult: Secondary | ICD-10-CM | POA: Insufficient documentation

## 2015-08-14 DIAGNOSIS — Z8249 Family history of ischemic heart disease and other diseases of the circulatory system: Secondary | ICD-10-CM | POA: Diagnosis not present

## 2015-08-14 LAB — MYOCARDIAL PERFUSION IMAGING
CHL CUP NUCLEAR SDS: 0
CHL CUP RESTING HR STRESS: 71 {beats}/min
CSEPPHR: 93 {beats}/min
LV dias vol: 85 mL (ref 46–106)
LVSYSVOL: 24 mL
SRS: 1
SSS: 1
TID: 0.99

## 2015-08-14 MED ORDER — AMINOPHYLLINE 25 MG/ML IV SOLN
75.0000 mg | Freq: Once | INTRAVENOUS | Status: AC
  Administered 2015-08-14: 75 mg via INTRAVENOUS

## 2015-08-14 MED ORDER — TECHNETIUM TC 99M TETROFOSMIN IV KIT
10.6000 | PACK | Freq: Once | INTRAVENOUS | Status: AC | PRN
  Administered 2015-08-14: 11 via INTRAVENOUS
  Filled 2015-08-14: qty 11

## 2015-08-14 MED ORDER — TECHNETIUM TC 99M TETROFOSMIN IV KIT
30.3000 | PACK | Freq: Once | INTRAVENOUS | Status: AC | PRN
  Administered 2015-08-14: 30.3 via INTRAVENOUS
  Filled 2015-08-14: qty 30

## 2015-08-14 MED ORDER — REGADENOSON 0.4 MG/5ML IV SOLN: 0.4000 mg | Freq: Once | INTRAVENOUS | Status: DC

## 2015-08-14 MED ORDER — REGADENOSON 0.4 MG/5ML IV SOLN
0.4000 mg | Freq: Once | INTRAVENOUS | Status: AC
  Administered 2015-08-14: 0.4 mg via INTRAVENOUS

## 2015-08-15 ENCOUNTER — Telehealth: Payer: Self-pay | Admitting: Cardiology

## 2015-08-15 NOTE — Telephone Encounter (Signed)
Result of stress test send to Dr Coral Else via epic

## 2015-08-15 NOTE — Telephone Encounter (Signed)
Please send her results to Dr Coral Else. She is going to have Carotid Endarterectomy.

## 2015-08-21 MED ORDER — SODIUM CHLORIDE 0.9 % IV SOLN
INTRAVENOUS | Status: DC
Start: 2015-08-22 — End: 2015-08-22

## 2015-08-21 MED ORDER — DEXTROSE 5 % IV SOLN
1.5000 g | INTRAVENOUS | Status: AC
  Administered 2015-08-22: 1.5 g via INTRAVENOUS
  Filled 2015-08-21: qty 1.5

## 2015-08-22 ENCOUNTER — Encounter (HOSPITAL_COMMUNITY)
Admission: RE | Disposition: A | Discharge status: Home / Self Care | Payer: Self-pay | Source: Ambulatory Visit | Attending: Surgery

## 2015-08-22 ENCOUNTER — Encounter (HOSPITAL_COMMUNITY): Payer: Self-pay | Admitting: *Deleted

## 2015-08-22 ENCOUNTER — Telehealth: Payer: Self-pay | Admitting: Surgery

## 2015-08-22 ENCOUNTER — Inpatient Hospital Stay (HOSPITAL_COMMUNITY): Payer: 59 | Admitting: Vascular Surgery

## 2015-08-22 ENCOUNTER — Inpatient Hospital Stay (HOSPITAL_COMMUNITY)
Admission: RE | Admit: 2015-08-22 | Discharge: 2015-08-23 | DRG: 039 | Disposition: A | Discharge status: Home / Self Care | Payer: 59 | Source: Ambulatory Visit | Attending: Surgery | Admitting: Surgery

## 2015-08-22 ENCOUNTER — Inpatient Hospital Stay (HOSPITAL_COMMUNITY): Payer: 59 | Admitting: Anesthesiology

## 2015-08-22 DIAGNOSIS — K219 Gastro-esophageal reflux disease without esophagitis: Secondary | ICD-10-CM | POA: Diagnosis present

## 2015-08-22 DIAGNOSIS — I70209 Unspecified atherosclerosis of native arteries of extremities, unspecified extremity: Secondary | ICD-10-CM | POA: Diagnosis present

## 2015-08-22 DIAGNOSIS — Z885 Allergy status to narcotic agent status: Secondary | ICD-10-CM | POA: Diagnosis not present

## 2015-08-22 DIAGNOSIS — D649 Anemia, unspecified: Secondary | ICD-10-CM | POA: Diagnosis not present

## 2015-08-22 DIAGNOSIS — I6523 Occlusion and stenosis of bilateral carotid arteries: Principal | ICD-10-CM | POA: Diagnosis present

## 2015-08-22 DIAGNOSIS — I739 Peripheral vascular disease, unspecified: Secondary | ICD-10-CM | POA: Diagnosis not present

## 2015-08-22 DIAGNOSIS — Z8249 Family history of ischemic heart disease and other diseases of the circulatory system: Secondary | ICD-10-CM

## 2015-08-22 DIAGNOSIS — I10 Essential (primary) hypertension: Secondary | ICD-10-CM | POA: Diagnosis present

## 2015-08-22 DIAGNOSIS — R590 Localized enlarged lymph nodes: Secondary | ICD-10-CM | POA: Diagnosis not present

## 2015-08-22 DIAGNOSIS — Z7982 Long term (current) use of aspirin: Secondary | ICD-10-CM | POA: Diagnosis not present

## 2015-08-22 DIAGNOSIS — E785 Hyperlipidemia, unspecified: Secondary | ICD-10-CM | POA: Diagnosis present

## 2015-08-22 DIAGNOSIS — I499 Cardiac arrhythmia, unspecified: Secondary | ICD-10-CM | POA: Diagnosis present

## 2015-08-22 DIAGNOSIS — Z7984 Long term (current) use of oral hypoglycemic drugs: Secondary | ICD-10-CM | POA: Diagnosis not present

## 2015-08-22 DIAGNOSIS — E1151 Type 2 diabetes mellitus with diabetic peripheral angiopathy without gangrene: Secondary | ICD-10-CM | POA: Diagnosis present

## 2015-08-22 DIAGNOSIS — I6522 Occlusion and stenosis of left carotid artery: Secondary | ICD-10-CM | POA: Diagnosis not present

## 2015-08-22 DIAGNOSIS — I6529 Occlusion and stenosis of unspecified carotid artery: Secondary | ICD-10-CM | POA: Diagnosis present

## 2015-08-22 DIAGNOSIS — Z79899 Other long term (current) drug therapy: Secondary | ICD-10-CM | POA: Diagnosis not present

## 2015-08-22 HISTORY — PX: ENDARTERECTOMY: SHX5162

## 2015-08-22 HISTORY — PX: PATCH ANGIOPLASTY: SHX6230

## 2015-08-22 LAB — CBC
HEMATOCRIT: 34.6 % — AB (ref 36.0–46.0)
Hemoglobin: 10.7 g/dL — ABNORMAL LOW (ref 12.0–15.0)
MCH: 25.5 pg — AB (ref 26.0–34.0)
MCHC: 30.9 g/dL (ref 30.0–36.0)
MCV: 82.6 fL (ref 78.0–100.0)
Platelets: 125 10*3/uL — ABNORMAL LOW (ref 150–400)
RBC: 4.19 MIL/uL (ref 3.87–5.11)
RDW: 14.4 % (ref 11.5–15.5)
WBC: 4.7 10*3/uL (ref 4.0–10.5)

## 2015-08-22 LAB — GLUCOSE, CAPILLARY
GLUCOSE-CAPILLARY: 161 mg/dL — AB (ref 65–99)
GLUCOSE-CAPILLARY: 226 mg/dL — AB (ref 65–99)
GLUCOSE-CAPILLARY: 165 mg/dL — AB (ref 65–99)
Glucose-Capillary: 140 mg/dL — ABNORMAL HIGH (ref 65–99)
Glucose-Capillary: 142 mg/dL — ABNORMAL HIGH (ref 65–99)

## 2015-08-22 LAB — CREATININE, SERUM
Creatinine, Ser: 0.84 mg/dL (ref 0.44–1.00)
GFR calc non Af Amer: >60 mL/min (ref >60)

## 2015-08-22 SURGERY — ENDARTERECTOMY, CAROTID
Anesthesia: General | Site: Neck | Laterality: Left

## 2015-08-22 MED ORDER — IRBESARTAN 300 MG PO TABS: 150.0000 mg | ORAL_TABLET | Freq: Every day | ORAL | Status: DC

## 2015-08-22 MED ORDER — METOPROLOL TARTRATE 50 MG PO TABS
50.0000 mg | ORAL_TABLET | Freq: Two times a day (BID) | ORAL | Status: DC
  Administered 2015-08-22 – 2015-08-23 (×2): 50 mg via ORAL
  Filled 2015-08-22 (×2): qty 1

## 2015-08-22 MED ORDER — METFORMIN HCL 500 MG PO TABS: 500.0000 mg | ORAL_TABLET | Freq: Two times a day (BID) | ORAL | Status: DC

## 2015-08-22 MED ORDER — SODIUM CHLORIDE 0.9 % IV SOLN
10.0000 mg | INTRAVENOUS | Status: DC | PRN
  Administered 2015-08-22: 50 ug/min via INTRAVENOUS

## 2015-08-22 MED ORDER — ENOXAPARIN SODIUM 40 MG/0.4ML ~~LOC~~ SOLN
40.0000 mg | SUBCUTANEOUS | Status: DC
Start: 2015-08-23 — End: 2015-08-23

## 2015-08-22 MED ORDER — EZETIMIBE-SIMVASTATIN 10-40 MG PO TABS
1.0000 | ORAL_TABLET | Freq: Every day | ORAL | Status: DC
  Filled 2015-08-22: qty 1

## 2015-08-22 MED ORDER — 0.9 % SODIUM CHLORIDE (POUR BTL) OPTIME
TOPICAL | Status: DC | PRN
  Administered 2015-08-22: 1000 mL

## 2015-08-22 MED ORDER — LIDOCAINE HCL (PF) 1 % IJ SOLN
INTRAMUSCULAR | Status: AC
  Filled 2015-08-22: qty 30

## 2015-08-22 MED ORDER — MELOXICAM 7.5 MG PO TABS: 15.0000 mg | ORAL_TABLET | Freq: Every day | ORAL | Status: DC | PRN

## 2015-08-22 MED ORDER — GLYCOPYRROLATE 0.2 MG/ML IJ SOLN
INTRAMUSCULAR | Status: DC | PRN
  Administered 2015-08-22: .2 mg via INTRAVENOUS

## 2015-08-22 MED ORDER — SODIUM CHLORIDE 0.9 % IV SOLN
INTRAVENOUS | Status: DC
  Administered 2015-08-22: 14:00:00 via INTRAVENOUS

## 2015-08-22 MED ORDER — LABETALOL HCL 5 MG/ML IV SOLN
10.0000 mg | INTRAVENOUS | Status: DC | PRN
  Administered 2015-08-22: 10 mg via INTRAVENOUS

## 2015-08-22 MED ORDER — POTASSIUM CHLORIDE CRYS ER 20 MEQ PO TBCR: 20.0000 meq | EXTENDED_RELEASE_TABLET | Freq: Every day | ORAL | Status: DC | PRN

## 2015-08-22 MED ORDER — PHENOL 1.4 % MT LIQD: 1.0000 | OROMUCOSAL | Status: DC | PRN

## 2015-08-22 MED ORDER — EPHEDRINE SULFATE 50 MG/ML IJ SOLN
INTRAMUSCULAR | Status: DC | PRN
  Administered 2015-08-22 (×2): 5 mg via INTRAVENOUS
  Administered 2015-08-22: 10 mg via INTRAVENOUS
  Administered 2015-08-22: 5 mg via INTRAVENOUS

## 2015-08-22 MED ORDER — TRAMADOL HCL 50 MG PO TABS: 50.0000 mg | ORAL_TABLET | Freq: Every evening | ORAL | Status: DC | PRN

## 2015-08-22 MED ORDER — HEPARIN SODIUM (PORCINE) 1000 UNIT/ML IJ SOLN
INTRAMUSCULAR | Status: DC | PRN
  Administered 2015-08-22: 1000 [IU] via INTRAVENOUS
  Administered 2015-08-22: 8000 [IU] via INTRAVENOUS

## 2015-08-22 MED ORDER — OLMESARTAN MEDOXOMIL-HCTZ 20-12.5 MG PO TABS: 1.0000 | ORAL_TABLET | Freq: Every day | ORAL | Status: DC

## 2015-08-22 MED ORDER — ONDANSETRON HCL 4 MG/2ML IJ SOLN: 4.0000 mg | Freq: Four times a day (QID) | INTRAMUSCULAR | Status: DC | PRN

## 2015-08-22 MED ORDER — FENTANYL CITRATE (PF) 100 MCG/2ML IJ SOLN
INTRAMUSCULAR | Status: DC | PRN
  Administered 2015-08-22 (×2): 50 ug via INTRAVENOUS

## 2015-08-22 MED ORDER — PROTAMINE SULFATE 10 MG/ML IV SOLN
INTRAVENOUS | Status: DC | PRN
  Administered 2015-08-22 (×3): 10 mg via INTRAVENOUS
  Administered 2015-08-22: 20 mg via INTRAVENOUS

## 2015-08-22 MED ORDER — MAGNESIUM SULFATE 2 GM/50ML IV SOLN: 2.0000 g | Freq: Every day | INTRAVENOUS | Status: DC | PRN

## 2015-08-22 MED ORDER — REMIFENTANIL HCL 1 MG IV SOLR
0.0125 ug/kg/min | INTRAVENOUS | Status: DC
  Filled 2015-08-22: qty 2000

## 2015-08-22 MED ORDER — HYDROMORPHONE HCL 1 MG/ML IJ SOLN: 0.2500 mg | INTRAMUSCULAR | Status: DC | PRN

## 2015-08-22 MED ORDER — PROPOFOL 10 MG/ML IV BOLUS
INTRAVENOUS | Status: AC
  Filled 2015-08-22: qty 20

## 2015-08-22 MED ORDER — LABETALOL HCL 5 MG/ML IV SOLN
INTRAVENOUS | Status: AC
  Filled 2015-08-22: qty 4

## 2015-08-22 MED ORDER — CHLORHEXIDINE GLUCONATE CLOTH 2 % EX PADS: 6.0000 | MEDICATED_PAD | Freq: Once | CUTANEOUS | Status: DC

## 2015-08-22 MED ORDER — FENTANYL CITRATE (PF) 250 MCG/5ML IJ SOLN
INTRAMUSCULAR | Status: AC
  Filled 2015-08-22: qty 5

## 2015-08-22 MED ORDER — DEXTROSE 5 % IV SOLN
1.5000 g | Freq: Two times a day (BID) | INTRAVENOUS | Status: AC
  Administered 2015-08-22 – 2015-08-23 (×2): 1.5 g via INTRAVENOUS
  Filled 2015-08-22 (×2): qty 1.5

## 2015-08-22 MED ORDER — HEPARIN SODIUM (PORCINE) 5000 UNIT/ML IJ SOLN
INTRAMUSCULAR | Status: DC | PRN
  Administered 2015-08-22: 08:00:00

## 2015-08-22 MED ORDER — METOPROLOL TARTRATE 5 MG/5ML IV SOLN: 2.0000 mg | INTRAVENOUS | Status: DC | PRN

## 2015-08-22 MED ORDER — HYDRALAZINE HCL 20 MG/ML IJ SOLN: 5.0000 mg | INTRAMUSCULAR | Status: DC | PRN

## 2015-08-22 MED ORDER — SENNOSIDES-DOCUSATE SODIUM 8.6-50 MG PO TABS: 1.0000 | ORAL_TABLET | Freq: Every evening | ORAL | Status: DC | PRN

## 2015-08-22 MED ORDER — REMIFENTANIL HCL 2 MG IV SOLR
INTRAVENOUS | Status: DC | PRN
  Administered 2015-08-22: .1 ug/kg/min via INTRAVENOUS

## 2015-08-22 MED ORDER — SUGAMMADEX SODIUM 200 MG/2ML IV SOLN
INTRAVENOUS | Status: DC | PRN
  Administered 2015-08-22: 175 mg via INTRAVENOUS

## 2015-08-22 MED ORDER — INSULIN ASPART 100 UNIT/ML ~~LOC~~ SOLN
0.0000 [IU] | Freq: Three times a day (TID) | SUBCUTANEOUS | Status: DC
  Administered 2015-08-22: 3 [IU] via SUBCUTANEOUS

## 2015-08-22 MED ORDER — MORPHINE SULFATE (PF) 2 MG/ML IV SOLN: 2.0000 mg | INTRAVENOUS | Status: DC | PRN

## 2015-08-22 MED ORDER — ACETAMINOPHEN 325 MG PO TABS
325.0000 mg | ORAL_TABLET | ORAL | Status: DC | PRN
  Administered 2015-08-22 (×2): 650 mg via ORAL
  Filled 2015-08-22 (×2): qty 2

## 2015-08-22 MED ORDER — ROCURONIUM BROMIDE 100 MG/10ML IV SOLN
INTRAVENOUS | Status: DC | PRN
  Administered 2015-08-22: 40 mg via INTRAVENOUS

## 2015-08-22 MED ORDER — ACETAMINOPHEN 325 MG RE SUPP: 325.0000 mg | RECTAL | Status: DC | PRN

## 2015-08-22 MED ORDER — DOCUSATE SODIUM 100 MG PO CAPS
100.0000 mg | ORAL_CAPSULE | Freq: Every day | ORAL | Status: DC
  Administered 2015-08-23: 100 mg via ORAL
  Filled 2015-08-22: qty 1

## 2015-08-22 MED ORDER — EZETIMIBE-SIMVASTATIN 10-40 MG PO TABS: 1.0000 | ORAL_TABLET | Freq: Every day | ORAL | Status: DC

## 2015-08-22 MED ORDER — LIDOCAINE HCL (CARDIAC) 20 MG/ML IV SOLN
INTRAVENOUS | Status: DC | PRN
  Administered 2015-08-22: 140 mg via INTRAVENOUS
  Administered 2015-08-22: 50 mg via INTRAVENOUS
  Administered 2015-08-22: 100 mg via INTRAVENOUS

## 2015-08-22 MED ORDER — LACTATED RINGERS IV SOLN
INTRAVENOUS | Status: DC | PRN
  Administered 2015-08-22 (×2): via INTRAVENOUS

## 2015-08-22 MED ORDER — SODIUM CHLORIDE 0.9 % IV SOLN: 500.0000 mL | Freq: Once | INTRAVENOUS | Status: DC | PRN

## 2015-08-22 MED ORDER — LIDOCAINE 2% (20 MG/ML) 5 ML SYRINGE
INTRAMUSCULAR | Status: AC
  Filled 2015-08-22: qty 5

## 2015-08-22 MED ORDER — MEPERIDINE HCL 25 MG/ML IJ SOLN: 6.2500 mg | INTRAMUSCULAR | Status: DC | PRN

## 2015-08-22 MED ORDER — ROCURONIUM BROMIDE 50 MG/5ML IV SOLN
INTRAVENOUS | Status: AC
  Filled 2015-08-22: qty 1

## 2015-08-22 MED ORDER — HEMOSTATIC AGENTS (NO CHARGE) OPTIME
TOPICAL | Status: DC | PRN
  Administered 2015-08-22: 1 via TOPICAL

## 2015-08-22 MED ORDER — METFORMIN HCL 500 MG PO TABS
1000.0000 mg | ORAL_TABLET | Freq: Every day | ORAL | Status: DC
  Administered 2015-08-23: 1000 mg via ORAL
  Filled 2015-08-22: qty 2

## 2015-08-22 MED ORDER — ONDANSETRON HCL 4 MG/2ML IJ SOLN
INTRAMUSCULAR | Status: AC
  Filled 2015-08-22: qty 2

## 2015-08-22 MED ORDER — GUAIFENESIN-DM 100-10 MG/5ML PO SYRP: 15.0000 mL | ORAL_SOLUTION | ORAL | Status: DC | PRN

## 2015-08-22 MED ORDER — LABETALOL HCL 5 MG/ML IV SOLN: 10.0000 mg | INTRAVENOUS | Status: DC | PRN

## 2015-08-22 MED ORDER — HYDROCHLOROTHIAZIDE 12.5 MG PO CAPS: 12.5000 mg | ORAL_CAPSULE | Freq: Every day | ORAL | Status: DC

## 2015-08-22 MED ORDER — TRAMADOL HCL 50 MG PO TABS: 50.0000 mg | ORAL_TABLET | Freq: Four times a day (QID) | ORAL | Status: DC | PRN

## 2015-08-22 MED ORDER — ASPIRIN EC 325 MG PO TBEC
325.0000 mg | DELAYED_RELEASE_TABLET | Freq: Every day | ORAL | Status: DC
  Administered 2015-08-23: 325 mg via ORAL
  Filled 2015-08-22: qty 1

## 2015-08-22 MED ORDER — PROPOFOL 10 MG/ML IV BOLUS
INTRAVENOUS | Status: DC | PRN
  Administered 2015-08-22: 150 mg via INTRAVENOUS

## 2015-08-22 MED ORDER — PROMETHAZINE HCL 25 MG/ML IJ SOLN: 6.2500 mg | INTRAMUSCULAR | Status: DC | PRN

## 2015-08-22 MED ORDER — METFORMIN HCL 500 MG PO TABS
500.0000 mg | ORAL_TABLET | Freq: Every day | ORAL | Status: DC
  Administered 2015-08-22: 500 mg via ORAL
  Filled 2015-08-22: qty 1

## 2015-08-22 MED ORDER — ALUM & MAG HYDROXIDE-SIMETH 200-200-20 MG/5ML PO SUSP
15.0000 mL | ORAL | Status: DC | PRN
Start: 2015-08-22 — End: 2015-08-23

## 2015-08-22 MED ORDER — CLOPIDOGREL BISULFATE 75 MG PO TABS
75.0000 mg | ORAL_TABLET | Freq: Every day | ORAL | Status: DC
  Administered 2015-08-23: 75 mg via ORAL
  Filled 2015-08-22: qty 1

## 2015-08-22 MED ORDER — PANTOPRAZOLE SODIUM 40 MG PO TBEC
40.0000 mg | DELAYED_RELEASE_TABLET | Freq: Every day | ORAL | Status: DC
  Administered 2015-08-23: 40 mg via ORAL
  Filled 2015-08-22: qty 1

## 2015-08-22 MED ORDER — PHENYLEPHRINE HCL 10 MG/ML IJ SOLN
INTRAMUSCULAR | Status: DC | PRN
  Administered 2015-08-22 (×3): 40 ug via INTRAVENOUS
  Administered 2015-08-22: 80 ug via INTRAVENOUS

## 2015-08-22 SURGICAL SUPPLY — 53 items
CANISTER SUCTION 2500CC (MISCELLANEOUS) ×2 IMPLANT
CATH ROBINSON RED A/P 18FR (CATHETERS) ×2 IMPLANT
CATH SUCT 10FR WHISTLE TIP (CATHETERS) ×2 IMPLANT
CLIP TI MEDIUM 6 (CLIP) ×2 IMPLANT
CLIP TI WIDE RED SMALL 6 (CLIP) ×2 IMPLANT
CONT SPEC 4OZ CLIKSEAL STRL BL (MISCELLANEOUS) ×2 IMPLANT
COVER PROBE W GEL 5X96 (DRAPES) ×2 IMPLANT
CRADLE DONUT ADULT HEAD (MISCELLANEOUS) ×2 IMPLANT
DRAIN CHANNEL 15F RND FF W/TCR (WOUND CARE) IMPLANT
ELECT REM PT RETURN 9FT ADLT (ELECTROSURGICAL) ×2
ELECTRODE REM PT RTRN 9FT ADLT (ELECTROSURGICAL) ×1 IMPLANT
EVACUATOR SILICONE 100CC (DRAIN) IMPLANT
GAUZE SPONGE 4X4 12PLY STRL (GAUZE/BANDAGES/DRESSINGS) ×2 IMPLANT
GLOVE BIO SURGEON STRL SZ7.5 (GLOVE) ×2 IMPLANT
GLOVE BIOGEL PI IND STRL 6.5 (GLOVE) ×1 IMPLANT
GLOVE BIOGEL PI IND STRL 7.0 (GLOVE) ×1 IMPLANT
GLOVE BIOGEL PI IND STRL 7.5 (GLOVE) ×1 IMPLANT
GLOVE BIOGEL PI INDICATOR 6.5 (GLOVE) ×1
GLOVE BIOGEL PI INDICATOR 7.0 (GLOVE) ×1
GLOVE BIOGEL PI INDICATOR 7.5 (GLOVE) ×1
GLOVE ECLIPSE 6.5 STRL STRAW (GLOVE) ×4 IMPLANT
GLOVE ECLIPSE 7.0 STRL STRAW (GLOVE) ×2 IMPLANT
GLOVE SURG SS PI 7.5 STRL IVOR (GLOVE) ×2 IMPLANT
GOWN STRL REUS W/ TWL LRG LVL3 (GOWN DISPOSABLE) ×2 IMPLANT
GOWN STRL REUS W/ TWL XL LVL3 (GOWN DISPOSABLE) ×1 IMPLANT
GOWN STRL REUS W/TWL LRG LVL3 (GOWN DISPOSABLE) ×2
GOWN STRL REUS W/TWL XL LVL3 (GOWN DISPOSABLE) ×1
HEMOSTAT SNOW SURGICEL 2X4 (HEMOSTASIS) ×2 IMPLANT
INSERT FOGARTY SM (MISCELLANEOUS) IMPLANT
KIT BASIN OR (CUSTOM PROCEDURE TRAY) ×2 IMPLANT
KIT ROOM TURNOVER OR (KITS) ×2 IMPLANT
KIT SHUNT ARGYLE CAROTID ART 6 (VASCULAR PRODUCTS) ×2 IMPLANT
LIQUID BAND (GAUZE/BANDAGES/DRESSINGS) ×2 IMPLANT
NEEDLE HYPO 25GX1X1/2 BEV (NEEDLE) IMPLANT
NS IRRIG 1000ML POUR BTL (IV SOLUTION) ×6 IMPLANT
PACK CAROTID (CUSTOM PROCEDURE TRAY) ×2 IMPLANT
PAD ARMBOARD 7.5X6 YLW CONV (MISCELLANEOUS) ×4 IMPLANT
PATCH VASC XENOSURE 1CMX6CM (Vascular Products) ×1 IMPLANT
PATCH VASC XENOSURE 1X6 (Vascular Products) ×1 IMPLANT
SHUNT CAROTID BYPASS 10 (VASCULAR PRODUCTS) IMPLANT
SHUNT CAROTID BYPASS 12FRX15.5 (VASCULAR PRODUCTS) IMPLANT
SPONGE INTESTINAL PEANUT (DISPOSABLE) ×2 IMPLANT
SUT ETHILON 3 0 PS 1 (SUTURE) IMPLANT
SUT PROLENE 6 0 BV (SUTURE) ×6 IMPLANT
SUT PROLENE 7 0 BV 1 (SUTURE) IMPLANT
SUT PROLENE 7 0 BV1 MDA (SUTURE) ×2 IMPLANT
SUT SILK 3 0 TIES 17X18 (SUTURE)
SUT SILK 3-0 18XBRD TIE BLK (SUTURE) IMPLANT
SUT VIC AB 3-0 SH 27 (SUTURE) ×2
SUT VIC AB 3-0 SH 27X BRD (SUTURE) ×2 IMPLANT
SUT VICRYL 4-0 PS2 18IN ABS (SUTURE) ×2 IMPLANT
SYR CONTROL 10ML LL (SYRINGE) IMPLANT
WATER STERILE IRR 1000ML POUR (IV SOLUTION) ×2 IMPLANT

## 2015-08-22 NOTE — H&P (View-Only) (Signed)
[facility]ist [facility] [place]  Patient name: Paula [name] MRN: [id number] DOB: 1945/08/21 Sex: female  REASON FOR VISIT: carotid stenosis  HPI: The patient comes back today for followup. She is well-known to our office. She is status post right femoral to anterior tibial bypass graft by Dr. Madilyn Fireman as well as a left femoral to below knee popliteal artery bypass graft by Dr. Madilyn Fireman. These were both done for limb salvage. She recently was found to have a stenosis potentially on the left leg and underwent arteriogram in December. I was unable to detect any hemodynamically significant lesions  The patient was recently scheduled for a carotid duplex secondary to a bruit.  This identified 40-59% right carotid stenosis and greater than 80% left carotid stenosis.  The patient has a history of hemo-plegic migraines for many years.  Generally, will happen is that she gets numbness and tingling in her right arm and right leg.  She will take an aspirin and this will usually hold off the headache.  Her symptoms are in the right arm and right leg as well as left sided headaches.  Past Medical History  Diagnosis Date  . Hypertension   . Diabetes mellitus   . Hyperlipidemia   . PVD (peripheral vascular disease) (HCC)   . GERD (gastroesophageal reflux disease)   . Anemia   . Varicose veins     Family History  Problem Relation Age of Onset  . Dementia Mother   . Atrial fibrillation Mother   . Hiatal hernia Mother   . Hyperlipidemia Mother   . Hypertension Mother   . Other Mother     Varicose veins  . Varicose Veins Mother   . Heart disease Father     Before age 68  . AAA (abdominal aortic aneurysm) Father   . Hyperlipidemia Father     SOCIAL HISTORY: Social History  Substance Use Topics  . Smoking status: Never Smoker   . Smokeless tobacco: Never Used  . Alcohol Use: Yes     Comment: rarely    Allergies  Allergen Reactions  .  Codeine Nausea And Vomiting    Current Outpatient Prescriptions  Medication Sig Dispense Refill  . aspirin EC 325 MG tablet Take 325 mg by mouth daily.      . clopidogrel (PLAVIX) 75 MG tablet Take 75 mg by mouth daily.      Marland Kitchen ezetimibe-simvastatin (VYTORIN) 10-40 MG per tablet Take 1 tablet by mouth daily. 30 tablet 11  . metFORMIN (GLUCOPHAGE) 500 MG tablet Take 500 mg by mouth 2 (two) times daily with a meal. Take 2 Tabs in the morning and one at night    . metoprolol tartrate (LOPRESSOR) 25 MG tablet Take 25 mg by mouth 2 (two) times daily.      Marland Kitchen olmesartan-hydrochlorothiazide (BENICAR HCT) 20-12.5 MG per tablet Take 1 tablet by mouth daily.    Marland Kitchen omeprazole (PRILOSEC) 20 MG capsule Take 20 mg by mouth daily.      . traMADol (ULTRAM) 50 MG tablet Take 1 tablet (50 mg total) by mouth at bedtime as needed for pain. 12 tablet 0   No current facility-administered medications for this visit.    REVIEW OF SYSTEMS:  [X]  denotes positive finding, [ ]  denotes negative finding Cardiac  Comments:  Chest pain or chest pressure:    Shortness of breath upon exertion:    Short of breath when lying flat:    Irregular heart rhythm: x  Vascular    Pain in calf, thigh, or hip brought on by ambulation:    Pain in feet at night that wakes you up from your sleep:     Blood clot in your veins:    Leg swelling:  x       Pulmonary    Oxygen at home:    Productive cough:     Wheezing:         Neurologic    Sudden weakness in arms or legs:  x   Sudden numbness in arms or legs:  x   Sudden onset of difficulty speaking or slurred speech:    Temporary loss of vision in one eye:     Problems with dizziness:  x       Gastrointestinal    Blood in stool:     Vomited blood:         Genitourinary    Burning when urinating:     Blood in urine:        Psychiatric    Major depression:         Hematologic    Bleeding problems:    Problems with blood clotting too easily:        Skin      Rashes or ulcers:        Constitutional    Fever or chills:      PHYSICAL EXAM: Filed Vitals:   06/68/17 1351 08/04/15 1355  BP: 126/84 120/82  Pulse: 94   Height: 5' 3.5" (1.613 m)   Weight: 191 lb 6.4 oz (86.818 kg)   SpO2: 96%     GENERAL: The patient is a well-nourished female, in no acute distress. The vital signs are documented above. CARDIAC: There is a regular rate and rhythm.  VASCULAR: left carotid bruit PULMONARY: There is good air exchange bilaterally without wheezing or rales. ABDOMEN: Soft and non-tender with normal pitched bowel sounds.  MUSCULOSKELETAL: There are no major deformities or cyanosis. NEUROLOGIC: No focal weakness or paresthesias are detected. SKIN: There are no ulcers or rashes noted. PSYCHIATRIC: The patient has a normal affect.  DATA:  I have reviewed her carotid Doppler studies which revealed greater than 80% left carotid stenosis and 40-59% right carotid stenosis  Echocardiogram from March 2017 shows an ejection fraction of 60-65%  MEDICAL ISSUES: Symptomatic left carotid stenosis.  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  Regardless, we have decided to proceed with left carotid endarterectomy.  I discussed the risks and benefits of the operation including the risk of stroke and nerve injury as well as cardiopulmonary complications.  I will leave her on her Plavix and therefore there will be an increased risk of bleeding.  Her operation has been scheduled for July 7   Because of the interval between today and her surgery I will go ahead and get formal cardiac clearance.   Durene Cal, MD [facility] [phone] Pager [phone]

## 2015-08-22 NOTE — Telephone Encounter (Signed)
sched appt 7/26 at 9:30. Lm on hm# to inform pt.

## 2015-08-22 NOTE — Progress Notes (Signed)
Dr Bretta Bang at bedside aline bp 174100 bp med ordered and carried out

## 2015-08-22 NOTE — Progress Notes (Signed)
Report given to robin roberts rn as

## 2015-08-22 NOTE — Op Note (Signed)
Patient name: Paula [name] MRN: [id number] DOB: Feb 10, 1946 Sex: female  08/22/2015 Pre-operative Diagnosis: ? symptomatic   left carotid stenosis Post-operative diagnosis:  Same Surgeon:  Durene Cal Assistants:  Narda Amber Procedure:    left carotid Endarterectomy with bovine pericardial patch angioplasty Anesthesia:  General Blood Loss:  See anesthesia record Specimens:  Carotid Plaque to pathology  Findings:  90 %stenosis; Thrombus:  none  Indications:  The patient was found to have greater than 80% stenosis of her left carotid.  She has a history of migraine headaches with aura affecting the right side of her body.  It is unclear whether or not this is related to carotid stenosis or secondary to migraine issues.  She comes in today for endarterectomy  Procedure:  The patient was identified in the holding area and taken to [facility] OR ROOM 11  The patient was then placed supine on the table.   General endotrachial anesthesia was administered.  The patient was prepped and draped in the usual sterile fashion.  A time out was called and antibiotics were administered.  I used ultrasound to identify the carotid bifurcation in order to plan the incision.  The incision was made along the anterior border of the left sternocleidomastoid muscle.  Cautery was used to dissect through the subcutaneous tissue.  The platysma muscle was divided with cautery.  The internal jugular vein was exposed along its anterior medial border.  The common facial vein was exposed and then divided between 2-0 silk ties and metal clips.  The common carotid artery was then circumferentially exposed and encircled with an umbilical tape.  The vagus nerve was identified and protected.  Next sharp dissection was used to expose the external carotid artery and the superior thyroid artery.  The were encircled with a blue vessel loop and a 2-0 silk tie respectively.  Finally, the internal carotid was carefully dissected free.  An  umbilical tape was placed around the internal carotid artery distal to the diseased segment.  The hypoglossal nerve was visualized throughout and protected.  The patient was given systemic heparinization.  A bovine carotid patch was selected and prepared on the back table.  A 10 french and 8 [place] shunt were prepared.  After blood pressure readings were appropriate and the heparin had been given time to circulate, the internal carotid artery was occluded with a baby Gregory clamp.  The external and common carotid arteries were then occluded with vascular clamps and the 2-0 tie tightened on the superior thyroid artery.  A #11 blade was used to make an arteriotomy in the common carotid artery.  This was extended with Potts scissors along the anterior and lateral border of the common and internal carotid artery.  Approximately 90% stenosis was identified.  There was no thrombus identified.  The shunt was not placed because of the small size of the distal artery and pulsatile back bleeding.  A kleiner kuntz elevator was used to perform endarterectomy.  An eversion endarterectomy was performed in the external carotid artery.  A good distal endpoint was obtained in the internal carotid artery.  The specimen was removed and sent to pathology.  Heparinized saline was used to irrigate the endarterectomized field.  All potential embolic debris was removed.  Bovine pericardial patch angioplasty was then performed using a running 6-0  The common internal and external carotid arteries were all appropriately flushed. The artery was again irrigated with heparin saline.  The anastomosis was then secured. The clamp was first  released on the external carotid artery followed by the common carotid artery approximately 30 seconds later, bloodflow was reestablish through the internal carotid artery.  Next, a hand-held  Doppler was used to evaluate the signals in the common, external, and internal  carotid arteries, all of which had  appropriate signals. I then administered  50 mg protamine. The wound was then irrigated.  After hemostasis was achieved, the carotid sheath was reapproximated with 3-0 Vicryl. The  platysma muscle was reapproximated with running 3-0 Vicryl. The skin  was closed with 4-0 Vicryl. Dermabond was placed on the skin. The  patient was then successfully extubated. Her neurologic exam was  similar to his preprocedural exam. The patient was then taken to recovery room  in stable condition. There were no complications.     Disposition:  To PACU in stable condition.  Relevant Operative Details:  90% stenosis at the bifurcation, consistent with ruptured plaque with mobile debris.  The distal artery was small in caliber.  I was reluctant to place a shunt because of the size.  There was however excellent backbleeding and therefore no shunt was required.  Bifurcation was in the mid neck.  Juleen China, M.D. [facility] [place] Office: [phone] Pager:  [phone]

## 2015-08-22 NOTE — Progress Notes (Signed)
S/p L CEA  Sitting up in chair.  Has a sore throat.  Tolerating diet.  Ambulating  Incision is clean without drainage.  No hematoma Neurologically intact.  Smile symmetric.  Tongue Midline  Anticipate d/c tomorrow am Continue ASA/Plavix  Durene Cal

## 2015-08-22 NOTE — Anesthesia Procedure Notes (Signed)
Procedure Name: Intubation Date/Time: 08/22/2015 7:40 AM Performed by: Carmela Rima Pre-anesthesia Checklist: Timeout performed, Patient being monitored, Suction available, Emergency Drugs available and Patient identified Patient Re-evaluated:Patient Re-evaluated prior to inductionOxygen Delivery Method: Circle system utilized Preoxygenation: Pre-oxygenation with 100% oxygen Intubation Type: IV induction Ventilation: Mask ventilation without difficulty Laryngoscope Size: Mac and 3 Grade View: Grade I Tube type: Oral Tube size: 7.0 mm Number of attempts: 1 Placement Confirmation: breath sounds checked- equal and bilateral,  positive ETCO2 and ETT inserted through vocal cords under direct vision Secured at: 22 cm Tube secured with: Tape Dental Injury: Teeth and Oropharynx as per pre-operative assessment

## 2015-08-22 NOTE — Progress Notes (Signed)
repoprt given to [employer] rn as Social worker

## 2015-08-22 NOTE — Transfer of Care (Signed)
Immediate Anesthesia Transfer of Care Note  Patient: [place]  Procedure(s) Performed: Procedure(s): ENDARTERECTOMY LEFT CAROTID ARTERY (Left) PATCH ANGIOPLASTY LEFT CAROTID ARTERY USING XENOSURE BIOLOGIC PATCH (Left)  Patient Location: PACU  Anesthesia Type:General  Level of Consciousness: awake, alert  and oriented  Airway & Oxygen Therapy: Patient Spontanous Breathing and Patient connected to nasal cannula oxygen  Post-op Assessment: Report given to RN, Post -op Vital signs reviewed and stable and Patient moving all extremities X 4  Post vital signs: Reviewed and stable  Last Vitals:  Filed Vitals:   08/22/15 0549  BP: 119/57  Pulse: 48  Temp: 36.8 C  Resp: 20    Last Pain: There were no vitals filed for this visit.    Patients Stated Pain Goal: 3 (08/22/15 0630)  Complications: No apparent anesthesia complications

## 2015-08-22 NOTE — Anesthesia Preprocedure Evaluation (Addendum)
Anesthesia Evaluation  Patient identified by MRN, date of birth, ID band Patient awake    Reviewed: Allergy & Precautions, NPO status , Patient's Chart, lab work & pertinent test results, reviewed documented beta blocker date and time   History of Anesthesia Complications (+) PONV and history of anesthetic complications  Airway Mallampati: II  TM Distance: >3 FB Neck ROM: Full    Dental no notable dental hx. (+) Dental Advidsory Given, Edentulous Upper, Edentulous Lower, Upper Dentures, Lower Dentures   Pulmonary neg pulmonary ROS,    Pulmonary exam normal breath sounds clear to auscultation       Cardiovascular hypertension, Pt. on medications and Pt. on home beta blockers + Peripheral Vascular Disease  Normal cardiovascular exam+ Valvular Problems/Murmurs AS  Rhythm:Regular Rate:Normal + Systolic murmurs Echo  - Left ventricle: The cavity size was normal. There was mild concentric hypertrophy. Systolic function was normal. The estimated ejection fraction was in the range of 60% to 65%. Wall motion was normal; there were no regional wall motion abnormalities. Doppler parameters are consistent with abnormal left ventricular relaxation (grade 1 diastolic dysfunction). Doppler parameters are consistent with high ventricular filling pressure. - Aortic valve: Valve mobility was restricted. There was mild stenosis. Peak velocity (S): 277 cm/s. Mean gradient (S): 11 mm Hg. - Mitral valve: Moderately calcified annulus. - Right atrium: The atrium was mildly dilated.  Impressions: - No significant change in aortic stenosis.   Neuro/Psych  Headaches, negative psych ROS   GI/Hepatic Neg liver ROS, GERD  ,  Endo/Other  diabetes, Type 2, Oral Hypoglycemic Agents  Renal/GU Renal disease     Musculoskeletal negative musculoskeletal ROS (+)   Abdominal   Peds  Hematology  (+) anemia ,   Anesthesia Other Findings   Reproductive/Obstetrics negative OB ROS                           Anesthesia Physical Anesthesia Plan  ASA: III  Anesthesia Plan: General   Post-op Pain Management:    Induction: Intravenous  Airway Management Planned: Oral ETT  Additional Equipment: Arterial line  Intra-op Plan:   Post-operative Plan: Extubation in OR  Informed Consent: I have reviewed the patients History and Physical, chart, labs and discussed the procedure including the risks, benefits and alternatives for the proposed anesthesia with the patient or authorized representative who has indicated his/her understanding and acceptance.   Dental advisory given and Dental Advisory Given  Plan Discussed with: CRNA, Anesthesiologist and Surgeon  Anesthesia Plan Comments:        Anesthesia Quick Evaluation

## 2015-08-22 NOTE — Telephone Encounter (Signed)
-----   Message from Phillips Odor, RN sent at 08/22/2015 12:08 PM EDT ----- Regarding: needs 2 week f/u with Dr Myra Gianotti    ----- Message -----    From: Lars Mage, PA-C    Sent: 08/22/2015  10:04 AM      To: [facility] Charge Pool  F/U with Brabham left CEA 2 weeks

## 2015-08-22 NOTE — Interval H&P Note (Signed)
History and Physical Interval Note:  08/22/2015 7:25 AM  Paula [name]  has presented today for surgery, with the diagnosis of Left carotid artery stenosis I65.22  The various methods of treatment have been discussed with the patient and family. After consideration of risks, benefits and other options for treatment, the patient has consented to  Procedure(s): ENDARTERECTOMY CAROTID (Left) as a surgical intervention .  The patient's history has been reviewed, patient examined, no change in status, stable for surgery.  I have reviewed the patient's chart and labs.  Questions were answered to the patient's satisfaction.     Brabham, Wells  No interval change Cardiac clearance ok Plan L CEA  Wells Brabham

## 2015-08-22 NOTE — Interval H&P Note (Signed)
History and Physical Interval Note:  08/22/2015 7:25 AM  Paula [name]  has presented today for surgery, with the diagnosis of Left carotid artery stenosis I65.22  The various methods of treatment have been discussed with the patient and family. After consideration of risks, benefits and other options for treatment, the patient has consented to  Procedure(s): ENDARTERECTOMY CAROTID (Left) as a surgical intervention .  The patient's history has been reviewed, patient examined, no change in status, stable for surgery.  I have reviewed the patient's chart and labs.  Questions were answered to the patient's satisfaction.     Durene Cal

## 2015-08-22 NOTE — Progress Notes (Signed)
        Alert and oriented Incisions clean and dry without frank hematoma No tongue deviation and smile is symmetric  S/P left CEA Disposition stable  Kalisi Bevill MAUREEN PA-C

## 2015-08-23 LAB — CBC
HEMATOCRIT: 34.3 % — AB (ref 36.0–46.0)
Hemoglobin: 10.5 g/dL — ABNORMAL LOW (ref 12.0–15.0)
MCH: 25.5 pg — AB (ref 26.0–34.0)
MCHC: 30.6 g/dL (ref 30.0–36.0)
MCV: 83.3 fL (ref 78.0–100.0)
Platelets: 129 10*3/uL — ABNORMAL LOW (ref 150–400)
RBC: 4.12 MIL/uL (ref 3.87–5.11)
RDW: 14.5 % (ref 11.5–15.5)
WBC: 5.7 10*3/uL (ref 4.0–10.5)

## 2015-08-23 LAB — BASIC METABOLIC PANEL
Anion gap: 8 (ref 5–15)
BUN: 11 mg/dL (ref 6–20)
CALCIUM: 8.8 mg/dL — AB (ref 8.9–10.3)
CO2: 23 mmol/L (ref 22–32)
CREATININE: 0.8 mg/dL (ref 0.44–1.00)
Chloride: 107 mmol/L (ref 101–111)
GFR calc Af Amer: >60 mL/min (ref >60)
GFR calc non Af Amer: >60 mL/min (ref >60)
GLUCOSE: 154 mg/dL — AB (ref 65–99)
Potassium: 4 mmol/L (ref 3.5–5.1)
Sodium: 138 mmol/L (ref 135–145)

## 2015-08-23 LAB — HEMOGLOBIN A1C
Hgb A1c MFr Bld: 6.6 % — ABNORMAL HIGH (ref 4.8–5.6)
MEAN PLASMA GLUCOSE: 143 mg/dL

## 2015-08-23 LAB — GLUCOSE, CAPILLARY: Glucose-Capillary: 130 mg/dL — ABNORMAL HIGH (ref 65–99)

## 2015-08-23 MED ORDER — ACETAMINOPHEN 325 MG PO TABS: 325.0000 mg | ORAL_TABLET | Freq: Four times a day (QID) | ORAL | Status: AC | PRN

## 2015-08-23 NOTE — Progress Notes (Addendum)
  Progress Note    08/23/2015 8:01 AM 1 Day Post-Op  Subjective:  Looks great sitting in chair.  Denies trouble swallowing.  Mild soreness, but no pain-controlled with Tylenol.  Has not walked yet.  afebrile HR 80's-100's NSR 80's-120's systolic 97% RA  Filed Vitals:   08/23/15 0408 08/23/15 0718  BP: 98/54 118/75  Pulse: 85 86  Temp: 98.3 F (36.8 C) 98.1 F (36.7 C)  Resp: 10 13     Physical Exam: Neuro:  In tact; tongue is midline; moving all extremities equally; no difficulty swallowing Lungs:  Non labored Incision:  Clean and dry with mild ecchymosis  CBC    Component Value Date/Time   WBC 5.7 08/23/2015 0403   RBC 4.12 08/23/2015 0403   HGB 10.5* 08/23/2015 0403   HCT 34.3* 08/23/2015 0403   PLT 129* 08/23/2015 0403   MCV 83.3 08/23/2015 0403   MCH 25.5* 08/23/2015 0403   MCHC 30.6 08/23/2015 0403   RDW 14.5 08/23/2015 0403   LYMPHSABS 1.5 09/13/2008 0755   MONOABS 0.3 09/13/2008 0755   EOSABS 0.1 09/13/2008 0755   BASOSABS 0.0 09/13/2008 0755    BMET    Component Value Date/Time   NA 138 08/23/2015 0403   K 4.0 08/23/2015 0403   CL 107 08/23/2015 0403   CO2 23 08/23/2015 0403   GLUCOSE 154* 08/23/2015 0403   BUN 11 08/23/2015 0403   CREATININE 0.80 08/23/2015 0403   CALCIUM 8.8* 08/23/2015 0403   GFRNONAA >60 08/23/2015 0403   GFRAA >60 08/23/2015 0403     Intake/Output Summary (Last 24 hours) at 08/23/15 0801 Last data filed at 08/23/15 1829  Gross per 24 hour  Intake 4689.17 ml  Output   3025 ml  Net 1664.17 ml     Assessment/Plan:  This is a 70 y.o. female who is s/p left CEA 1 Day Post-Op  -pt is doing well this am. -pt neuro exam is in tact -pt has not ambulated-needs to walk. -pt has voided -f/u with Dr. Myra Gianotti in 2 weeks. -pain is controlled with Tylenol. -discharge after pt has ambulated and eaten breakfast.   Paula Massed, PA-C [facility] [phone] I have examined the patient, reviewed and  agree with above.  Paula Began, MD 08/23/2015 10:38 AM

## 2015-08-23 NOTE — Discharge Summary (Signed)
Discharge Summary     Paula [name] 10-26-1945 70 y.o. female  [id number]  Admission Date: 08/22/2015  Discharge Date: 08/23/15  Physician: Nada Libman, MD  Admission Diagnosis: Left carotid artery stenosis I65.22   HPI:   This is a 70 y.o. female patient comes back today for followup. She is well-known to our office. She is status post right femoral to anterior tibial bypass graft by Dr. Madilyn Fireman as well as a left femoral to below knee popliteal artery bypass graft by Dr. Madilyn Fireman. These were both done for limb salvage. She recently was found to have a stenosis potentially on the left leg and underwent arteriogram in December. I was unable to detect any hemodynamically significant lesions  The patient was recently scheduled for a carotid duplex secondary to a bruit. This identified 40-59% right carotid stenosis and greater than 80% left carotid stenosis. The patient has a history of hemo-plegic migraines for many years. Generally, will happen is that she gets numbness and tingling in her right arm and right leg. She will take an aspirin and this will usually hold off the headache. Her symptoms are in the right arm and right leg as well as left sided headaches.  Hospital Course:  The patient was admitted to the hospital and taken to the operating room on 08/22/2015 and underwent left carotid endarterectomy.  The pt tolerated the procedure well and was transported to the PACU in good condition.   By POD 1, the pt neuro status was in tact.  Her tongue was midline and she is moving all extremities equally.    The remainder of the hospital course consisted of increasing mobilization and increasing intake of solids without difficulty.    Recent Labs  08/23/15 0403  NA 138  K 4.0  CL 107  CO2 23  GLUCOSE 154*  BUN 11  CALCIUM 8.8*    Recent Labs  08/22/15 1507 08/23/15 0403  WBC 4.7 5.7  HGB 10.7* 10.5*  HCT 34.6* 34.3*  PLT 125* 129*   No results for input(s):  INR in the last 72 hours.   Discharge Instructions    CAROTID Sugery: Call MD for difficulty swallowing or speaking; weakness in arms or legs that is a new symtom; severe headache.  If you have increased swelling in the neck and/or  are having difficulty breathing, CALL [phone]    Complete by:  As directed      Call MD for:  redness, tenderness, or signs of infection (pain, swelling, bleeding, redness, odor or green/yellow discharge around incision site)    Complete by:  As directed      Call MD for:  severe or increased pain, loss or decreased feeling  in affected limb(s)    Complete by:  As directed      Call MD for:  temperature >100.5    Complete by:  As directed      Discharge instructions    Complete by:  As directed   You may shower in 24 hours     Driving Restrictions    Complete by:  As directed   No driving for 1 week     Lifting restrictions    Complete by:  As directed   No heavy lifting for 4 weeks     Resume previous diet    Complete by:  As directed            Discharge Diagnosis:  Left carotid artery stenosis I65.22  Secondary Diagnosis: Patient Active  Problem List   Diagnosis Date Noted  . Carotid stenosis 08/22/2015  . PAD (peripheral artery disease) 05/11/2013  . PVD (peripheral vascular disease) 05/11/2013  . Acute renal failure (ARF) (HCC) 04/23/2013  . Type II or unspecified type diabetes mellitus with peripheral circulatory disorders, uncontrolled(250.72) 04/23/2013  . Hypotension, unspecified 04/23/2013  . Nausea with vomiting 04/23/2013  . Diarrhea 04/23/2013  . Dehydration 04/23/2013  . Numbness or tingling-Feet 11/07/2012  . Pain in limb-Bilateral leg 11/07/2012  . Aftercare following surgery of the circulatory system, NEC 11/07/2012  . Peripheral vascular disease, unspecified (HCC) 11/07/2012  . Atherosclerosis of native arteries of the extremities with intermittent claudication 05/24/2011   Past Medical History  Diagnosis Date  . Hypertension    . Diabetes mellitus   . Hyperlipidemia   . PVD (peripheral vascular disease) (HCC)   . GERD (gastroesophageal reflux disease)   . Anemia   . Varicose veins   . PONV (postoperative nausea and vomiting)     "no problems since 1989"  . Heart murmur     "mild"  . Headache     migraines  . Aortic stenosis     mild AS 04/2015 echo      Medication List    TAKE these medications        acetaminophen 325 MG tablet  Commonly known as:  TYLENOL  Take 1-2 tablets (325-650 mg total) by mouth every 6 (six) hours as needed.     aspirin EC 325 MG tablet  Take 325 mg by mouth daily.     clopidogrel 75 MG tablet  Commonly known as:  PLAVIX  Take 75 mg by mouth daily.     ezetimibe-simvastatin 10-40 MG tablet  Commonly known as:  VYTORIN  Take 1 tablet by mouth daily.     ketoconazole 2 % cream  Commonly known as:  NIZORAL  Apply 1 application topically daily as needed for irritation (under the breast for yeast).     meloxicam 15 MG tablet  Commonly known as:  MOBIC  Take 15 mg by mouth daily.     metFORMIN 500 MG tablet  Commonly known as:  GLUCOPHAGE  Take 500-1,000 mg by mouth 2 (two) times daily with a meal. Take 2 Tabs in the morning and one at night     metoprolol 50 MG tablet  Commonly known as:  LOPRESSOR  Take 50 mg by mouth 2 (two) times daily.     olmesartan-hydrochlorothiazide 20-12.5 MG tablet  Commonly known as:  BENICAR HCT  Take 1 tablet by mouth daily.     omeprazole 20 MG capsule  Commonly known as:  PRILOSEC  Take 20 mg by mouth daily.     traMADol 50 MG tablet  Commonly known as:  ULTRAM  Take 1 tablet (50 mg total) by mouth at bedtime as needed for moderate pain.        Prescriptions given: None given-will use Tylenol  Instructions: 1.  No driving for 2 weeks. 2.  No heavy lifting for 2 weeks.   Disposition: home  Patient's condition: is Good  Follow up: 1. Dr. Myra Gianotti in 2 weeks.   [name], PA-C [facility] [phone]  --- For [facility] use --- Instructions: Press F2 to tab through selections.  Delete question if not applicable.   Modified Rankin score at D/C (0-6): 0  IV medication needed for:  1. Hypertension: No 2. Hypotension: No  Post-op Complications: No  1. Post-op CVA or TIA:  No  If yes: Event classification (right eye, left eye, right cortical, left cortical, verterobasilar, other): n/a  If yes: Timing of event (intra-op, <6 hrs post-op, >=6 hrs post-op, unknown): n/a  2. CN injury: No  If yes: CN n/a injuried   3. Myocardial infarction: No  If yes: Dx by (EKG or clinical, Troponin): n/a  4.  CHF: No  5.  Dysrhythmia (new): No  6. Wound infection: No  7. Reperfusion symptoms: No  8. Return to OR: No  If yes: return to OR for (bleeding, neurologic, other CEA incision, other): n/a  Discharge medications: Statin use:  Yes If No: [ ]  For Medical reasons, [ ]  Non-compliant, [ ]  Not-indicated ASA use:  Yes  If No: [ ]  For Medical reasons, [ ]  Non-compliant, [ ]  Not-indicated Beta blocker use:  Yes If No: [ ]  For Medical reasons, [ ]  Non-compliant, [ ]  Not-indicated ACE-Inhibitor use:  No If No: [ ]  For Medical reasons, [ ]  Non-compliant, [ ]  Not-indicated ARB use:  No P2Y12 Antagonist use: Yes, [x ] Plavix, [ ]  Plasugrel, [ ]  Ticlopinine, [ ]  Ticagrelor, [ ]  Other, [ ]  No for medical reason, [ ]  Non-compliant, [ ]  Not-indicated Anti-coagulant use:  No, [ ]  Warfarin, [ ]  Rivaroxaban, [ ]  Dabigatran, [ ]  Other, [ ]  No for medical reason, [ ]  Non-compliant, [ ]  Not-indicated

## 2015-08-23 NOTE — Progress Notes (Signed)
Explained and discussed discharge instructions, prescription and follow up appt. Given., care note on after care of carotid endarectomy  Discussed and given to patient. Going home via w/c with husband with belongings.

## 2015-08-23 NOTE — Anesthesia Postprocedure Evaluation (Signed)
Anesthesia Post Note  Patient: Paula [name]  Procedure(s) Performed: Procedure(s) (LRB): ENDARTERECTOMY LEFT CAROTID ARTERY (Left) PATCH ANGIOPLASTY LEFT CAROTID ARTERY USING XENOSURE BIOLOGIC PATCH (Left)  Patient location during evaluation: PACU Anesthesia Type: General Level of consciousness: sedated and patient cooperative Pain management: pain level controlled Vital Signs Assessment: post-procedure vital signs reviewed and stable Respiratory status: spontaneous breathing Cardiovascular status: stable Anesthetic complications: no    Last Vitals:  Filed Vitals:   08/23/15 0408 08/23/15 0718  BP: 98/54 118/75  Pulse: 85 86  Temp: 36.8 C 36.7 C  Resp: 10 13    Last Pain:  Filed Vitals:   08/23/15 0725  PainSc: 0-No pain                 Lewie Loron

## 2015-08-25 ENCOUNTER — Encounter (HOSPITAL_COMMUNITY): Payer: Self-pay | Admitting: Surgery

## 2015-08-25 MED FILL — CLOPIDOGREL 75 MG TABLET: 75 | 90 days supply | Qty: 90 | Fill #0

## 2015-08-25 MED FILL — MELOXICAM 15 MG TABLET: 15 | 30 days supply | Qty: 30 | Fill #1

## 2015-08-25 MED FILL — KETOCONAZOLE 2% CREAM: 2 | 15 days supply | Qty: 30 | Fill #1

## 2015-09-04 ENCOUNTER — Encounter: Payer: Self-pay | Admitting: Surgery

## 2015-09-10 ENCOUNTER — Ambulatory Visit (INDEPENDENT_AMBULATORY_CARE_PROVIDER_SITE_OTHER): Payer: Self-pay | Admitting: Surgery

## 2015-09-10 ENCOUNTER — Encounter: Payer: Self-pay | Admitting: Surgery

## 2015-09-10 VITALS — BP 140/81 | HR 72 | Temp 98.0°F | Resp 14 | Ht 64.0 in | Wt 190.0 lb

## 2015-09-10 DIAGNOSIS — I6522 Occlusion and stenosis of left carotid artery: Secondary | ICD-10-CM

## 2015-09-10 NOTE — Progress Notes (Signed)
Patient name: Paula [name] MRN: [id number] DOB: 1945-09-20 Sex: female  REASON FOR VISIT: follow up  HPI: [name] is a 70 y.o. female .  She recently had progression of her disease to greater than 80%.  She also reports episodic numbness and tingling in her right arm.  She had previously attributed this to migraine headaches.  On 08/22/2015 she underwent left carotid endarterectomy with bovine pericardial patch angioplasty.  Intraoperative findings included a 90% stenosis consistent with ruptured plaque and mobile debris.  Her postoperative course was uncomplicated.  She is here today for follow-up.  She states that she has not had any episodes of right-sided numbness and weakness since the operation.  Prior to the operation, she was having 2-3 per week.  She reports no neurologic symptoms.  She does complain of some thickening at the incision site.  Current Outpatient Prescriptions  Medication Sig Dispense Refill  . acetaminophen (TYLENOL) 325 MG tablet Take 1-2 tablets (325-650 mg total) by mouth every 6 (six) hours as needed.    Marland Kitchen aspirin EC 325 MG tablet Take 325 mg by mouth daily.      . clopidogrel (PLAVIX) 75 MG tablet Take 75 mg by mouth daily.      Marland Kitchen ezetimibe-simvastatin (VYTORIN) 10-40 MG per tablet Take 1 tablet by mouth daily. 30 tablet 11  . ketoconazole (NIZORAL) 2 % cream Apply 1 application topically daily as needed for irritation (under the breast for yeast).    . meloxicam (MOBIC) 15 MG tablet Take 15 mg by mouth daily.    . metFORMIN (GLUCOPHAGE) 500 MG tablet Take 500-1,000 mg by mouth 2 (two) times daily with a meal. Take 2 Tabs in the morning and one at night    . metoprolol (LOPRESSOR) 50 MG tablet Take 50 mg by mouth 2 (two) times daily.    Marland Kitchen olmesartan-hydrochlorothiazide (BENICAR HCT) 20-12.5 MG per tablet Take 1 tablet by mouth daily.    Marland Kitchen omeprazole (PRILOSEC) 20 MG capsule Take 20 mg by mouth daily.      .  traMADol (ULTRAM) 50 MG tablet Take 1 tablet (50 mg total) by mouth at bedtime as needed for moderate pain. 12 tablet 0   No current facility-administered medications for this visit.     REVIEW OF SYSTEMS:  [X]  denotes positive finding, [ ]  denotes negative finding Cardiac  Comments:  Chest pain or chest pressure:    Shortness of breath upon exertion:    Short of breath when lying flat:    Irregular heart rhythm:    Constitutional    Fever or chills:      PHYSICAL EXAM: Vitals:   09/10/15 0910 09/10/15 0913 09/10/15 0917  BP: 138/80 (!) 158/79 140/81  Pulse:  72 72  Resp: 14    Temp: 98 F (36.7 C)    SpO2: 98%    Weight: 190 lb (86.2 kg)    Height: 5\' 4"  (1.626 m)      GENERAL: The patient is a well-nourished female, in no acute distress. The vital signs are documented above. CARDIOVASCULAR: There is a regular rate and rhythm. PULMONARY: There is good air exchange bilaterally without wheezing or rales. Incision is healing nicely Neurologically intact  MEDICAL ISSUES: She is doing very well, status post left carotid endarterectomy.  It appears that her right-sided symptoms were related to her carotid stenosis, as she has not had any new symptoms since her surgery.  I have her scheduled for follow-up in December with carotid  studies as well as lower extremity ABI and duplex.  Durene Cal, MD [facility] [phone] Pager [phone]

## 2015-09-10 NOTE — Progress Notes (Signed)
Vitals:   09/10/15 0910 09/10/15 0913 09/10/15 0917  BP: 138/80 (!) 158/79 140/81  Pulse:  72 72  Resp: 14    Temp: 98 F (36.7 C)    SpO2: 98%    Weight: 190 lb (86.2 kg)    Height: 5\' 4"  (1.626 m)

## 2015-09-12 ENCOUNTER — Emergency Department (HOSPITAL_BASED_OUTPATIENT_CLINIC_OR_DEPARTMENT_OTHER)
Admission: EM | Admit: 2015-09-12 | Discharge: 2015-09-12 | Disposition: A | Discharge status: Home / Self Care | Payer: 59 | Attending: Emergency Medicine | Admitting: Emergency Medicine

## 2015-09-12 ENCOUNTER — Encounter (HOSPITAL_BASED_OUTPATIENT_CLINIC_OR_DEPARTMENT_OTHER): Payer: Self-pay | Admitting: *Deleted

## 2015-09-12 DIAGNOSIS — E119 Type 2 diabetes mellitus without complications: Secondary | ICD-10-CM | POA: Diagnosis not present

## 2015-09-12 DIAGNOSIS — I739 Peripheral vascular disease, unspecified: Secondary | ICD-10-CM | POA: Diagnosis not present

## 2015-09-12 DIAGNOSIS — T814XXA Infection following a procedure, initial encounter: Secondary | ICD-10-CM | POA: Diagnosis not present

## 2015-09-12 DIAGNOSIS — Y828 Other medical devices associated with adverse incidents: Secondary | ICD-10-CM | POA: Diagnosis not present

## 2015-09-12 DIAGNOSIS — R509 Fever, unspecified: Secondary | ICD-10-CM | POA: Diagnosis present

## 2015-09-12 DIAGNOSIS — E785 Hyperlipidemia, unspecified: Secondary | ICD-10-CM | POA: Diagnosis not present

## 2015-09-12 DIAGNOSIS — Z7984 Long term (current) use of oral hypoglycemic drugs: Secondary | ICD-10-CM | POA: Insufficient documentation

## 2015-09-12 DIAGNOSIS — IMO0001 Reserved for inherently not codable concepts without codable children: Secondary | ICD-10-CM

## 2015-09-12 DIAGNOSIS — Z7982 Long term (current) use of aspirin: Secondary | ICD-10-CM | POA: Insufficient documentation

## 2015-09-12 DIAGNOSIS — I10 Essential (primary) hypertension: Secondary | ICD-10-CM | POA: Diagnosis not present

## 2015-09-12 LAB — CBC WITH DIFFERENTIAL/PLATELET
BASOS ABS: 0 10*3/uL (ref 0.0–0.1)
Basophils Relative: 1 %
Eosinophils Absolute: 0.1 10*3/uL (ref 0.0–0.7)
Eosinophils Relative: 2 %
HEMATOCRIT: 33.6 % — AB (ref 36.0–46.0)
HEMOGLOBIN: 10.5 g/dL — AB (ref 12.0–15.0)
LYMPHS ABS: 1.8 10*3/uL (ref 0.7–4.0)
LYMPHS PCT: 28 %
MCH: 25.8 pg — AB (ref 26.0–34.0)
MCHC: 31.3 g/dL (ref 30.0–36.0)
MCV: 82.6 fL (ref 78.0–100.0)
Monocytes Absolute: 0.7 10*3/uL (ref 0.1–1.0)
Monocytes Relative: 11 %
NEUTROS ABS: 3.9 10*3/uL (ref 1.7–7.7)
NEUTROS PCT: 58 %
PLATELETS: 124 10*3/uL — AB (ref 150–400)
RBC: 4.07 MIL/uL (ref 3.87–5.11)
RDW: 14.9 % (ref 11.5–15.5)
WBC: 6.5 10*3/uL (ref 4.0–10.5)

## 2015-09-12 LAB — BASIC METABOLIC PANEL
ANION GAP: 8 (ref 5–15)
BUN: 19 mg/dL (ref 6–20)
CHLORIDE: 102 mmol/L (ref 101–111)
CO2: 29 mmol/L (ref 22–32)
Calcium: 8.5 mg/dL — ABNORMAL LOW (ref 8.9–10.3)
Creatinine, Ser: 1.02 mg/dL — ABNORMAL HIGH (ref 0.44–1.00)
GFR calc Af Amer: >60 mL/min (ref >60)
GFR, EST NON AFRICAN AMERICAN: 55 mL/min — AB (ref >60)
GLUCOSE: 137 mg/dL — AB (ref 65–99)
POTASSIUM: 3.5 mmol/L (ref 3.5–5.1)
Sodium: 139 mmol/L (ref 135–145)

## 2015-09-12 LAB — I-STAT CG4 LACTIC ACID, ED: Lactic Acid, Venous: 1.37 mmol/L (ref 0.5–1.9)

## 2015-09-12 MED ORDER — CEPHALEXIN 500 MG PO CAPS: 500.0000 mg | ORAL_CAPSULE | Freq: Four times a day (QID) | ORAL | 0 refills | Status: DC

## 2015-09-12 MED ORDER — CEPHALEXIN 250 MG PO CAPS
500.0000 mg | ORAL_CAPSULE | Freq: Once | ORAL | Status: AC
Start: 2015-09-12 — End: 2015-09-12
  Administered 2015-09-12: 500 mg via ORAL
  Filled 2015-09-12: qty 2

## 2015-09-12 NOTE — ED Provider Notes (Addendum)
[facility] Provider Note   CSN: [id number] Arrival date & time: 09/12/15  2059  By signing my name below, I, [name], attest that this documentation has been prepared under the direction and in the presence of Paula Crigler, PA-C. Electronically Signed: [name], ED Scribe. 09/12/15. 9:54 PM.  First MD Initiated Contact with Patient 09/12/15 2147     History   Chief Complaint Chief Complaint  Patient presents with  . Fever   The history is provided by the patient. No language interpreter was used.   HPI Comments: Paula [name] is a 70 y.o. female who presents to the Emergency Department complaining of gradual onset fever onset today. Pt had a carotid endarterectomy on 09/01/15 and followed up 2 days ago with Dr. Myra Gianotti. Pt states she felt she had chills and took her temperature. Pt states she had a fever and noticed she had increased swelling around the area of her endarterectomy incisional wound. Pt reports she took Tylenol which helped with her fever. Pt states she felt her neck was wet and she noticed she had a scant amount of purulent drainage coming from the area of her operation. Pt reports her neck is tender to palpation.   Past Medical History:  Diagnosis Date  . Anemia   . Aortic stenosis    mild AS 04/2015 echo  . Diabetes mellitus   . GERD (gastroesophageal reflux disease)   . Headache    migraines  . Heart murmur    "mild"  . Hyperlipidemia   . Hypertension   . PONV (postoperative nausea and vomiting)    "no problems since 1989"  . PVD (peripheral vascular disease) (HCC)   . Varicose veins     Patient Active Problem List   Diagnosis Date Noted  . Carotid stenosis 08/22/2015  . PAD (peripheral artery disease) 05/11/2013  . PVD (peripheral vascular disease) 05/11/2013  . Acute renal failure (ARF) (HCC) 04/23/2013  . Type II or unspecified type diabetes mellitus with peripheral circulatory disorders, uncontrolled(250.72) 04/23/2013   . Hypotension, unspecified 04/23/2013  . Nausea with vomiting 04/23/2013  . Diarrhea 04/23/2013  . Dehydration 04/23/2013  . Numbness or tingling-Feet 11/07/2012  . Pain in limb-Bilateral leg 11/07/2012  . Aftercare following surgery of the circulatory system, NEC 11/07/2012  . Peripheral vascular disease, unspecified (HCC) 11/07/2012  . Atherosclerosis of native arteries of the extremities with intermittent claudication 05/24/2011    Past Surgical History:  Procedure Laterality Date  . ABDOMINAL AORTAGRAM N/A 01/20/2011   Procedure: ABDOMINAL Ronny Flurry;  Surgeon: Nada Libman, MD;  Location: [facility] CATH LAB;  Service: Cardiovascular;  Laterality: N/A;  . ADENOIDECTOMY    . Anterior tibial BPG Right 2010   Fem-to- anterior Tibial  . CARPAL TUNNEL RELEASE Bilateral   . COLON RESECTION    . COLONOSCOPY    . ENDARTERECTOMY Left 08/22/2015   Procedure: ENDARTERECTOMY LEFT CAROTID ARTERY;  Surgeon: Nada Libman, MD;  Location: [facility] OR;  Service: Vascular;  Laterality: Left;  . femerol popliteal Left Jan 2007  . FEMORAL BYPASS Left 2005  . PATCH ANGIOPLASTY Left 08/22/2015   Procedure: PATCH ANGIOPLASTY LEFT CAROTID ARTERY USING Livia Snellen BIOLOGIC PATCH;  Surgeon: Nada Libman, MD;  Location: [facility] OR;  Service: Vascular;  Laterality: Left;  . TARSAL TUNNEL RELEASE    . TONSILLECTOMY    . TUBAL LIGATION    . Vein patch Left May 2007   Left leg vein patch     OB History  No data available       Home Medications    Prior to Admission medications   Medication Sig Start Date End Date Taking? Authorizing Provider  acetaminophen (TYLENOL) 325 MG tablet Take 1-2 tablets (325-650 mg total) by mouth every 6 (six) hours as needed. 08/23/15   Samantha J Rhyne, PA-C  aspirin EC 325 MG tablet Take 325 mg by mouth daily.      Historical Provider, MD  clopidogrel (PLAVIX) 75 MG tablet Take 75 mg by mouth daily.      Historical Provider, MD  ezetimibe-simvastatin (VYTORIN) 10-40 MG per tablet  Take 1 tablet by mouth daily. 04/25/13   Creola Corn, MD  ketoconazole (NIZORAL) 2 % cream Apply 1 application topically daily as needed for irritation (under the breast for yeast).    Historical Provider, MD  meloxicam (MOBIC) 15 MG tablet Take 15 mg by mouth daily.    Historical Provider, MD  metFORMIN (GLUCOPHAGE) 500 MG tablet Take 500-1,000 mg by mouth 2 (two) times daily with a meal. Take 2 Tabs in the morning and one at night    Historical Provider, MD  metoprolol (LOPRESSOR) 50 MG tablet Take 50 mg by mouth 2 (two) times daily.    Historical Provider, MD  olmesartan-hydrochlorothiazide (BENICAR HCT) 20-12.5 MG per tablet Take 1 tablet by mouth daily.    Historical Provider, MD  omeprazole (PRILOSEC) 20 MG capsule Take 20 mg by mouth daily.      Historical Provider, MD  traMADol (ULTRAM) 50 MG tablet Take 1 tablet (50 mg total) by mouth at bedtime as needed for moderate pain. 08/22/15   Lars Mage, PA-C    Family History Family History  Problem Relation Age of Onset  . Dementia Mother   . Atrial fibrillation Mother   . Hiatal hernia Mother   . Hyperlipidemia Mother   . Hypertension Mother   . Other Mother     Varicose veins  . Varicose Veins Mother   . Heart disease Father     Before age 30, small MI  . AAA (abdominal aortic aneurysm) Father     Died age 70  . Hyperlipidemia Father     Social History Social History  Substance Use Topics  . Smoking status: Never Smoker  . Smokeless tobacco: Never Used  . Alcohol use Yes     Comment: very rarely    Allergies   Codeine   Review of Systems Review of Systems  Constitutional: Positive for chills and fever.  HENT: Negative for rhinorrhea and sore throat.   Eyes: Negative for redness.  Respiratory: Negative for cough.   Cardiovascular: Negative for chest pain.  Gastrointestinal: Negative for abdominal pain, diarrhea, nausea and vomiting.  Genitourinary: Negative for dysuria.  Musculoskeletal: Positive for myalgias.    Skin: Positive for color change and wound. Negative for rash.  Neurological: Negative for headaches.     Physical Exam Updated Vital Signs BP 131/69   Pulse 101   Temp 99.9 F (37.7 C) (Oral)   Resp 16   Ht 5\' 4"  (1.626 m)   Wt 190 lb (86.2 kg)   SpO2 96%   BMI 32.61 kg/m   Physical Exam  Constitutional: She appears well-developed and well-nourished.  HENT:  Head: Normocephalic and atraumatic.  Eyes: Conjunctivae are normal. Right eye exhibits no discharge. Left eye exhibits no discharge.  Neck: Trachea normal and normal range of motion. Neck supple. No neck rigidity. Edema and erythema present. Normal range of motion present.  Cardiovascular: Normal rate, regular rhythm and normal heart sounds.   Pulmonary/Chest: Effort normal and breath sounds normal.  Abdominal: Soft. There is no tenderness.  Neurological: She is alert.  Skin: Skin is warm and dry.  Psychiatric: She has a normal mood and affect.  Nursing note and vitals reviewed.    ED Treatments / Results  DIAGNOSTIC STUDIES: Oxygen Saturation is 96% on RA, adequate by my interpretation.    COORDINATION OF CARE: 9:48 PM Discussed treatment plan with pt at bedside which includes blood work and pt agreed to plan.   Labs (all labs ordered are listed, but only abnormal results are displayed) Labs Reviewed  CBC WITH DIFFERENTIAL/PLATELET - Abnormal; Notable for the following:       Result Value   Hemoglobin 10.5 (*)    HCT 33.6 (*)    MCH 25.8 (*)    Platelets 124 (*)    All other components within normal limits  BASIC METABOLIC PANEL - Abnormal; Notable for the following:    Glucose, Bld 137 (*)    Creatinine, Ser 1.02 (*)    Calcium 8.5 (*)    GFR calc non Af Amer 55 (*)    All other components within normal limits  I-STAT CG4 LACTIC ACID, ED    EKG  EKG Interpretation None       Radiology No results found.  Procedures Procedures (including critical care time)  Medications Ordered in  ED Medications - No data to display   Initial Impression / Assessment and Plan / ED Course  I have reviewed the triage vital signs and the nursing notes.  Pertinent labs & imaging results that were available during my care of the patient were reviewed by me and considered in my medical decision making (see chart for details).  Clinical Course   Patient discussed with and seen by Dr. Particia Nearing. Patient Appears well and nontoxic. White blood cell count is normal. Lactate is normal. Patient treated with Keflex in emergency department. Will discharge to home on the same.  Had a long discussion with the patient and her husband regarding how to proceed. At this point, we feel that she is appropriate for a trial of outpatient antibiotics. We discussed strict return precautions. She should return with expanding area of redness, worsening pain, persistent or worsening fevers for recheck. Otherwise, if improving will need to follow up with her vascular surgeon for PCP in 2-3 days. Patient and husband both verbalized that they are comfortable with this plan. Informed them that I will personally be here the next two nights to re-examine her if needed. Urged to take complete course of antibiotics as prescribed.   Final Clinical Impressions(s) / ED Diagnoses   Final diagnoses:  Wound infection after surgery, initial encounter    New Prescriptions New Prescriptions   No medications on file   Patient with wound infection, mild at this point. No areas of fluctuance that would require drainage. Patient with low-grade fever. White blood count was normal. Lactate is normal. No active drainage. Patient is 70 years old with controlled diabetes. This area needs to be monitored closely for improvement/worsening. She seems reliable to return if symptoms are not improving or acutely worsening. Discharged home with trial of outpatient antibiotics.    Paula Crigler, PA-C 09/12/15 2325   Pt . Seen and examined.   She does appear to have a wound infection.  She will be placed on abx and d/c'd home with abx. Jacalyn Lefevre, MD 09/13/15 [phone]  Jacalyn Lefevre, MD 09/27/15 [phone]

## 2015-09-12 NOTE — ED Triage Notes (Signed)
She had an endarterectomy on 08/22/15. She is having chills and a fever. She has been taking Tylenol.

## 2015-09-12 NOTE — Discharge Instructions (Signed)
Please read and follow all provided instructions.  Your diagnoses today include:  1. Wound infection after surgery, initial encounter     Tests performed today include: Vital signs. See below for your results today.  Blood counts and electrolytes - normal Lactic acid - normal  Medications prescribed:  Keflex (cephalexin) - antibiotic  You have been prescribed an antibiotic medicine: take the entire course of medicine even if you are feeling better. Stopping early can cause the antibiotic not to work.  Take any prescribed medications only as directed.   Home care instructions:  Follow any educational materials contained in this packet. Keep affected area above the level of your heart when possible. Wash area gently twice a day with warm soapy water. Do not apply alcohol or hydrogen peroxide. Cover the area if it draining or weeping.   Follow-up instructions: Return to the Emergency Department in 48 hours for a recheck if your symptoms are not significantly improved.   Return instructions:  Return to the Emergency Department if you have: Fever Worsening symptoms Worsening pain Worsening swelling Redness of the skin that moves away from the affected area, especially if it streaks away from the affected area  Any other emergent concerns  Your vital signs today were: BP 131/69    Pulse 101    Temp 99.9 F (37.7 C) (Oral)    Resp 16    Ht 5\' 4"  (1.626 m)    Wt 86.2 kg    SpO2 96%    BMI 32.61 kg/m  If your blood pressure (BP) was elevated above 135/85 this visit, please have this repeated by your doctor within one month. --------------

## 2015-09-15 ENCOUNTER — Ambulatory Visit (INDEPENDENT_AMBULATORY_CARE_PROVIDER_SITE_OTHER): Payer: Self-pay | Admitting: Surgery

## 2015-09-15 ENCOUNTER — Telehealth: Payer: Self-pay

## 2015-09-15 ENCOUNTER — Encounter: Payer: Self-pay | Admitting: Surgery

## 2015-09-15 VITALS — BP 142/84 | HR 60 | Temp 98.9°F | Resp 20 | Ht 64.0 in | Wt 193.0 lb

## 2015-09-15 DIAGNOSIS — I6523 Occlusion and stenosis of bilateral carotid arteries: Secondary | ICD-10-CM

## 2015-09-15 DIAGNOSIS — I6522 Occlusion and stenosis of left carotid artery: Secondary | ICD-10-CM

## 2015-09-15 MED ORDER — LEVOFLOXACIN 500 MG PO TABS: 500.0000 mg | ORAL_TABLET | Freq: Every day | ORAL | 0 refills | Status: DC

## 2015-09-15 MED FILL — levoFLOXacin 500 MG TABS: 500 | 10 days supply | Qty: 10 | Fill #0

## 2015-09-15 NOTE — Progress Notes (Signed)
Patient name: Paula [name] MRN: [id number] DOB: 1945/04/10 Sex: female  REASON FOR VISIT: Wound check  HPI: [place] is a 70 y.o. female who is status post left carotid endarterectomy on 08/22/2015 for asymptomatic stenosis.  I saw her last week and she is doing very well.  Her incision was healing nicely.  Unfortunately she recently developed a fever and drainage from her incision.  [facility] went to the [facility] [place] and was started on antibiotics.  Her fevers have gotten better but she still has drainage from her incision  Current Outpatient Prescriptions  Medication Sig Dispense Refill  . acetaminophen (TYLENOL) 325 MG tablet Take 1-2 tablets (325-650 mg total) by mouth every 6 (six) hours as needed.    Marland Kitchen aspirin EC 325 MG tablet Take 325 mg by mouth daily.      . cephALEXin (KEFLEX) 500 MG capsule Take 1 capsule (500 mg total) by mouth 4 (four) times daily. 20 capsule 0  . clopidogrel (PLAVIX) 75 MG tablet Take 75 mg by mouth daily.      Marland Kitchen ezetimibe-simvastatin (VYTORIN) 10-40 MG per tablet Take 1 tablet by mouth daily. 30 tablet 11  . ketoconazole (NIZORAL) 2 % cream Apply 1 application topically daily as needed for irritation (under the breast for yeast).    . meloxicam (MOBIC) 15 MG tablet Take 15 mg by mouth daily.    . metFORMIN (GLUCOPHAGE) 500 MG tablet Take 500-1,000 mg by mouth 2 (two) times daily with a meal. Take 2 Tabs in the morning and one at night    . metoprolol (LOPRESSOR) 50 MG tablet Take 50 mg by mouth 2 (two) times daily.    Marland Kitchen olmesartan-hydrochlorothiazide (BENICAR HCT) 20-12.5 MG per tablet Take 1 tablet by mouth daily.    Marland Kitchen omeprazole (PRILOSEC) 20 MG capsule Take 20 mg by mouth daily.      . traMADol (ULTRAM) 50 MG tablet Take 1 tablet (50 mg total) by mouth at bedtime as needed for moderate pain. 12 tablet 0  . levofloxacin (LEVAQUIN) 500 MG tablet Take 1 tablet (500 mg total) by mouth daily. 10 tablet 0    No current facility-administered medications for this visit.     REVIEW OF SYSTEMS:  [X]  denotes positive finding, [ ]  denotes negative finding Cardiac  Comments:  Chest pain or chest pressure:    Shortness of breath upon exertion:    Short of breath when lying flat:    Irregular heart rhythm:    Constitutional    Fever or chills: x     PHYSICAL EXAM: Vitals:   09/15/15 1131 09/15/15 1135  BP: (!) 142/84 (!) 142/84  Pulse: 60   Resp: 20   Temp: 98.9 F (37.2 C)   TempSrc: Oral   SpO2: 93%   Weight: 193 lb (87.5 kg)   Height: 5\' 4"  (1.626 m)     GENERAL: The patient is a well-nourished female, in no acute distress. The vital signs are documented above. CARDIOVASCULAR: There is a regular rate and rhythm. PULMONARY: There is good air exchange bilaterally without wheezing or rales. I probed her wound with a Q-tip.  There was only 3 small pinpoint openings were there was drainage.  I expressed out what appeared to be either necrotic fat or purulence.  This appeared to be a superficial infection.  MEDICAL ISSUES: Postop infection: The wound was probed today.  This appears to be a superficial process, potentially a stitch abscess which has expanded.  I  did not open the incision but rather probed a Q-tip in 3 separate areas so that I could express the pus.  I'm changing her antibiotics from Keflex to Levaquin to give her brought her coverage.  There are no cultures in the computer.  She will follow-up in one week.  I told her I don't want her going back to work until this has resolved.  I would extend her FMLA for at least 1 week, possibly longer depending on how she looks next week.  Durene Cal, MD [facility] [phone] Pager [phone]

## 2015-09-15 NOTE — Telephone Encounter (Signed)
rec'd phone call from pt. Reporting that [facility] [place] over [facility].  Stated she stared having a fever Thurs. Evening (7/27) of 100.1. Stated on Fri.(7/28), she noticed a bloody and pus-like drainage from left neck incision and fever of 99.4.  [facility].  Reported she was stared on Keflex, and advised to call Dr. Estanislado Spire office on [address].  Also, reported when she pushed around the incision last night, "pus just ran out of the incision."  Discussed with Dr. Myra Gianotti.  Advised to bring pt. to office today for incision check.  Pt. notified, and agreed to an appt. at 11:30 AM today.

## 2015-09-18 ENCOUNTER — Encounter: Payer: Self-pay | Admitting: Surgery

## 2015-09-22 ENCOUNTER — Ambulatory Visit (INDEPENDENT_AMBULATORY_CARE_PROVIDER_SITE_OTHER): Payer: Self-pay | Admitting: Surgery

## 2015-09-22 ENCOUNTER — Encounter: Payer: Self-pay | Admitting: Surgery

## 2015-09-22 VITALS — BP 157/89 | HR 78 | Temp 97.8°F | Ht 64.0 in | Wt 192.3 lb

## 2015-09-22 DIAGNOSIS — I6522 Occlusion and stenosis of left carotid artery: Secondary | ICD-10-CM

## 2015-09-22 DIAGNOSIS — Z0279 Encounter for issue of other medical certificate: Secondary | ICD-10-CM

## 2015-09-22 MED FILL — MELOXICAM 15 MG TABLET: 15 | 30 days supply | Qty: 30 | Fill #2

## 2015-09-22 MED FILL — METOPROLOL TARTRATE 25 MG T: 25 | 90 days supply | Qty: 180 | Fill #3

## 2015-09-22 NOTE — Progress Notes (Signed)
Patient name: Paula [name] MRN: [id number] DOB: [birth year] Sex: female  REASON FOR VISIT: Wound check  HPI: [name] is a 70 y.o. female who is status post left carotid endarterectomy with patch angioplasty on 08/22/2015 for symptomatic left carotid stenosis.  Intraoperative findings included a 90% stenosis.  She was doing well at her two-week follow-up visit however shortly thereafter she developed drainage from her incision.  I saw her in the office and expressed what looked like fat necrosis and some serous drainage.  The wound was probed and it did not track deeply.  I put her on antibiotics.  She comes back today with no significant drainage for 2 days.  Current Outpatient Prescriptions  Medication Sig Dispense Refill  . acetaminophen (TYLENOL) 325 MG tablet Take 1-2 tablets (325-650 mg total) by mouth every 6 (six) hours as needed.    Marland Kitchen aspirin EC 325 MG tablet Take 325 mg by mouth daily.      . cephALEXin (KEFLEX) 500 MG capsule Take 1 capsule (500 mg total) by mouth 4 (four) times daily. 20 capsule 0  . clopidogrel (PLAVIX) 75 MG tablet Take 75 mg by mouth daily.      Marland Kitchen ezetimibe-simvastatin (VYTORIN) 10-40 MG per tablet Take 1 tablet by mouth daily. 30 tablet 11  . ketoconazole (NIZORAL) 2 % cream Apply 1 application topically daily as needed for irritation (under the breast for yeast).    Marland Kitchen levofloxacin (LEVAQUIN) 500 MG tablet Take 1 tablet (500 mg total) by mouth daily. 10 tablet 0  . meloxicam (MOBIC) 15 MG tablet Take 15 mg by mouth daily.    . metFORMIN (GLUCOPHAGE) 500 MG tablet Take 500-1,000 mg by mouth 2 (two) times daily with a meal. Take 2 Tabs in the morning and one at night    . metoprolol (LOPRESSOR) 50 MG tablet Take 50 mg by mouth 2 (two) times daily.    Marland Kitchen olmesartan-hydrochlorothiazide (BENICAR HCT) 20-12.5 MG per tablet Take 1 tablet by mouth daily.    Marland Kitchen omeprazole (PRILOSEC) 20 MG capsule Take 20 mg by mouth  daily.      . traMADol (ULTRAM) 50 MG tablet Take 1 tablet (50 mg total) by mouth at bedtime as needed for moderate pain. 12 tablet 0   No current facility-administered medications for this visit.     REVIEW OF SYSTEMS:   denotes positive finding,  denotes negative finding Cardiac  Comments:  Chest pain or chest pressure:    Shortness of breath upon exertion:    Short of breath when lying flat:    Irregular heart rhythm:    Constitutional    Fever or chills:      PHYSICAL EXAM: Vitals:   09/22/15 0906 09/22/15 0907  BP: (!) 164/84 (!) 157/89  Pulse: 78   Temp: 97.8 F (36.6 C)   TempSrc: Oral   SpO2: 97%   Weight: 192 lb 4.8 oz (87.2 kg)   Height:  (1.626 m)     GENERAL: The patient is a well-nourished female, in no acute distress. The vital signs are documented above. CARDIOVASCULAR: There is a regular rate and rhythm. PULMONARY: There is good air exchange bilaterally without wheezing or rales. The incision looks dramatically better.  It has essentially closed completely.  There is no erythema  MEDICAL ISSUES: Nearly resolved incisional drainage, status post left carotid endarterectomy.  The patient will contact me if she has any further questions.  She is scheduled to return to work this  Monday, August 14.  I have her scheduled for follow-up in December for carotid and her leg.  Durene Cal, MD [facility] [phone] Pager [phone]

## 2015-10-03 MED FILL — OLMESARTAN-HCTZ 20-12.5 MG: 20-12.5 | 90 days supply | Qty: 90 | Fill #2

## 2015-10-06 NOTE — Addendum Note (Signed)
Addended by: Sharee Pimple on: 10/06/2015 05:44 PM   Modules accepted: Orders

## 2015-10-23 MED FILL — EZETIMIBE-SIMVAS 10-40 MG T: 10-40 | 90 days supply | Qty: 90 | Fill #1

## 2015-10-24 MED FILL — KETOCONAZOLE 2% CREAM: 2 | 15 days supply | Qty: 30 | Fill #2

## 2015-11-21 MED FILL — CLOPIDOGREL 75 MG TABLET: 75 | 90 days supply | Qty: 90 | Fill #1

## 2015-11-21 MED FILL — metFORMIN HCL 500 MG TABS: 500 | 90 days supply | Qty: 360 | Fill #1

## 2015-12-05 DIAGNOSIS — E784 Other hyperlipidemia: Secondary | ICD-10-CM | POA: Diagnosis not present

## 2015-12-05 DIAGNOSIS — I10 Essential (primary) hypertension: Secondary | ICD-10-CM | POA: Diagnosis not present

## 2015-12-05 DIAGNOSIS — E1151 Type 2 diabetes mellitus with diabetic peripheral angiopathy without gangrene: Secondary | ICD-10-CM | POA: Diagnosis not present

## 2015-12-12 DIAGNOSIS — I7389 Other specified peripheral vascular diseases: Secondary | ICD-10-CM | POA: Diagnosis not present

## 2015-12-12 DIAGNOSIS — Z0000 Encounter for general adult medical examination without abnormal findings: Secondary | ICD-10-CM | POA: Diagnosis not present

## 2015-12-12 DIAGNOSIS — Z95828 Presence of other vascular implants and grafts: Secondary | ICD-10-CM | POA: Diagnosis not present

## 2015-12-12 DIAGNOSIS — E611 Iron deficiency: Secondary | ICD-10-CM | POA: Diagnosis not present

## 2015-12-12 DIAGNOSIS — Z9889 Other specified postprocedural states: Secondary | ICD-10-CM | POA: Diagnosis not present

## 2015-12-12 DIAGNOSIS — I350 Nonrheumatic aortic (valve) stenosis: Secondary | ICD-10-CM | POA: Diagnosis not present

## 2015-12-12 DIAGNOSIS — E1151 Type 2 diabetes mellitus with diabetic peripheral angiopathy without gangrene: Secondary | ICD-10-CM | POA: Diagnosis not present

## 2015-12-12 DIAGNOSIS — E781 Pure hyperglyceridemia: Secondary | ICD-10-CM | POA: Diagnosis not present

## 2015-12-12 DIAGNOSIS — Z23 Encounter for immunization: Secondary | ICD-10-CM | POA: Diagnosis not present

## 2015-12-12 DIAGNOSIS — I6529 Occlusion and stenosis of unspecified carotid artery: Secondary | ICD-10-CM | POA: Diagnosis not present

## 2015-12-12 MED FILL — OMEPRAZOLE 20 MG CAPSULE DR: 20 | 90 days supply | Qty: 90 | Fill #0

## 2015-12-18 DIAGNOSIS — Z1212 Encounter for screening for malignant neoplasm of rectum: Secondary | ICD-10-CM | POA: Diagnosis not present

## 2015-12-22 MED FILL — METOPROLOL TARTRATE 25 MG T: 25 | 90 days supply | Qty: 180 | Fill #0

## 2015-12-29 MED FILL — OLMESARTAN-HCTZ 20-12.5 MG: 20-12.5 | 90 days supply | Qty: 90 | Fill #3

## 2016-01-19 MED FILL — EZETIMIBE-SIMVAS 10-40 MG T: 10-40 | 90 days supply | Qty: 90 | Fill #2

## 2016-02-13 MED FILL — KETOCONAZOLE 2% CREAM: 2 | 15 days supply | Qty: 30 | Fill #3

## 2016-02-13 MED FILL — CLOPIDOGREL 75 MG TABLET: 75 | 90 days supply | Qty: 90 | Fill #2

## 2016-02-23 ENCOUNTER — Ambulatory Visit: Payer: 59 | Admitting: Surgery

## 2016-02-23 ENCOUNTER — Other Ambulatory Visit (HOSPITAL_COMMUNITY): Payer: 59

## 2016-02-23 ENCOUNTER — Encounter (HOSPITAL_COMMUNITY): Payer: 59

## 2016-03-02 ENCOUNTER — Encounter: Payer: Self-pay | Admitting: Surgery

## 2016-03-08 ENCOUNTER — Ambulatory Visit (HOSPITAL_COMMUNITY)
Admission: RE | Admit: 2016-03-08 | Discharge: 2016-03-08 | Disposition: A | Discharge status: Home / Self Care | Payer: 59 | Source: Ambulatory Visit | Attending: Surgery | Admitting: Surgery

## 2016-03-08 ENCOUNTER — Ambulatory Visit (INDEPENDENT_AMBULATORY_CARE_PROVIDER_SITE_OTHER): Payer: 59 | Admitting: Surgery

## 2016-03-08 ENCOUNTER — Ambulatory Visit (INDEPENDENT_AMBULATORY_CARE_PROVIDER_SITE_OTHER)
Admission: RE | Admit: 2016-03-08 | Discharge: 2016-03-08 | Disposition: A | Discharge status: Home / Self Care | Payer: 59 | Source: Ambulatory Visit | Attending: Family | Admitting: Family

## 2016-03-08 ENCOUNTER — Encounter: Payer: Self-pay | Admitting: Surgery

## 2016-03-08 VITALS — BP 135/74 | HR 71 | Temp 98.0°F | Resp 20 | Ht 64.0 in | Wt 192.0 lb

## 2016-03-08 DIAGNOSIS — I739 Peripheral vascular disease, unspecified: Secondary | ICD-10-CM

## 2016-03-08 DIAGNOSIS — Z48812 Encounter for surgical aftercare following surgery on the circulatory system: Secondary | ICD-10-CM

## 2016-03-08 DIAGNOSIS — Z95828 Presence of other vascular implants and grafts: Secondary | ICD-10-CM | POA: Insufficient documentation

## 2016-03-08 DIAGNOSIS — I6522 Occlusion and stenosis of left carotid artery: Secondary | ICD-10-CM | POA: Diagnosis not present

## 2016-03-08 DIAGNOSIS — Z8249 Family history of ischemic heart disease and other diseases of the circulatory system: Secondary | ICD-10-CM

## 2016-03-08 DIAGNOSIS — R0989 Other specified symptoms and signs involving the circulatory and respiratory systems: Secondary | ICD-10-CM | POA: Insufficient documentation

## 2016-03-08 LAB — VAS US CAROTID
LCCADDIAS: 30 cm/s
LCCADSYS: 100 cm/s
LEFT ECA DIAS: -27 cm/s
LEFT VERTEBRAL DIAS: 17 cm/s
LICAPDIAS: -77 cm/s
Left CCA prox dias: 30 cm/s
Left CCA prox sys: 108 cm/s
Left ICA prox sys: -234 cm/s
RCCAPSYS: 94 cm/s
RIGHT CCA MID DIAS: -28 cm/s
RIGHT ECA DIAS: -14 cm/s
RIGHT VERTEBRAL DIAS: 22 cm/s
Right CCA prox dias: 16 cm/s
Right cca dist sys: -90 cm/s

## 2016-03-08 MED FILL — OMEPRAZOLE 20 MG CAPSULE DR: 20 | 90 days supply | Qty: 90 | Fill #1

## 2016-03-08 NOTE — Progress Notes (Signed)
[facility] [place]  Patient name: Paula [name] MRN: [id number] DOB: Aug 03, 1945 Sex: female   REASON FOR VISIT:    Follow up  HISOTRY OF PRESENT ILLNESS:    The patient comes back today for followup. She is well-known to our office. She is status post right femoral to anterior tibial bypass graft by Dr. Madilyn Fireman as well as a left femoral to below knee popliteal artery bypass graft by Dr. Madilyn Fireman. These were both done for limb salvage.  She is status post left carotid endarterectomy with patch angioplasty on 08/22/2015 for symptomatic left carotid stenosis.  Intraoperative findings included a 90% stenosis.  Her biggest complaint today is that of right knee pain.  She denies open wounds.  She denies neurologic symptoms.  She denies claudication.   PAST MEDICAL HISTORY:   Past Medical History:  Diagnosis Date  . Anemia   . Aortic stenosis    mild AS 04/2015 echo  . Diabetes mellitus   . GERD (gastroesophageal reflux disease)   . Headache    migraines  . Heart murmur    "mild"  . Hyperlipidemia   . Hypertension   . PONV (postoperative nausea and vomiting)    "no problems since 1989"  . PVD (peripheral vascular disease) (HCC)   . Varicose veins      FAMILY HISTORY:   Family History  Problem Relation Age of Onset  . Dementia Mother   . Atrial fibrillation Mother   . Hiatal hernia Mother   . Hyperlipidemia Mother   . Hypertension Mother   . Other Mother     Varicose veins  . Varicose Veins Mother   . Heart disease Father     Before age 106, small MI  . AAA (abdominal aortic aneurysm) Father     Died age 65  . Hyperlipidemia Father     SOCIAL HISTORY:   Social History  Substance Use Topics  . Smoking status: Never Smoker  . Smokeless tobacco: Never Used  . Alcohol use Yes     Comment: very rarely     ALLERGIES:   Allergies  Allergen Reactions  . Codeine Nausea And Vomiting    "I can take the  synthetics"     CURRENT MEDICATIONS:   Current Outpatient Prescriptions  Medication Sig Dispense Refill  . acetaminophen (TYLENOL) 325 MG tablet Take 1-2 tablets (325-650 mg total) by mouth every 6 (six) hours as needed.    Marland Kitchen aspirin EC 325 MG tablet Take 325 mg by mouth daily.      . clopidogrel (PLAVIX) 75 MG tablet Take 75 mg by mouth daily.      Marland Kitchen ezetimibe-simvastatin (VYTORIN) 10-40 MG per tablet Take 1 tablet by mouth daily. 30 tablet 11  . ketoconazole (NIZORAL) 2 % cream Apply 1 application topically daily as needed for irritation (under the breast for yeast).    . metFORMIN (GLUCOPHAGE) 500 MG tablet Take 500-1,000 mg by mouth 2 (two) times daily with a meal. Take 2 Tabs in the morning and one at night    . metoprolol (LOPRESSOR) 50 MG tablet Take 50 mg by mouth 2 (two) times daily.    Marland Kitchen olmesartan-hydrochlorothiazide (BENICAR HCT) 20-12.5 MG per tablet Take 1 tablet by mouth daily.    Marland Kitchen omeprazole (PRILOSEC) 20 MG capsule Take 20 mg by mouth daily.      . traMADol (ULTRAM) 50 MG tablet Take 1 tablet (50 mg total) by mouth at bedtime as needed for moderate pain.  12 tablet 0  . cephALEXin (KEFLEX) 500 MG capsule Take 1 capsule (500 mg total) by mouth 4 (four) times daily. (Patient not taking: Reported on 03/08/2016) 20 capsule 0  . levofloxacin (LEVAQUIN) 500 MG tablet Take 1 tablet (500 mg total) by mouth daily. (Patient not taking: Reported on 03/08/2016) 10 tablet 0  . meloxicam (MOBIC) 15 MG tablet Take 15 mg by mouth daily.     No current facility-administered medications for this visit.     REVIEW OF SYSTEMS:   [X]  denotes positive finding, [ ]  denotes negative finding Cardiac  Comments:  Chest pain or chest pressure:    Shortness of breath upon exertion:    Short of breath when lying flat:    Irregular heart rhythm:        Vascular    Pain in calf, thigh, or hip brought on by ambulation:    Pain in feet at night that wakes you up from your sleep:     Blood clot in  your veins:    Leg swelling:         Pulmonary    Oxygen at home:    Productive cough:     Wheezing:         Neurologic    Sudden weakness in arms or legs:     Sudden numbness in arms or legs:     Sudden onset of difficulty speaking or slurred speech:    Temporary loss of vision in one eye:     Problems with dizziness:         Gastrointestinal    Blood in stool:     Vomited blood:         Genitourinary    Burning when urinating:     Blood in urine:        Psychiatric    Major depression:         Hematologic    Bleeding problems:    Problems with blood clotting too easily:        Skin    Rashes or ulcers:        Constitutional    Fever or chills:      PHYSICAL EXAM:   Vitals:   03/08/16 1046 03/08/16 1050  BP: (!) 150/75 135/74  Pulse: 71   Resp: 20   Temp: 98 F (36.7 C)   TempSrc: Oral   SpO2: 97%   Weight: 192 lb (87.1 kg)   Height: 5\' 4"  (1.626 m)     GENERAL: The patient is a well-nourished female, in no acute distress. The vital signs are documented above. CARDIAC: There is a regular rate and rhythm.  VASCULAR: Radiating cardiac murmur into the neck.  Palpable right leg bypass graft at the ankle PULMONARY: Non-labored respirations MUSCULOSKELETAL: There are no major deformities or cyanosis. NEUROLOGIC: No focal weakness or paresthesias are detected. SKIN: There are no ulcers or rashes noted. PSYCHIATRIC: The patient has a normal affect.  STUDIES:   I have ordered and reviewed her vascular lab studies.  ABI on the left is 0.98 on the right is 1.13.  She has biphasic waveforms.  Duplex shows no hemodynamically significant lesions.  Carotid duplex: There are elevated velocities within the left carotid endarterectomy site.  This is in the 60-79% category.  The right is 40-59%  MEDICAL ISSUES:   Lower extremity vascular disease: Patient will continue with surveillance.  There are no active issues Carotid disease: Elevated velocities were  identified within the carotid endarterectomy  site.  This is in the 60-79% category.  This likely represents neointimal hyperplasia.  She is asymptomatic.  We will continue to follow this.  She will follow up in 6 months with a repeat duplex    Durene Cal, MD [facility] [phone] Pager [phone]

## 2016-03-12 NOTE — Addendum Note (Signed)
Addended by: Burton Apley A on: 03/12/2016 12:01 PM   Modules accepted: Orders

## 2016-03-17 ENCOUNTER — Encounter: Payer: Self-pay | Admitting: Emergency Medicine

## 2016-03-17 ENCOUNTER — Emergency Department
Admission: EM | Admit: 2016-03-17 | Discharge: 2016-03-17 | Disposition: A | Discharge status: Home / Self Care | Payer: 59 | Source: Home / Self Care | Attending: Family Medicine | Admitting: Family Medicine

## 2016-03-17 DIAGNOSIS — L03116 Cellulitis of left lower limb: Secondary | ICD-10-CM | POA: Diagnosis not present

## 2016-03-17 MED ORDER — CEPHALEXIN 500 MG PO CAPS
500.0000 mg | ORAL_CAPSULE | Freq: Four times a day (QID) | ORAL | 0 refills | Status: DC
Start: 2016-03-17 — End: 2016-09-13

## 2016-03-17 MED FILL — CEPHALEXIN 500 MG CAPSULE: 500 | 7 days supply | Qty: 28 | Fill #0

## 2016-03-17 NOTE — ED Provider Notes (Signed)
CSN: [id number]     Arrival date & time 03/17/16  1415 History   First MD Initiated Contact with Patient 03/17/16 1450     Chief Complaint  Patient presents with  . Cellulitis   (Consider location/radiation/quality/duration/timing/severity/associated sxs/prior Treatment) HPI  Paula [name] is a 71 y.o. female presenting to UC with c/o 24 hours of gradually worsening Left lower leg pain, redness, and mild swelling.  She reports having 2 "leftover" keflex she she took those and thinks the redness was slightly better this morning but is concerned she may have cellulitis.  Hx of same several years ago.  Denies fever, chills, n/v/d.     Past Medical History:  Diagnosis Date  . Anemia   . Aortic stenosis    mild AS 04/2015 echo  . Diabetes mellitus   . GERD (gastroesophageal reflux disease)   . Headache    migraines  . Heart murmur    "mild"  . Hyperlipidemia   . Hypertension   . PONV (postoperative nausea and vomiting)    "no problems since 1989"  . PVD (peripheral vascular disease) (HCC)   . Varicose veins    Past Surgical History:  Procedure Laterality Date  . ABDOMINAL AORTAGRAM N/A 01/20/2011   Procedure: ABDOMINAL Ronny Flurry;  Surgeon: Nada Libman, MD;  Location[facility] CATH LAB;  Service: Cardiovascular;  Laterality: N/A;  . ADENOIDECTOMY    . Anterior tibial BPG Right 2010   Fem-to- anterior Tibial  . CARPAL TUNNEL RELEASE Bilateral   . COLON RESECTION    . COLONOSCOPY    . ENDARTERECTOMY Left 08/22/2015   Procedure: ENDARTERECTOMY LEFT CAROTID ARTERY;  Surgeon: Nada Libman, MD;  Location: [facility] OR;  Service: Vascular;  Laterality: Left;  . femerol popliteal Left Jan 2007  . FEMORAL BYPASS Left 2005  . PATCH ANGIOPLASTY Left 08/22/2015   Procedure: PATCH ANGIOPLASTY LEFT CAROTID ARTERY USING Livia Snellen BIOLOGIC PATCH;  Surgeon: Nada Libman, MD;  Location: [facility] OR;  Service: Vascular;  Laterality: Left;  . TARSAL TUNNEL RELEASE    . TONSILLECTOMY    . TUBAL LIGATION     . Vein patch Left May 2007   Left leg vein patch    Family History  Problem Relation Age of Onset  . Dementia Mother   . Atrial fibrillation Mother   . Hiatal hernia Mother   . Hyperlipidemia Mother   . Hypertension Mother   . Other Mother     Varicose veins  . Varicose Veins Mother   . Heart disease Father     Before age 66, small MI  . AAA (abdominal aortic aneurysm) Father     Died age 61  . Hyperlipidemia Father    Social History  Substance Use Topics  . Smoking status: Never Smoker  . Smokeless tobacco: Never Used  . Alcohol use Yes     Comment: very rarely   OB History    No data available     Review of Systems  Constitutional: Negative for chills and fever.  HENT: Negative for congestion, ear pain, sore throat, trouble swallowing and voice change.   Respiratory: Negative for cough and shortness of breath.   Cardiovascular: Negative for chest pain and palpitations.  Gastrointestinal: Negative for abdominal pain, diarrhea, nausea and vomiting.  Musculoskeletal: Positive for arthralgias, joint swelling and myalgias. Negative for back pain.       Left ankle  Skin: Positive for color change and rash. Negative for wound.    Allergies  Codeine  Home Medications   Prior to Admission medications   Medication Sig Start Date End Date Taking? Authorizing Provider  acetaminophen (TYLENOL) 325 MG tablet Take 1-2 tablets (325-650 mg total) by mouth every 6 (six) hours as needed. 08/23/15   Samantha J Rhyne, PA-C  aspirin EC 325 MG tablet Take 325 mg by mouth daily.      Historical Provider, MD  cephALEXin (KEFLEX) 500 MG capsule Take 1 capsule (500 mg total) by mouth 4 (four) times daily. For 7 days 03/17/16   Junius Finner, PA-C  clopidogrel (PLAVIX) 75 MG tablet Take 75 mg by mouth daily.      Historical Provider, MD  ezetimibe-simvastatin (VYTORIN) 10-40 MG per tablet Take 1 tablet by mouth daily. 04/25/13   Creola Corn, MD  ketoconazole (NIZORAL) 2 % cream Apply 1  application topically daily as needed for irritation (under the breast for yeast).    Historical Provider, MD  meloxicam (MOBIC) 15 MG tablet Take 15 mg by mouth daily.    Historical Provider, MD  metFORMIN (GLUCOPHAGE) 500 MG tablet Take 500-1,000 mg by mouth 2 (two) times daily with a meal. Take 2 Tabs in the morning and one at night    Historical Provider, MD  metoprolol (LOPRESSOR) 50 MG tablet Take 50 mg by mouth 2 (two) times daily.    Historical Provider, MD  olmesartan-hydrochlorothiazide (BENICAR HCT) 20-12.5 MG per tablet Take 1 tablet by mouth daily.    Historical Provider, MD  omeprazole (PRILOSEC) 20 MG capsule Take 20 mg by mouth daily.      Historical Provider, MD   Meds Ordered and Administered this Visit  Medications - No data to display  BP 127/82 (BP Location: Left Arm)   Pulse 85   Temp 98.2 F (36.8 C) (Oral)   Ht 5\' 3"  (1.6 m)   Wt 186 lb (84.4 kg)   SpO2 97%   BMI 32.95 kg/m  No data found.   Physical Exam  Constitutional: She is oriented to person, place, and time. She appears well-developed and well-nourished.  HENT:  Head: Normocephalic and atraumatic.  Eyes: EOM are normal.  Neck: Normal range of motion.  Cardiovascular: Normal rate.   Pulmonary/Chest: Effort normal.  Musculoskeletal: Normal range of motion. She exhibits edema and tenderness.  Left lower leg/ankle: mild edema with tenderness to achilles. Full ROM ankle and toes. Calf is soft, non-tender.  Neurological: She is alert and oriented to person, place, and time.  Skin: Skin is warm and dry. No rash noted. There is erythema.  Left lower leg: erythema to posterior leg, erythematous and warm. Tender. Skin in tact. No bleeding or discharge. No induration or fluctuance.  Psychiatric: She has a normal mood and affect. Her behavior is normal.  Nursing note and vitals reviewed.   Urgent Care Course     Procedures (including critical care time)  Labs Review Labs Reviewed - No data to  display  Imaging Review No results found.    MDM   1. Cellulitis of left lower leg    Hx and exam c/w cellulitis  Rx: Keflex  Home care instructions provided. F/u with PCP in  4-5 days if not improving.     Junius Finner, PA-C 03/17/16 2002

## 2016-03-17 NOTE — ED Triage Notes (Signed)
Cellulitis of lower left leg, red, swollen, painful x 24 hrs

## 2016-03-22 MED FILL — METOPROLOL TARTRATE 25 MG T: 25 | 90 days supply | Qty: 180 | Fill #1

## 2016-03-22 MED FILL — metFORMIN HCL 500 MG TABS: 500 | 90 days supply | Qty: 360 | Fill #2

## 2016-04-12 MED FILL — OLMESARTAN-HCTZ 20-12.5 MG: 20-12.5 | 90 days supply | Qty: 90 | Fill #0

## 2016-04-12 MED FILL — EZETIMIBE-SIMVAS 10-40 MG T: 10-40 | 90 days supply | Qty: 90 | Fill #0

## 2016-05-17 MED FILL — PANTOPRAZOLE SOD DR 40 MG T: 40 | 90 days supply | Qty: 90 | Fill #0

## 2016-05-17 MED FILL — CLOPIDOGREL 75 MG TABLET: 75 | 90 days supply | Qty: 90 | Fill #3

## 2016-06-21 MED FILL — METOPROLOL TARTRATE 25 MG T: 25 | 90 days supply | Qty: 180 | Fill #2

## 2016-06-29 DIAGNOSIS — D692 Other nonthrombocytopenic purpura: Secondary | ICD-10-CM | POA: Diagnosis not present

## 2016-06-29 DIAGNOSIS — E784 Other hyperlipidemia: Secondary | ICD-10-CM | POA: Diagnosis not present

## 2016-06-29 DIAGNOSIS — D696 Thrombocytopenia, unspecified: Secondary | ICD-10-CM | POA: Diagnosis not present

## 2016-06-29 DIAGNOSIS — I7389 Other specified peripheral vascular diseases: Secondary | ICD-10-CM | POA: Diagnosis not present

## 2016-06-29 DIAGNOSIS — Z780 Asymptomatic menopausal state: Secondary | ICD-10-CM | POA: Diagnosis not present

## 2016-06-29 DIAGNOSIS — I10 Essential (primary) hypertension: Secondary | ICD-10-CM | POA: Diagnosis not present

## 2016-06-29 DIAGNOSIS — E1151 Type 2 diabetes mellitus with diabetic peripheral angiopathy without gangrene: Secondary | ICD-10-CM | POA: Diagnosis not present

## 2016-06-29 DIAGNOSIS — I350 Nonrheumatic aortic (valve) stenosis: Secondary | ICD-10-CM | POA: Diagnosis not present

## 2016-06-29 DIAGNOSIS — Z95828 Presence of other vascular implants and grafts: Secondary | ICD-10-CM | POA: Diagnosis not present

## 2016-06-29 DIAGNOSIS — M859 Disorder of bone density and structure, unspecified: Secondary | ICD-10-CM | POA: Diagnosis not present

## 2016-06-29 DIAGNOSIS — I6529 Occlusion and stenosis of unspecified carotid artery: Secondary | ICD-10-CM | POA: Diagnosis not present

## 2016-07-06 MED FILL — OLMESARTAN-HCTZ 20-12.5 MG: 20-12.5 | 90 days supply | Qty: 90 | Fill #1

## 2016-07-21 MED FILL — EZETIMIBE-SIMVAS 10-40 MG T: 10-40 | 90 days supply | Qty: 90 | Fill #1

## 2016-07-21 MED FILL — metFORMIN HCL 500 MG TABS: 500 | 90 days supply | Qty: 360 | Fill #0

## 2016-08-24 MED FILL — CLOPIDOGREL 75 MG TABLET: 75 | 90 days supply | Qty: 90 | Fill #0

## 2016-09-03 ENCOUNTER — Encounter: Payer: Self-pay | Admitting: Surgery

## 2016-09-13 ENCOUNTER — Ambulatory Visit (INDEPENDENT_AMBULATORY_CARE_PROVIDER_SITE_OTHER)
Admission: RE | Admit: 2016-09-13 | Discharge: 2016-09-13 | Disposition: A | Discharge status: Home / Self Care | Payer: 59 | Source: Ambulatory Visit | Attending: Surgery | Admitting: Surgery

## 2016-09-13 ENCOUNTER — Encounter: Payer: Self-pay | Admitting: Surgery

## 2016-09-13 ENCOUNTER — Ambulatory Visit (INDEPENDENT_AMBULATORY_CARE_PROVIDER_SITE_OTHER): Payer: 59 | Admitting: Surgery

## 2016-09-13 ENCOUNTER — Ambulatory Visit (HOSPITAL_COMMUNITY)
Admission: RE | Admit: 2016-09-13 | Discharge: 2016-09-13 | Disposition: A | Discharge status: Home / Self Care | Payer: 59 | Source: Ambulatory Visit | Attending: Surgery | Admitting: Surgery

## 2016-09-13 VITALS — BP 137/77 | HR 94 | Temp 98.6°F | Resp 16 | Ht 63.0 in | Wt 187.2 lb

## 2016-09-13 DIAGNOSIS — R938 Abnormal findings on diagnostic imaging of other specified body structures: Secondary | ICD-10-CM | POA: Diagnosis not present

## 2016-09-13 DIAGNOSIS — E785 Hyperlipidemia, unspecified: Secondary | ICD-10-CM | POA: Insufficient documentation

## 2016-09-13 DIAGNOSIS — I6523 Occlusion and stenosis of bilateral carotid arteries: Secondary | ICD-10-CM

## 2016-09-13 DIAGNOSIS — I10 Essential (primary) hypertension: Secondary | ICD-10-CM | POA: Diagnosis not present

## 2016-09-13 DIAGNOSIS — I739 Peripheral vascular disease, unspecified: Secondary | ICD-10-CM

## 2016-09-13 DIAGNOSIS — R0989 Other specified symptoms and signs involving the circulatory and respiratory systems: Secondary | ICD-10-CM | POA: Diagnosis present

## 2016-09-13 DIAGNOSIS — E1151 Type 2 diabetes mellitus with diabetic peripheral angiopathy without gangrene: Secondary | ICD-10-CM | POA: Diagnosis not present

## 2016-09-13 DIAGNOSIS — I6522 Occlusion and stenosis of left carotid artery: Secondary | ICD-10-CM

## 2016-09-13 LAB — VAS US CAROTID
LCCAPSYS: 110 cm/s
LEFT ECA DIAS: -26 cm/s
LEFT VERTEBRAL DIAS: 31 cm/s
Left CCA dist dias: -32 cm/s
Left CCA dist sys: -116 cm/s
Left CCA prox dias: 33 cm/s
Left ICA dist dias: -43 cm/s
Left ICA dist sys: -105 cm/s
Left ICA prox dias: -100 cm/s
Left ICA prox sys: -237 cm/s
RCCADSYS: -122 cm/s
RIGHT CCA MID DIAS: -27 cm/s
RIGHT ECA DIAS: -19 cm/s
RIGHT VERTEBRAL DIAS: -20 cm/s
Right CCA prox dias: 26 cm/s
Right CCA prox sys: 143 cm/s

## 2016-09-13 MED FILL — PANTOPRAZOLE SOD DR 40 MG T: 40 | 90 days supply | Qty: 90 | Fill #1

## 2016-09-13 NOTE — Progress Notes (Signed)
[facility] [place]  Patient name: Paula [name] MRN: [id number] DOB: 28-Nov-1945 Sex: female   REASON FOR VISIT:    Follow up  HISOTRY OF PRESENT ILLNESS:     Paula [name] is a 71 y.o. female who returns today for follow up.. She is well-known to our office. She is status post right femoral to anterior tibial bypass graft by Dr. Madilyn Fireman as well as a left femoral to below knee popliteal artery bypass graft by Dr. Madilyn Fireman. These were both done for limb salvage.  She is status post left carotid endarterectomy with patch angioplasty on 08/22/2015 for symptomatic left carotid stenosis. Intraoperative findings included a 90% stenosis. She is being seen today for surveillance.  She has not had any further neurologic symptoms since her carotid endarterectomy.  She denies any acute changes in her legs.  There are no open wounds.    PAST MEDICAL HISTORY:   Past Medical History:  Diagnosis Date  . Anemia   . Aortic stenosis    mild AS 04/2015 echo  . Diabetes mellitus   . GERD (gastroesophageal reflux disease)   . Headache    migraines  . Heart murmur    "mild"  . Hyperlipidemia   . Hypertension   . PONV (postoperative nausea and vomiting)    "no problems since 1989"  . PVD (peripheral vascular disease) (HCC)   . Varicose veins      FAMILY HISTORY:   Family History  Problem Relation Age of Onset  . Dementia Mother   . Atrial fibrillation Mother   . Hiatal hernia Mother   . Hyperlipidemia Mother   . Hypertension Mother   . Other Mother        Varicose veins  . Varicose Veins Mother   . Heart disease Father        Before age 35, small MI  . AAA (abdominal aortic aneurysm) Father        Died age 56  . Hyperlipidemia Father     SOCIAL HISTORY:   Social History  Substance Use Topics  . Smoking status: Never Smoker  . Smokeless tobacco: Never Used  . Alcohol use Yes     Comment: very rarely      ALLERGIES:   Allergies  Allergen Reactions  . Codeine Nausea And Vomiting    "I can take the synthetics"     CURRENT MEDICATIONS:   Current Outpatient Prescriptions  Medication Sig Dispense Refill  . acetaminophen (TYLENOL) 325 MG tablet Take 1-2 tablets (325-650 mg total) by mouth every 6 (six) hours as needed.    Marland Kitchen aspirin EC 325 MG tablet Take 325 mg by mouth daily.      . clopidogrel (PLAVIX) 75 MG tablet Take 75 mg by mouth daily.      Marland Kitchen ezetimibe-simvastatin (VYTORIN) 10-40 MG per tablet Take 1 tablet by mouth daily. 30 tablet 11  . ketoconazole (NIZORAL) 2 % cream Apply 1 application topically daily as needed for irritation (under the breast for yeast).    . meloxicam (MOBIC) 15 MG tablet Take 15 mg by mouth daily.    . metFORMIN (GLUCOPHAGE) 500 MG tablet Take 500-1,000 mg by mouth 2 (two) times daily with a meal. Take 2 Tabs in the morning and one at night    . metoprolol (LOPRESSOR) 50 MG tablet Take 50 mg by mouth 2 (two) times daily.    Marland Kitchen olmesartan-hydrochlorothiazide (BENICAR HCT) 20-12.5 MG per tablet Take 1 tablet by mouth  daily.    . pantoprazole (PROTONIX) 40 MG tablet Take 40 mg by mouth daily.     No current facility-administered medications for this visit.     REVIEW OF SYSTEMS:   [X]  denotes positive finding, [ ]  denotes negative finding Cardiac  Comments:  Chest pain or chest pressure:    Shortness of breath upon exertion:    Short of breath when lying flat:    Irregular heart rhythm:        Vascular    Pain in calf, thigh, or hip brought on by ambulation:    Pain in feet at night that wakes you up from your sleep:     Blood clot in your veins:    Leg swelling:         Pulmonary    Oxygen at home:    Productive cough:     Wheezing:         Neurologic    Sudden weakness in arms or legs:     Sudden numbness in arms or legs:     Sudden onset of difficulty speaking or slurred speech:    Temporary loss of vision in one eye:     Problems  with dizziness:         Gastrointestinal    Blood in stool:     Vomited blood:         Genitourinary    Burning when urinating:     Blood in urine:        Psychiatric    Major depression:         Hematologic    Bleeding problems:    Problems with blood clotting too easily:        Skin    Rashes or ulcers:        Constitutional    Fever or chills:      PHYSICAL EXAM:   Vitals:   09/13/16 1542 09/13/16 1544  BP: 139/68 137/77  Pulse: 94   Resp: 16   Temp: 98.6 F (37 C)   TempSrc: Oral   SpO2: 95%   Weight: 187 lb 3.2 oz (84.9 kg)   Height: 5\' 3"  (1.6 m)     GENERAL: The patient is a well-nourished female, in no acute distress.  The vital signs are documented above. CARDIAC: There is a regular rate and rhythm.  VASCULAR: I cannot palpate pedal pulses today.  No carotid bruits.  I have ordered and reviewed her vascular studies today. PULMONARY: Non-labored respirations MUSCULOSKELETAL: There are no major deformities or cyanosis. NEUROLOGIC: No focal weakness or paresthesias are detected. SKIN: There are no ulcers or rashes noted. PSYCHIATRIC: The patient has a normal affect.  STUDIES:   I have ordered and reviewed the following vascular lab studies:  ABI: Right 0.94.  Left 0.91.  Initially the right was 1.13 and the left was 0.98 Lower extremity duplex: Elevated velocities in the right outflow vessel,  Stable Carotid: Stable 40-59 percent right stenosis and 60-79 percent left    ASSESSMENT & PLAN:  Carotid: The patient remained asymptomatic.  There are elevations within the left endarterectomy site that remain less than 80%.  We'll close watch on these.  Should she require intervention, we would consider stenting.  She should continue her antiplatelet medication  Lower extremity: The patient's ABIs have dropped slightly but there is not a significant obvious source.  She has not developed any new symptoms.  I'll continue to keep a close watch on this with  follow up again in 6 months.      Durene Cal, MD [facility] [phone] Pager [phone]

## 2016-09-21 NOTE — Addendum Note (Signed)
Addended by: Burton Apley A on: 09/21/2016 04:09 PM   Modules accepted: Orders

## 2016-09-27 MED FILL — METOPROLOL TARTRATE 25 MG T: 25 | 90 days supply | Qty: 180 | Fill #0

## 2016-10-04 MED FILL — OLMESARTAN-HCTZ 20-12.5 MG: 20-12.5 | 90 days supply | Qty: 90 | Fill #2

## 2016-10-25 MED FILL — EZETIMIBE-SIMVAS 10-40 MG T: 10-40 | 90 days supply | Qty: 90 | Fill #2

## 2016-11-15 MED FILL — CLOPIDOGREL 75 MG TABLET: 75 | 90 days supply | Qty: 90 | Fill #1

## 2016-11-21 MED FILL — metFORMIN HCL 500 MG TABS: 500 | 90 days supply | Qty: 360 | Fill #1

## 2016-12-13 MED FILL — PANTOPRAZOLE SOD DR 40 MG T: 40 | 90 days supply | Qty: 90 | Fill #2

## 2016-12-27 MED FILL — METOPROLOL TARTRATE 25 MG T: 25 | 90 days supply | Qty: 180 | Fill #1

## 2016-12-30 MED FILL — OLMESARTAN-HCTZ 20-12.5 MG: 20-12.5 | 90 days supply | Qty: 90 | Fill #0

## 2017-01-10 DIAGNOSIS — R0781 Pleurodynia: Secondary | ICD-10-CM | POA: Diagnosis not present

## 2017-01-10 DIAGNOSIS — W19XXXA Unspecified fall, initial encounter: Secondary | ICD-10-CM | POA: Diagnosis not present

## 2017-01-27 ENCOUNTER — Emergency Department (HOSPITAL_BASED_OUTPATIENT_CLINIC_OR_DEPARTMENT_OTHER): Payer: 59

## 2017-01-27 ENCOUNTER — Encounter (HOSPITAL_BASED_OUTPATIENT_CLINIC_OR_DEPARTMENT_OTHER): Payer: Self-pay | Admitting: Adult Health

## 2017-01-27 ENCOUNTER — Other Ambulatory Visit: Payer: Self-pay

## 2017-01-27 ENCOUNTER — Emergency Department (HOSPITAL_BASED_OUTPATIENT_CLINIC_OR_DEPARTMENT_OTHER)
Admission: EM | Admit: 2017-01-27 | Discharge: 2017-01-27 | Disposition: A | Discharge status: Home / Self Care | Payer: 59 | Attending: Physician Assistant | Admitting: Physician Assistant

## 2017-01-27 DIAGNOSIS — E1151 Type 2 diabetes mellitus with diabetic peripheral angiopathy without gangrene: Secondary | ICD-10-CM | POA: Insufficient documentation

## 2017-01-27 DIAGNOSIS — Z7901 Long term (current) use of anticoagulants: Secondary | ICD-10-CM | POA: Insufficient documentation

## 2017-01-27 DIAGNOSIS — R1011 Right upper quadrant pain: Secondary | ICD-10-CM | POA: Diagnosis not present

## 2017-01-27 DIAGNOSIS — Y9389 Activity, other specified: Secondary | ICD-10-CM | POA: Diagnosis not present

## 2017-01-27 DIAGNOSIS — Z79899 Other long term (current) drug therapy: Secondary | ICD-10-CM | POA: Diagnosis not present

## 2017-01-27 DIAGNOSIS — D649 Anemia, unspecified: Secondary | ICD-10-CM | POA: Diagnosis not present

## 2017-01-27 DIAGNOSIS — R109 Unspecified abdominal pain: Secondary | ICD-10-CM | POA: Diagnosis not present

## 2017-01-27 DIAGNOSIS — Z7982 Long term (current) use of aspirin: Secondary | ICD-10-CM | POA: Diagnosis not present

## 2017-01-27 DIAGNOSIS — E785 Hyperlipidemia, unspecified: Secondary | ICD-10-CM | POA: Insufficient documentation

## 2017-01-27 DIAGNOSIS — Y999 Unspecified external cause status: Secondary | ICD-10-CM | POA: Diagnosis not present

## 2017-01-27 DIAGNOSIS — Z7984 Long term (current) use of oral hypoglycemic drugs: Secondary | ICD-10-CM | POA: Diagnosis not present

## 2017-01-27 DIAGNOSIS — S2231XA Fracture of one rib, right side, initial encounter for closed fracture: Secondary | ICD-10-CM | POA: Diagnosis not present

## 2017-01-27 DIAGNOSIS — I10 Essential (primary) hypertension: Secondary | ICD-10-CM | POA: Diagnosis not present

## 2017-01-27 DIAGNOSIS — Y92094 Garage of other non-institutional residence as the place of occurrence of the external cause: Secondary | ICD-10-CM | POA: Insufficient documentation

## 2017-01-27 DIAGNOSIS — S299XXA Unspecified injury of thorax, initial encounter: Secondary | ICD-10-CM | POA: Diagnosis present

## 2017-01-27 DIAGNOSIS — W010XXA Fall on same level from slipping, tripping and stumbling without subsequent striking against object, initial encounter: Secondary | ICD-10-CM | POA: Insufficient documentation

## 2017-01-27 LAB — LIPASE, BLOOD: LIPASE: 34 U/L (ref 11–51)

## 2017-01-27 LAB — CBC WITH DIFFERENTIAL/PLATELET
BASOS ABS: 0 10*3/uL (ref 0.0–0.1)
BASOS PCT: 0 %
EOS ABS: 0.1 10*3/uL (ref 0.0–0.7)
Eosinophils Relative: 2 %
HEMATOCRIT: 32.9 % — AB (ref 36.0–46.0)
Hemoglobin: 9.8 g/dL — ABNORMAL LOW (ref 12.0–15.0)
Lymphocytes Relative: 42 %
Lymphs Abs: 2.1 10*3/uL (ref 0.7–4.0)
MCH: 23.5 pg — ABNORMAL LOW (ref 26.0–34.0)
MCHC: 29.8 g/dL — AB (ref 30.0–36.0)
MCV: 78.9 fL (ref 78.0–100.0)
MONO ABS: 0.3 10*3/uL (ref 0.1–1.0)
MONOS PCT: 7 %
NEUTROS ABS: 2.4 10*3/uL (ref 1.7–7.7)
Neutrophils Relative %: 49 %
PLATELETS: 161 10*3/uL (ref 150–400)
RBC: 4.17 MIL/uL (ref 3.87–5.11)
RDW: 15.9 % — AB (ref 11.5–15.5)
WBC: 4.8 10*3/uL (ref 4.0–10.5)

## 2017-01-27 LAB — COMPREHENSIVE METABOLIC PANEL
ALBUMIN: 4.1 g/dL (ref 3.5–5.0)
ALT: 13 U/L — ABNORMAL LOW (ref 14–54)
ANION GAP: 7 (ref 5–15)
AST: 20 U/L (ref 15–41)
Alkaline Phosphatase: 72 U/L (ref 38–126)
BUN: 21 mg/dL — AB (ref 6–20)
CHLORIDE: 106 mmol/L (ref 101–111)
CO2: 26 mmol/L (ref 22–32)
Calcium: 9.2 mg/dL (ref 8.9–10.3)
Creatinine, Ser: 0.78 mg/dL (ref 0.44–1.00)
GFR calc Af Amer: >60 mL/min (ref >60)
GFR calc non Af Amer: >60 mL/min (ref >60)
GLUCOSE: 135 mg/dL — AB (ref 65–99)
POTASSIUM: 4.3 mmol/L (ref 3.5–5.1)
SODIUM: 139 mmol/L (ref 135–145)
TOTAL PROTEIN: 7 g/dL (ref 6.5–8.1)
Total Bilirubin: 0.6 mg/dL (ref 0.3–1.2)

## 2017-01-27 MED ORDER — IOPAMIDOL (ISOVUE-300) INJECTION 61%
100.0000 mL | Freq: Once | INTRAVENOUS | Status: AC | PRN
  Administered 2017-01-27: 100 mL via INTRAVENOUS

## 2017-01-27 MED ORDER — OXYCODONE-ACETAMINOPHEN 5-325 MG PO TABS: 1.0000 | ORAL_TABLET | ORAL | 0 refills | Status: DC | PRN

## 2017-01-27 MED FILL — OXYCODONE-ACETAMINOPHEN 5-3: 5-325 | 2 days supply | Qty: 7 | Fill #0

## 2017-01-27 NOTE — ED Triage Notes (Signed)
Presents post fall while in her garage, she tripped over a garage stop and hit the concrete floor on her right side. This occurred over a week ago. She was seen aand treated at a UC and told nothing was broken. Since the fall she has been having "hot poker like pain" in the right upper quadrant underneath her breast. The pain is worse with inspiration and movement and goes all the way around her back. Endorses nausea today.

## 2017-01-27 NOTE — Discharge Instructions (Signed)
[place] you were seen for pain in the right chest for the past 2 weeks after a fall.  You were found to have a fracture on your eighth rib on the right side.  We are recommending that you continue to use the incentive spirometer that we have provided you and you can continue to take Tylenol as needed and we have provided you with a few pain pills to take in emergency.  Please follow-up with your primary doctor as needed or if symptoms do not improve over the next few weeks.

## 2017-01-27 NOTE — ED Notes (Signed)
Patient transported to CT 

## 2017-01-27 NOTE — ED Provider Notes (Signed)
[facility] EMERGENCY DEPARTMENT Provider Note   CSN: [id number] Arrival date & time: 01/27/17  0865     History   Chief Complaint Chief Complaint  Patient presents with  . Abdominal Pain    HPI [place] is a 71 y.o. female.  [place] is a 71 year old obese female with history significant for vascular disease presenting with 2 weeks of pain in the right upper quadrant region after falling in her garage.  She reports the pain occasionally is focal in nature and sharp worse with movement and is often accompanied by pain in her right shoulder blade.  Pain is worse with inspiration and lying on her right side.  She denies any fever, chills, nausea, vomiting or diarrhea.  Does have history of diverticular abscess resulting in colon resection.  Patient does have a gallbladder.  She denies any tobacco, drugs or alcohol use.  She denies any abdominal pain, loss of appetite, exacerbation of of symptoms with fatty foods.  Prior to her fall she has never had any symptoms like this.  Reports that symptoms have worsened over the past 2-3 days and she wanted to come in to be evaluated.       Past Medical History:  Diagnosis Date  . Anemia   . Aortic stenosis    mild AS 04/2015 echo  . Diabetes mellitus   . GERD (gastroesophageal reflux disease)   . Headache    migraines  . Heart murmur    "mild"  . Hyperlipidemia   . Hypertension   . PONV (postoperative nausea and vomiting)    "no problems since 1989"  . PVD (peripheral vascular disease) (HCC)   . Varicose veins     Patient Active Problem List   Diagnosis Date Noted  . Carotid stenosis 08/22/2015  . PAD (peripheral artery disease) 05/11/2013  . PVD (peripheral vascular disease) 05/11/2013  . Acute renal failure (ARF) (HCC) 04/23/2013  . Type II or unspecified type diabetes mellitus with peripheral circulatory disorders, uncontrolled(250.72) 04/23/2013  . Hypotension, unspecified 04/23/2013  . Nausea with  vomiting 04/23/2013  . Diarrhea 04/23/2013  . Dehydration 04/23/2013  . Numbness or tingling-Feet 11/07/2012  . Pain in limb-Bilateral leg 11/07/2012  . Aftercare following surgery of the circulatory system, NEC 11/07/2012  . Peripheral vascular disease, unspecified (HCC) 11/07/2012  . Atherosclerosis of native arteries of the extremities with intermittent claudication 05/24/2011    Past Surgical History:  Procedure Laterality Date  . ABDOMINAL AORTAGRAM N/A 01/20/2011   Procedure: ABDOMINAL Ronny Flurry;  Surgeon: Nada Libman, MD;  Location: [facility] CATH LAB;  Service: Cardiovascular;  Laterality: N/A;  . ADENOIDECTOMY    . Anterior tibial BPG Right 2010   Fem-to- anterior Tibial  . CARPAL TUNNEL RELEASE Bilateral   . COLON RESECTION    . COLONOSCOPY    . ENDARTERECTOMY Left 08/22/2015   Procedure: ENDARTERECTOMY LEFT CAROTID ARTERY;  Surgeon: Nada Libman, MD;  Location: [facility] OR;  Service: Vascular;  Laterality: Left;  . femerol popliteal Left Jan 2007  . FEMORAL BYPASS Left 2005  . PATCH ANGIOPLASTY Left 08/22/2015   Procedure: PATCH ANGIOPLASTY LEFT CAROTID ARTERY USING Livia Snellen BIOLOGIC PATCH;  Surgeon: Nada Libman, MD;  Location: [facility] OR;  Service: Vascular;  Laterality: Left;  . TARSAL TUNNEL RELEASE    . TONSILLECTOMY    . TUBAL LIGATION    . Vein patch Left May 2007   Left leg vein patch     OB History    No data  available       Home Medications    Prior to Admission medications   Medication Sig Start Date End Date Taking? Authorizing Provider  acetaminophen (TYLENOL) 325 MG tablet Take 1-2 tablets (325-650 mg total) by mouth every 6 (six) hours as needed. 08/23/15   Rhyne, Ames Coupe, PA-C  aspirin EC 325 MG tablet Take 325 mg by mouth daily.      [provider]  clopidogrel (PLAVIX) 75 MG tablet Take 75 mg by mouth daily.      [provider]  ezetimibe-simvastatin (VYTORIN) 10-40 MG per tablet Take 1 tablet by mouth daily. 04/25/13   Creola Corn, MD   ketoconazole (NIZORAL) 2 % cream Apply 1 application topically daily as needed for irritation (under the breast for yeast).    [provider]  meloxicam (MOBIC) 15 MG tablet Take 15 mg by mouth daily.    [provider]  metFORMIN (GLUCOPHAGE) 500 MG tablet Take 500-1,000 mg by mouth 2 (two) times daily with a meal. Take 2 Tabs in the morning and one at night    [provider]  metoprolol (LOPRESSOR) 50 MG tablet Take 50 mg by mouth 2 (two) times daily.    [provider]  olmesartan-hydrochlorothiazide (BENICAR HCT) 20-12.5 MG per tablet Take 1 tablet by mouth daily.    [provider]  oxyCODONE-acetaminophen (PERCOCET/ROXICET) 5-325 MG tablet Take 1 tablet by mouth every 4 (four) hours as needed for severe pain. 01/27/17   Renne Musca, MD  pantoprazole (PROTONIX) 40 MG tablet Take 40 mg by mouth daily.    [provider]    Family History Family History  Problem Relation Age of Onset  . Dementia Mother   . Atrial fibrillation Mother   . Hiatal hernia Mother   . Hyperlipidemia Mother   . Hypertension Mother   . Other Mother        Varicose veins  . Varicose Veins Mother   . Heart disease Father        Before age 45, small MI  . AAA (abdominal aortic aneurysm) Father        Died age 49  . Hyperlipidemia Father     Social History Social History   Tobacco Use  . Smoking status: Never Smoker  . Smokeless tobacco: Never Used  Substance Use Topics  . Alcohol use: Yes    Comment: very rarely  . Drug use: No     Allergies   Codeine   Review of Systems Review of Systems  Constitutional: Negative.   HENT: Negative.   Eyes: Negative.   Respiratory: Negative.   Cardiovascular: Negative.   Gastrointestinal: Negative.   Endocrine: Negative.   Genitourinary: Negative.   Musculoskeletal: Negative.      Physical Exam Updated Vital Signs BP 126/62 (BP Location: Right Arm)   Pulse 68   Temp 98 F (36.7 C)  (Oral)   Resp 18   Ht 5' 3.5" (1.613 m)   Wt 85.7 kg (189 lb)   SpO2 100%   BMI 32.95 kg/m   Physical Exam  Constitutional: She is oriented to person, place, and time. She appears well-developed and well-nourished. She does not appear ill. No distress.  HENT:  Head: Normocephalic and atraumatic.  Mouth/Throat: Oropharynx is clear and moist.  Eyes: EOM are normal. Pupils are equal, round, and reactive to light.  Cardiovascular: Normal rate and regular rhythm.  Murmur heard. Pulmonary/Chest: Effort normal and breath sounds normal. No respiratory distress. She  has no wheezes.  Abdominal: Normal appearance. There is tenderness in the right upper quadrant. There is no rebound, no guarding, no CVA tenderness and negative Murphy's sign.  Musculoskeletal: Normal range of motion.       Right shoulder: Normal. She exhibits normal range of motion, no tenderness, no swelling, no effusion and no crepitus.  No gross deformities or edema.  Upper extremities with full active and passive range of motion.  No tenderness to palpation.  Weakness on the right side with supraspinatus testing.  Does have tenderness to palpation on the right lateral chest wall in the area of the 7-8 rib.   Neurological: She is alert and oriented to person, place, and time.  Skin: Skin is warm and dry. Capillary refill takes less than 2 seconds. No rash noted.  Psychiatric: She has a normal mood and affect.     ED Treatments / Results  Labs (all labs ordered are listed, but only abnormal results are displayed) Labs Reviewed  CBC WITH DIFFERENTIAL/PLATELET - Abnormal; Notable for the following components:      Result Value   Hemoglobin 9.8 (*)    HCT 32.9 (*)    MCH 23.5 (*)    MCHC 29.8 (*)    RDW 15.9 (*)    All other components within normal limits  COMPREHENSIVE METABOLIC PANEL - Abnormal; Notable for the following components:   Glucose, Bld 135 (*)    BUN 21 (*)    ALT 13 (*)    All other components within  normal limits  LIPASE, BLOOD    EKG  EKG Interpretation None       Radiology Ct Abdomen Pelvis W Contrast  Result Date: 01/27/2017 CLINICAL DATA:  Upper abdominal pain after fall 2 weeks ago. EXAM: CT ABDOMEN AND PELVIS WITH CONTRAST TECHNIQUE: Multidetector CT imaging of the abdomen and pelvis was performed using the standard protocol following bolus administration of intravenous contrast. CONTRAST:  ISOVUE-300 IOPAMIDOL (ISOVUE-300) INJECTION 61% COMPARISON:  CT 08/23/2008 FINDINGS: Lower chest: No acute abnormality. Hepatobiliary: No focal hepatic abnormality. Gallbladder unremarkable. Pancreas: No focal abnormality or ductal dilatation. Spleen: No focal abnormality.  Normal size. Adrenals/Urinary Tract: No adrenal abnormality. No focal renal abnormality. No stones or hydronephrosis. Urinary bladder is unremarkable. Stomach/Bowel: Stomach, large and small bowel grossly unremarkable. Appendix is normal. Vascular/Lymphatic: Aortic and iliac calcifications. No aneurysm or adenopathy. Reproductive: Uterus and adnexa unremarkable.  No mass. Other: No free fluid or free air. Musculoskeletal: Right lateral eighth rib fracture. Old appearing posterior right tenth and eleventh rib fractures. Degenerative changes in the lumbar spine. IMPRESSION: Right lateral eighth rib fracture which may be acute to subacute. Old appearing posterior right tenth and eleventh rib fractures. No acute findings in the abdomen or pelvis. Electronically Signed   By: Charlett Nose M.D.   On: 01/27/2017 10:36    Procedures Procedures (including critical care time)  Medications Ordered in ED Medications  iopamidol (ISOVUE-300) 61 % injection 100 mL (100 mLs Intravenous Contrast Given 01/27/17 1008)     Initial Impression / Assessment and Plan / ED Course  I have reviewed the triage vital signs and the nursing notes.  Pertinent labs & imaging results that were available during my care of the patient were reviewed  by me and considered in my medical decision making (see chart for details).    Patient is a 71 year old female presenting with pain on her right side for the past 2 weeks after a fall.  She denies any fevers,  chills and has no abdominal pain on exam.  She is tender to palpation on the right lateral side at the eighth rib.  Patient had CMP showing normal LFTs and CT abdomen pelvis showing right lateral eighth rib fracture.  Given that this is 2 weeks out from trauma, likelihood of developing pneumonia in this patient is low, however will discharge with incentive spirometer.  Additionally have recommended the patient take Tylenol as needed and have provided her with a few pain pills to use emergencies.  Recommended the patient follow-up with primary care doctor she does not have improvement in symptoms over the next few weeks.   Final Clinical Impressions(s) / ED Diagnoses   Final diagnoses:  Closed fracture of one rib of right side, initial encounter    ED Discharge Orders        Ordered    oxyCODONE-acetaminophen (PERCOCET/ROXICET) 5-325 MG tablet  Every 4 hours PRN     01/27/17 1049       Renne MuscaWarden, Xyla Leisner L, MD 01/27/17 1103    [place], Cindee Saltourteney Lyn, MD 01/27/17 1501

## 2017-02-04 MED FILL — EZETIMIBE-SIMVAS 10-40 MG T: 10-40 | 90 days supply | Qty: 90 | Fill #0

## 2017-02-11 MED FILL — CLOPIDOGREL 75 MG TABLET: 75 | 90 days supply | Qty: 90 | Fill #2

## 2017-02-12 DIAGNOSIS — E119 Type 2 diabetes mellitus without complications: Secondary | ICD-10-CM | POA: Diagnosis not present

## 2017-02-17 ENCOUNTER — Other Ambulatory Visit: Payer: Self-pay | Admitting: *Deleted

## 2017-02-17 DIAGNOSIS — I6523 Occlusion and stenosis of bilateral carotid arteries: Secondary | ICD-10-CM

## 2017-02-17 DIAGNOSIS — I739 Peripheral vascular disease, unspecified: Secondary | ICD-10-CM

## 2017-02-18 ENCOUNTER — Ambulatory Visit (HOSPITAL_COMMUNITY): Payer: Medicare Other

## 2017-02-24 ENCOUNTER — Ambulatory Visit (HOSPITAL_COMMUNITY)
Admission: RE | Admit: 2017-02-24 | Discharge: 2017-02-24 | Disposition: A | Discharge status: Home / Self Care | Payer: Medicare Other | Source: Ambulatory Visit | Attending: Surgery | Admitting: Surgery

## 2017-02-24 ENCOUNTER — Ambulatory Visit (HOSPITAL_BASED_OUTPATIENT_CLINIC_OR_DEPARTMENT_OTHER)
Admission: RE | Admit: 2017-02-24 | Discharge: 2017-02-24 | Disposition: A | Discharge status: Home / Self Care | Payer: Medicare Other | Source: Ambulatory Visit | Attending: Surgery | Admitting: Surgery

## 2017-02-24 DIAGNOSIS — Z95828 Presence of other vascular implants and grafts: Secondary | ICD-10-CM | POA: Insufficient documentation

## 2017-02-24 DIAGNOSIS — I739 Peripheral vascular disease, unspecified: Secondary | ICD-10-CM | POA: Diagnosis not present

## 2017-02-24 DIAGNOSIS — I6523 Occlusion and stenosis of bilateral carotid arteries: Secondary | ICD-10-CM | POA: Diagnosis not present

## 2017-03-14 MED FILL — metFORMIN HCL 500 MG TABS: 500 | 90 days supply | Qty: 360 | Fill #0

## 2017-03-18 MED FILL — PANTOPRAZOLE SOD DR 40 MG T: 40 | 90 days supply | Qty: 90 | Fill #3

## 2017-03-25 DIAGNOSIS — I10 Essential (primary) hypertension: Secondary | ICD-10-CM | POA: Diagnosis not present

## 2017-03-25 DIAGNOSIS — M859 Disorder of bone density and structure, unspecified: Secondary | ICD-10-CM | POA: Diagnosis not present

## 2017-03-25 DIAGNOSIS — E1151 Type 2 diabetes mellitus with diabetic peripheral angiopathy without gangrene: Secondary | ICD-10-CM | POA: Diagnosis not present

## 2017-03-25 DIAGNOSIS — R82998 Other abnormal findings in urine: Secondary | ICD-10-CM | POA: Diagnosis not present

## 2017-03-28 MED FILL — METOPROLOL TARTRATE 25 MG T: 25 | 90 days supply | Qty: 180 | Fill #2

## 2017-04-01 DIAGNOSIS — Z95828 Presence of other vascular implants and grafts: Secondary | ICD-10-CM | POA: Diagnosis not present

## 2017-04-01 DIAGNOSIS — E1151 Type 2 diabetes mellitus with diabetic peripheral angiopathy without gangrene: Secondary | ICD-10-CM | POA: Diagnosis not present

## 2017-04-01 DIAGNOSIS — M859 Disorder of bone density and structure, unspecified: Secondary | ICD-10-CM | POA: Diagnosis not present

## 2017-04-01 DIAGNOSIS — I350 Nonrheumatic aortic (valve) stenosis: Secondary | ICD-10-CM | POA: Diagnosis not present

## 2017-04-01 DIAGNOSIS — Z0000 Encounter for general adult medical examination without abnormal findings: Secondary | ICD-10-CM | POA: Diagnosis not present

## 2017-04-01 DIAGNOSIS — E7849 Other hyperlipidemia: Secondary | ICD-10-CM | POA: Diagnosis not present

## 2017-04-01 DIAGNOSIS — I6529 Occlusion and stenosis of unspecified carotid artery: Secondary | ICD-10-CM | POA: Diagnosis not present

## 2017-04-01 DIAGNOSIS — Z6831 Body mass index (BMI) 31.0-31.9, adult: Secondary | ICD-10-CM | POA: Diagnosis not present

## 2017-04-01 DIAGNOSIS — Z9889 Other specified postprocedural states: Secondary | ICD-10-CM | POA: Diagnosis not present

## 2017-04-01 DIAGNOSIS — I7389 Other specified peripheral vascular diseases: Secondary | ICD-10-CM | POA: Diagnosis not present

## 2017-04-01 DIAGNOSIS — Z1389 Encounter for screening for other disorder: Secondary | ICD-10-CM | POA: Diagnosis not present

## 2017-04-01 DIAGNOSIS — D696 Thrombocytopenia, unspecified: Secondary | ICD-10-CM | POA: Diagnosis not present

## 2017-04-01 MED FILL — VITAMIN D3 50000 UNIT CAPS: 1.25 MG | 84 days supply | Qty: 12 | Fill #0

## 2017-04-04 ENCOUNTER — Encounter (HOSPITAL_COMMUNITY): Payer: 59

## 2017-04-04 ENCOUNTER — Ambulatory Visit (INDEPENDENT_AMBULATORY_CARE_PROVIDER_SITE_OTHER): Payer: Medicare Other | Admitting: Surgery

## 2017-04-04 ENCOUNTER — Encounter: Payer: Self-pay | Admitting: Surgery

## 2017-04-04 ENCOUNTER — Other Ambulatory Visit: Payer: Self-pay

## 2017-04-04 VITALS — BP 137/74 | HR 70 | Temp 98.4°F | Resp 16 | Ht 63.5 in | Wt 174.0 lb

## 2017-04-04 DIAGNOSIS — I739 Peripheral vascular disease, unspecified: Secondary | ICD-10-CM | POA: Diagnosis not present

## 2017-04-04 DIAGNOSIS — I6523 Occlusion and stenosis of bilateral carotid arteries: Secondary | ICD-10-CM | POA: Diagnosis not present

## 2017-04-04 MED FILL — OLMESARTAN-HCTZ 20-12.5 MG: 20-12.5 | 90 days supply | Qty: 90 | Fill #1

## 2017-04-04 NOTE — Progress Notes (Signed)
[facility] [place]  Patient name: Paula [name] MRN: [id number] DOB: February 24, 1945 Sex: female   REASON FOR VISIT:    Follow up  HISOTRY OF PRESENT ILLNESS:    Paula [name] is a 72 y.o. female who returns today for follow up.. She is well-known to our office. She is status post right femoral to anterior tibial bypass graft by Dr. Madilyn Fireman as well as a left femoral to below knee popliteal artery bypass graft by Dr. Madilyn Fireman. These were both done for limb salvage. She is status post left carotid endarterectomy with patch angioplasty on 08/22/2015 for symptomatic left carotid stenosis. Intraoperative findings included a 90% stenosis. She is being seen today for surveillance.  She reports no interval changes   PAST MEDICAL HISTORY:   Past Medical History:  Diagnosis Date  . Anemia   . Aortic stenosis    mild AS 04/2015 echo  . Diabetes mellitus   . GERD (gastroesophageal reflux disease)   . Headache    migraines  . Heart murmur    "mild"  . Hyperlipidemia   . Hypertension   . PONV (postoperative nausea and vomiting)    "no problems since 1989"  . PVD (peripheral vascular disease) (HCC)   . Varicose veins      FAMILY HISTORY:   Family History  Problem Relation Age of Onset  . Dementia Mother   . Atrial fibrillation Mother   . Hiatal hernia Mother   . Hyperlipidemia Mother   . Hypertension Mother   . Other Mother        Varicose veins  . Varicose Veins Mother   . Heart disease Father        Before age 18, small MI  . AAA (abdominal aortic aneurysm) Father        Died age 73  . Hyperlipidemia Father     SOCIAL HISTORY:   Social History   Tobacco Use  . Smoking status: Never Smoker  . Smokeless tobacco: Never Used  Substance Use Topics  . Alcohol use: Yes    Comment: very rarely     ALLERGIES:   Allergies  Allergen Reactions  . Codeine Nausea And Vomiting    "I can take the  synthetics"     CURRENT MEDICATIONS:   Current Outpatient Medications  Medication Sig Dispense Refill  . acetaminophen (TYLENOL) 325 MG tablet Take 1-2 tablets (325-650 mg total) by mouth every 6 (six) hours as needed.    Marland Kitchen aspirin EC 325 MG tablet Take 325 mg by mouth daily.      . clopidogrel (PLAVIX) 75 MG tablet Take 75 mg by mouth daily.      Marland Kitchen ezetimibe-simvastatin (VYTORIN) 10-40 MG per tablet Take 1 tablet by mouth daily. 30 tablet 11  . ketoconazole (NIZORAL) 2 % cream Apply 1 application topically daily as needed for irritation (under the breast for yeast).    . meloxicam (MOBIC) 15 MG tablet Take 15 mg by mouth daily.    . metFORMIN (GLUCOPHAGE) 500 MG tablet Take 500-1,000 mg by mouth 2 (two) times daily with a meal. Take 2 Tabs in the morning and one at night    . metoprolol (LOPRESSOR) 50 MG tablet Take 50 mg by mouth 2 (two) times daily.    Marland Kitchen olmesartan-hydrochlorothiazide (BENICAR HCT) 20-12.5 MG per tablet Take 1 tablet by mouth daily.    . pantoprazole (PROTONIX) 40 MG tablet Take 40 mg by mouth daily.     No current  facility-administered medications for this visit.     REVIEW OF SYSTEMS:   [X]  denotes positive finding, [ ]  denotes negative finding Cardiac  Comments:  Chest pain or chest pressure:    Shortness of breath upon exertion:    Short of breath when lying flat:    Irregular heart rhythm:        Vascular    Pain in calf, thigh, or hip brought on by ambulation:    Pain in feet at night that wakes you up from your sleep:     Blood clot in your veins:    Leg swelling:         Pulmonary    Oxygen at home:    Productive cough:     Wheezing:         Neurologic    Sudden weakness in arms or legs:     Sudden numbness in arms or legs:     Sudden onset of difficulty speaking or slurred speech:    Temporary loss of vision in one eye:     Problems with dizziness:         Gastrointestinal    Blood in stool:     Vomited blood:         Genitourinary      Burning when urinating:     Blood in urine:        Psychiatric    Major depression:         Hematologic    Bleeding problems:    Problems with blood clotting too easily:        Skin    Rashes or ulcers:        Constitutional    Fever or chills:      PHYSICAL EXAM:   Vitals:   04/04/17 1121 04/04/17 1125  BP: 129/78 137/74  Pulse: 70   Resp: 16   Temp: 98.4 F (36.9 C)   TempSrc: Oral   SpO2: 100%   Weight: 174 lb (78.9 kg)   Height: 5' 3.5" (1.613 m)     GENERAL: The patient is a well-nourished female, in no acute distress. The vital signs are documented above. CARDIAC: There is a regular rate and rhythm.  VASCULAR: palpable left BPG,  Non-palpable left pedal pulses.  No carotid bruits PULMONARY: Non-labored respirations ABDOMEN: Soft and non-tender with normal pitched bowel sounds.  NEUROLOGIC: No focal weakness or paresthesias are detected. SKIN: There are no ulcers or rashes noted. PSYCHIATRIC: The patient has a normal affect.  STUDIES:   I have ordered and reviewed her vascular lab studies  Right ABI equals 0.90.  Left ABI equals 0.83  Carotid: Bilateral 40-59% stenosis  Lower extremity right: Distal right velocity equals 239 cm/s  Lower extremity left: Distal outflow velocity equals 316 cm/s  MEDICAL ISSUES:   Carotid stenosis: Follow-up ultrasound in 6 months Lower extremity: Follow-up duplex in 6 months   Durene Cal, MD [facility] [phone] Pager [phone]

## 2017-04-05 ENCOUNTER — Other Ambulatory Visit: Payer: Self-pay

## 2017-04-05 DIAGNOSIS — I739 Peripheral vascular disease, unspecified: Secondary | ICD-10-CM

## 2017-04-05 DIAGNOSIS — I6523 Occlusion and stenosis of bilateral carotid arteries: Secondary | ICD-10-CM

## 2017-04-07 DIAGNOSIS — Z1212 Encounter for screening for malignant neoplasm of rectum: Secondary | ICD-10-CM | POA: Diagnosis not present

## 2017-04-26 DIAGNOSIS — H25043 Posterior subcapsular polar age-related cataract, bilateral: Secondary | ICD-10-CM | POA: Diagnosis not present

## 2017-04-26 DIAGNOSIS — H2513 Age-related nuclear cataract, bilateral: Secondary | ICD-10-CM | POA: Diagnosis not present

## 2017-04-26 DIAGNOSIS — H25013 Cortical age-related cataract, bilateral: Secondary | ICD-10-CM | POA: Diagnosis not present

## 2017-04-26 DIAGNOSIS — H2511 Age-related nuclear cataract, right eye: Secondary | ICD-10-CM | POA: Diagnosis not present

## 2017-04-26 DIAGNOSIS — H02839 Dermatochalasis of unspecified eye, unspecified eyelid: Secondary | ICD-10-CM | POA: Diagnosis not present

## 2017-04-26 MED FILL — DUREZOL 0.05% EYE DROPS: 0.05 | 33 days supply | Qty: 5 | Fill #0

## 2017-04-26 MED FILL — BESIVANCE 0.6% SUSP: 0.6 | 33 days supply | Qty: 5 | Fill #0

## 2017-05-06 MED FILL — PROLENSA 0.07% EYE DROPS: 0.07 | 60 days supply | Qty: 3 | Fill #0

## 2017-05-23 MED FILL — CLOPIDOGREL 75 MG TABLET: 75 | 90 days supply | Qty: 90 | Fill #0

## 2017-05-25 MED FILL — EZETIMIBE-SIMVAS 10-40 MG T: 10-40 | 90 days supply | Qty: 90 | Fill #1

## 2017-06-13 MED FILL — PANTOPRAZOLE SOD DR 40 MG T: 40 | 90 days supply | Qty: 90 | Fill #0

## 2017-06-21 DIAGNOSIS — M859 Disorder of bone density and structure, unspecified: Secondary | ICD-10-CM | POA: Diagnosis not present

## 2017-06-27 DIAGNOSIS — H2511 Age-related nuclear cataract, right eye: Secondary | ICD-10-CM | POA: Diagnosis not present

## 2017-06-27 DIAGNOSIS — H2513 Age-related nuclear cataract, bilateral: Secondary | ICD-10-CM | POA: Diagnosis not present

## 2017-06-28 DIAGNOSIS — H2512 Age-related nuclear cataract, left eye: Secondary | ICD-10-CM | POA: Diagnosis not present

## 2017-06-28 MED FILL — PROLENSA 0.07% EYE DROPS: 0.07 | 30 days supply | Qty: 3 | Fill #0

## 2017-06-28 MED FILL — DUREZOL 0.05% EYE DROPS: 0.05 | 30 days supply | Qty: 5 | Fill #0

## 2017-06-28 MED FILL — BESIVANCE 0.6% SUSP: 0.6 | 30 days supply | Qty: 5 | Fill #0

## 2017-06-30 MED FILL — METOPROLOL TARTRATE 25 MG T: 25 | 90 days supply | Qty: 180 | Fill #0

## 2017-07-05 MED FILL — OLMESARTAN-HCTZ 20-12.5 MG: 20-12.5 | 30 days supply | Qty: 30 | Fill #2

## 2017-07-18 DIAGNOSIS — H2513 Age-related nuclear cataract, bilateral: Secondary | ICD-10-CM | POA: Diagnosis not present

## 2017-07-18 DIAGNOSIS — H2512 Age-related nuclear cataract, left eye: Secondary | ICD-10-CM | POA: Diagnosis not present

## 2017-07-25 MED FILL — metFORMIN HCL 500 MG TABS: 500 | 90 days supply | Qty: 360 | Fill #1

## 2017-08-17 MED FILL — OLMESARTAN-HCTZ 20-12.5 MG: 20-12.5 | 30 days supply | Qty: 30 | Fill #3

## 2017-08-17 MED FILL — CLOPIDOGREL 75 MG TABLET: 75 | 90 days supply | Qty: 90 | Fill #1

## 2017-08-17 MED FILL — EZETIMIBE-SIMVAS 10-40 MG T: 10-40 | 90 days supply | Qty: 90 | Fill #2

## 2017-09-10 MED FILL — PANTOPRAZOLE SOD DR 40 MG T: 40 | 90 days supply | Qty: 90 | Fill #1

## 2017-09-12 MED FILL — OLMESARTAN-HCTZ 20-12.5 MG: 20-12.5 | 30 days supply | Qty: 30 | Fill #4

## 2017-10-03 ENCOUNTER — Ambulatory Visit: Payer: Medicare Other | Admitting: Surgery

## 2017-10-03 ENCOUNTER — Ambulatory Visit (HOSPITAL_COMMUNITY): Admission: RE | Admit: 2017-10-03 | Payer: Medicare Other | Source: Ambulatory Visit

## 2017-10-03 ENCOUNTER — Ambulatory Visit (HOSPITAL_COMMUNITY): Payer: Medicare Other

## 2017-10-03 MED FILL — METOPROLOL TARTRATE 25 MG T: 25 | 90 days supply | Qty: 180 | Fill #1

## 2017-10-19 MED FILL — OLMESARTAN-HCTZ 20-12.5 MG: 20-12.5 | 90 days supply | Qty: 90 | Fill #0

## 2017-11-14 MED FILL — EZETIMIBE-SIMVAS 10-40 MG T: 10-40 | 90 days supply | Qty: 90 | Fill #0

## 2017-11-14 MED FILL — CLOPIDOGREL 75 MG TABLET: 75 | 90 days supply | Qty: 90 | Fill #2

## 2017-12-05 MED FILL — metFORMIN HCL 500 MG TABS: 500 | 90 days supply | Qty: 360 | Fill #0

## 2018-01-02 MED FILL — PANTOPRAZOLE SOD DR 40 MG T: 40 | 90 days supply | Qty: 90 | Fill #0

## 2018-01-10 MED FILL — METOPROLOL TARTRATE 25 MG T: 25 | 90 days supply | Qty: 180 | Fill #2

## 2018-01-10 MED FILL — OLMESARTAN-HCTZ 20-12.5 MG: 20-12.5 | 90 days supply | Qty: 90 | Fill #1

## 2018-02-13 MED FILL — EZETIMIBE-SIMVAS 10-40 MG T: 10-40 | 90 days supply | Qty: 90 | Fill #1

## 2018-02-20 MED FILL — CLOPIDOGREL 75 MG TABLET: 75 | 90 days supply | Qty: 90 | Fill #0

## 2018-03-30 ENCOUNTER — Other Ambulatory Visit: Payer: Self-pay

## 2018-03-30 ENCOUNTER — Emergency Department (HOSPITAL_BASED_OUTPATIENT_CLINIC_OR_DEPARTMENT_OTHER): Payer: Medicare Other

## 2018-03-30 ENCOUNTER — Encounter (HOSPITAL_BASED_OUTPATIENT_CLINIC_OR_DEPARTMENT_OTHER): Payer: Self-pay

## 2018-03-30 ENCOUNTER — Emergency Department (HOSPITAL_BASED_OUTPATIENT_CLINIC_OR_DEPARTMENT_OTHER)
Admission: EM | Admit: 2018-03-30 | Discharge: 2018-03-30 | Disposition: A | Discharge status: Home / Self Care | Payer: Medicare Other | Attending: Emergency Medicine | Admitting: Emergency Medicine

## 2018-03-30 DIAGNOSIS — Y999 Unspecified external cause status: Secondary | ICD-10-CM | POA: Diagnosis not present

## 2018-03-30 DIAGNOSIS — E1159 Type 2 diabetes mellitus with other circulatory complications: Secondary | ICD-10-CM | POA: Insufficient documentation

## 2018-03-30 DIAGNOSIS — R0781 Pleurodynia: Secondary | ICD-10-CM | POA: Diagnosis not present

## 2018-03-30 DIAGNOSIS — W010XXA Fall on same level from slipping, tripping and stumbling without subsequent striking against object, initial encounter: Secondary | ICD-10-CM | POA: Diagnosis not present

## 2018-03-30 DIAGNOSIS — S0012XA Contusion of left eyelid and periocular area, initial encounter: Secondary | ICD-10-CM | POA: Diagnosis not present

## 2018-03-30 DIAGNOSIS — Z7984 Long term (current) use of oral hypoglycemic drugs: Secondary | ICD-10-CM | POA: Diagnosis not present

## 2018-03-30 DIAGNOSIS — L03115 Cellulitis of right lower limb: Secondary | ICD-10-CM

## 2018-03-30 DIAGNOSIS — S2232XA Fracture of one rib, left side, initial encounter for closed fracture: Secondary | ICD-10-CM | POA: Diagnosis not present

## 2018-03-30 DIAGNOSIS — Y9301 Activity, walking, marching and hiking: Secondary | ICD-10-CM | POA: Insufficient documentation

## 2018-03-30 DIAGNOSIS — M79674 Pain in right toe(s): Secondary | ICD-10-CM | POA: Diagnosis not present

## 2018-03-30 DIAGNOSIS — Z79899 Other long term (current) drug therapy: Secondary | ICD-10-CM | POA: Insufficient documentation

## 2018-03-30 DIAGNOSIS — Z7902 Long term (current) use of antithrombotics/antiplatelets: Secondary | ICD-10-CM | POA: Diagnosis not present

## 2018-03-30 DIAGNOSIS — Y929 Unspecified place or not applicable: Secondary | ICD-10-CM | POA: Diagnosis not present

## 2018-03-30 DIAGNOSIS — L089 Local infection of the skin and subcutaneous tissue, unspecified: Secondary | ICD-10-CM | POA: Diagnosis not present

## 2018-03-30 DIAGNOSIS — Z7982 Long term (current) use of aspirin: Secondary | ICD-10-CM | POA: Insufficient documentation

## 2018-03-30 DIAGNOSIS — I10 Essential (primary) hypertension: Secondary | ICD-10-CM | POA: Insufficient documentation

## 2018-03-30 DIAGNOSIS — S299XXA Unspecified injury of thorax, initial encounter: Secondary | ICD-10-CM | POA: Diagnosis not present

## 2018-03-30 DIAGNOSIS — M7989 Other specified soft tissue disorders: Secondary | ICD-10-CM | POA: Diagnosis not present

## 2018-03-30 MED ORDER — HYDROCODONE-ACETAMINOPHEN 5-325 MG PO TABS: 1.0000 | ORAL_TABLET | Freq: Four times a day (QID) | ORAL | 0 refills | Status: AC | PRN

## 2018-03-30 MED ORDER — DOXYCYCLINE HYCLATE 100 MG PO TABS
100.0000 mg | ORAL_TABLET | Freq: Once | ORAL | Status: AC
  Administered 2018-03-30: 100 mg via ORAL
  Filled 2018-03-30: qty 1

## 2018-03-30 MED ORDER — DOXYCYCLINE HYCLATE 100 MG PO CAPS: 100.0000 mg | ORAL_CAPSULE | Freq: Two times a day (BID) | ORAL | 0 refills | Status: AC

## 2018-03-30 NOTE — ED Notes (Signed)
Pt in xray

## 2018-03-30 NOTE — ED Notes (Signed)
Patient transported to X-ray 

## 2018-03-30 NOTE — ED Triage Notes (Signed)
Pt states she clipped her right 2nd toe nail 4-5 days ago-developed "a blood blister that popped"-now has redness to top of foot and to ankle area-NAD-steady gait-pt also c/o pain to left rib area a fall 4 days ago

## 2018-03-30 NOTE — Discharge Instructions (Signed)
Take antibiotics as prescribed.  Take the entire course, even if your symptoms improve. Use the incentive spirometer 5-10 times a day to help prevent pneumonia. Follow-up with your primary care doctor at the wound care center in the next 1 to 2 days for wound recheck. Return to the emergency room if your symptoms worsen. Return if you develop fevers, cough, worsening chest pain, or any new, worsening, concerning symptoms.

## 2018-03-30 NOTE — ED Notes (Signed)
Pt resting quietly in room with eyes closed.

## 2018-03-30 NOTE — ED Provider Notes (Signed)
[facility] EMERGENCY DEPARTMENT Provider Note   CSN: [id number] Arrival date & time: 03/30/18  1850     History   Chief Complaint Chief Complaint  Patient presents with  . Foot Problem  . Fall    rt rib pain    HPI Paula [name] is a 73 y.o. female presenting for evaluation of foot pain/swelling and rib pain.  Patient states last week she was trimming her nails when she cut part of her toe.  She noticed a small blood blister, which she treated with Neosporin and a Band-Aid.  Today, patient noticed her toe was much more painful and swollen.  Swelling has extended to the entire digit and redness is extending up her leg.  Patient states this reminds her of when she had cellulitis previously.  She denies fevers, chills, nausea, vomiting.  She is not immunocompromised.  Toe was very painful, but she took some of her home Norco and had improvement of her pain.  She had tried Tylenol and ibuprofen without improvement previously. Additionally, patient states 4 days ago she had a mechanical fall in which she tripped and fell, landing on her left anterior side.  She did hit her head, but has no loss of consciousness.  She reports no headache, dizziness, vision changes, slurred speech, or other signs of head injury.  She is on Plavix.  Patient reports some continued pain of her left rib, worse with palpation.  This has improved over the past several days, but has not resolved.  She denies neck or back pain.  She denies shortness of breath, abdominal pain, urinary symptoms, normal bowel movements, numbness, or tingling.   HPI  Past Medical History:  Diagnosis Date  . Anemia   . Aortic stenosis    mild AS 04/2015 echo  . Diabetes mellitus   . GERD (gastroesophageal reflux disease)   . Headache    migraines  . Heart murmur    "mild"  . Hyperlipidemia   . Hypertension   . PONV (postoperative nausea and vomiting)    "no problems since 1989"  . PVD (peripheral vascular  disease) (HCC)   . Varicose veins     Patient Active Problem List   Diagnosis Date Noted  . Carotid stenosis 08/22/2015  . PAD (peripheral artery disease) 05/11/2013  . PVD (peripheral vascular disease) 05/11/2013  . Acute renal failure (ARF) (HCC) 04/23/2013  . Type II or unspecified type diabetes mellitus with peripheral circulatory disorders, uncontrolled(250.72) 04/23/2013  . Hypotension, unspecified 04/23/2013  . Nausea with vomiting 04/23/2013  . Diarrhea 04/23/2013  . Dehydration 04/23/2013  . Numbness or tingling-Feet 11/07/2012  . Pain in limb-Bilateral leg 11/07/2012  . Aftercare following surgery of the circulatory system, NEC 11/07/2012  . Peripheral vascular disease, unspecified (HCC) 11/07/2012  . Atherosclerosis of native arteries of the extremities with intermittent claudication 05/24/2011    Past Surgical History:  Procedure Laterality Date  . ABDOMINAL AORTAGRAM N/A 01/20/2011   Procedure: ABDOMINAL Ronny Flurry;  Surgeon: Nada Libman, MD;  Location: [facility] CATH LAB;  Service: Cardiovascular;  Laterality: N/A;  . ADENOIDECTOMY    . Anterior tibial BPG Right 2010   Fem-to- anterior Tibial  . CARPAL TUNNEL RELEASE Bilateral   . COLON RESECTION    . COLONOSCOPY    . ENDARTERECTOMY Left 08/22/2015   Procedure: ENDARTERECTOMY LEFT CAROTID ARTERY;  Surgeon: Nada Libman, MD;  Location: [facility] OR;  Service: Vascular;  Laterality: Left;  . femerol popliteal Left Jan 2007  .  FEMORAL BYPASS Left 2005  . PATCH ANGIOPLASTY Left 08/22/2015   Procedure: PATCH ANGIOPLASTY LEFT CAROTID ARTERY USING Livia Snellen BIOLOGIC PATCH;  Surgeon: Nada Libman, MD;  Location: [facility] OR;  Service: Vascular;  Laterality: Left;  . TARSAL TUNNEL RELEASE    . TONSILLECTOMY    . TUBAL LIGATION    . Vein patch Left May 2007   Left leg vein patch      OB History   No obstetric history on file.      Home Medications    Prior to Admission medications   Medication Sig Start Date End Date Taking?  Authorizing Provider  acetaminophen (TYLENOL) 325 MG tablet Take 1-2 tablets (325-650 mg total) by mouth every 6 (six) hours as needed. 08/23/15   Rhyne, Ames Coupe, PA-C  aspirin EC 325 MG tablet Take 325 mg by mouth daily.      [provider]  clopidogrel (PLAVIX) 75 MG tablet Take 75 mg by mouth daily.      [provider]  doxycycline (VIBRAMYCIN) 100 MG capsule Take 1 capsule (100 mg total) by mouth 2 (two) times daily for 7 days. 03/30/18 04/06/18  Jazzelle Zhang, PA-C  ezetimibe-simvastatin (VYTORIN) 10-40 MG per tablet Take 1 tablet by mouth daily. 04/25/13   Creola Corn, MD  HYDROcodone-acetaminophen (NORCO/VICODIN) 5-325 MG tablet Take 1 tablet by mouth every 6 (six) hours as needed for severe pain. 03/30/18   Debie Ashline, PA-C  ketoconazole (NIZORAL) 2 % cream Apply 1 application topically daily as needed for irritation (under the breast for yeast).    [provider]  meloxicam (MOBIC) 15 MG tablet Take 15 mg by mouth daily.    [provider]  metFORMIN (GLUCOPHAGE) 500 MG tablet Take 500-1,000 mg by mouth 2 (two) times daily with a meal. Take 2 Tabs in the morning and one at night    [provider]  metoprolol (LOPRESSOR) 50 MG tablet Take 50 mg by mouth 2 (two) times daily.    [provider]  olmesartan-hydrochlorothiazide (BENICAR HCT) 20-12.5 MG per tablet Take 1 tablet by mouth daily.    [provider]  pantoprazole (PROTONIX) 40 MG tablet Take 40 mg by mouth daily.    [provider]    Family History Family History  Problem Relation Age of Onset  . Dementia Mother   . Atrial fibrillation Mother   . Hiatal hernia Mother   . Hyperlipidemia Mother   . Hypertension Mother   . Other Mother        Varicose veins  . Varicose Veins Mother   . Heart disease Father        Before age 34, small MI  . AAA (abdominal aortic aneurysm) Father        Died age 54  . Hyperlipidemia Father     Social  History Social History   Tobacco Use  . Smoking status: Never Smoker  . Smokeless tobacco: Never Used  Substance Use Topics  . Alcohol use: Yes    Comment: very rarely  . Drug use: No     Allergies   Codeine   Review of Systems Review of Systems  Cardiovascular: Positive for chest pain (L rib tenderness).  Skin: Positive for color change (R toe and leg).  All other systems reviewed and are negative.    Physical Exam Updated Vital Signs BP (!) 105/57 (BP Location: Right Arm)   Pulse 95   Temp 98.7 F (37.1 C) (Oral)   Resp  16   SpO2 94%   Physical Exam Vitals signs and nursing note reviewed.  Constitutional:      General: She is not in acute distress.    Appearance: She is well-developed.     Comments: Appears nontoxic  HENT:     Head: Normocephalic.   Eyes:     Extraocular Movements: Extraocular movements intact.     Conjunctiva/sclera: Conjunctivae normal.     Pupils: Pupils are equal, round, and reactive to light.  Neck:     Musculoskeletal: Normal range of motion and neck supple.     Comments: No ttp of midline c-spine Cardiovascular:     Rate and Rhythm: Normal rate and regular rhythm.     Pulses: Normal pulses.  Pulmonary:     Effort: Pulmonary effort is normal. No respiratory distress.     Breath sounds: Normal breath sounds. No wheezing.  Chest:       Comments: Speaking in full sentences.  Clear lung sounds in all fields.  Focal tenderness of the left rib with a contusion nearby. Abdominal:     General: There is no distension.     Palpations: Abdomen is soft. There is no mass.     Tenderness: There is no abdominal tenderness. There is no guarding or rebound.  Musculoskeletal: Normal range of motion.  Skin:    General: Skin is warm and dry.     Capillary Refill: Capillary refill takes less than 2 seconds.     Comments: See pictures below.  Large dorsal blister/infection of the second right toe.  No signs of ischemia or discoloration to the  plantar aspect of the toe.  Good cap refill of the distal toe.  Pedal pulses intact.  Erythema, warmth, and swelling of the right foot and lower leg on the medial aspect.  No tenderness palpation of the lower leg.  Full active range of motion of the ankle without difficulty.  No calf pain or swelling.  Neurological:     Mental Status: She is alert and oriented to person, place, and time.          ED Treatments / Results  Labs (all labs ordered are listed, but only abnormal results are displayed) Labs Reviewed - No data to display  EKG None  Radiology Dg Ribs Unilateral W/chest Left  Result Date: 03/30/2018 CLINICAL DATA:  Left lower anterior rib pain.  Fall 4 days ago. EXAM: LEFT RIBS AND CHEST - 3+ VIEW COMPARISON:  04/23/2013 FINDINGS: Irregularity noted at the tip of the left 10th rib compatible with acute fracture. There appears to be an old posterior left 11th rib fracture with nonunion. Old lateral 6th rib fracture also noted. Lungs clear. No effusions or pneumothorax. Heart is normal size. IMPRESSION: Probable acute fracture at the tip of the left 10th rib. Old left rib fractures as above. No acute cardiopulmonary disease. Electronically Signed   By: Charlett Nose M.D.   On: 03/30/2018 21:34   Dg Foot Complete Right  Result Date: 03/30/2018 CLINICAL DATA:  Second toe pain and swelling, initial encounter EXAM: RIGHT FOOT COMPLETE - 3+ VIEW COMPARISON:  None. FINDINGS: Mild degenerative changes of the tarsal bones are seen. Calcaneal spurring is noted. No acute fracture or dislocation is noted. Soft tissue swelling of the second toe is noted consistent with the given clinical history. No underlying bony abnormality is seen. IMPRESSION: Soft tissue swelling of the second toe without acute bony abnormality. Electronically Signed   By: Eulah Pont.D.  On: 03/30/2018 21:33    Procedures Procedures (including critical care time)  Medications Ordered in ED Medications  doxycycline  (VIBRA-TABS) tablet 100 mg (100 mg Oral Given 03/30/18 2237)     Initial Impression / Assessment and Plan / ED Course  I have reviewed the triage vital signs and the nursing notes.  Pertinent labs & imaging results that were available during my care of the patient were reviewed by me and considered in my medical decision making (see chart for details).     Patient presenting for evaluation of toe swelling/pain and lower leg redness.  Physical exam shows patient who is afebrile and not tachycardic.  Appears nontoxic.  Exam of the right lower extremity concerning for infection.  Lower leg appears consistent with cellulitis.  Large blister/infection of the dorsal toe, but no signs of ischemia at this time.  Good pedal pulses and good distal cap refill.  No discoloration of the plantar foot.  Will order x-ray to rule out osteo. Additionally, patient presenting for evaluation after a fall several days ago.  She has left-sided rib pain.  Patient did hit her head, but without signs of head injury at this time.  Reassuring neuro exam.  As it has been several days, and without neuro symptoms or head pain, low suspicion for intracranial bleed.  I do not believe head CT is necessary at this time.  Will order rib x-rays for further evaluation.  X-rays viewed interpreted by me, toe x-ray without signs of osteo-.  Left rib imaging consistent with fracture.  No pneumonia, pneumothorax.  Location of fracture is consistent with exam.  Discussed findings with patient.  Will have patient use incentive spirometer, continue with pain control at home.  Case discussed with attending, Dr. Jacqulyn BathLong evaluated the patient.  Will treat for patient's foot infection with doxycycline, and close follow-up.  Referral to wound care given, patient encouraged to follow-up with her primary care doctor over the next few days for recheck.  will given short course of norco for pain control, PMP checked, no concerning rx. Stressed low threshold  for returning for any worsening symptoms.  At this time, patient appears safe for discharge.  Patient states she understands and agrees with plan.   Final Clinical Impressions(s) / ED Diagnoses   Final diagnoses:  Toe infection  Closed fracture of one rib of left side, initial encounter  Cellulitis of right lower extremity    ED Discharge Orders         Ordered    AMB referral to wound care center     03/30/18 2233    doxycycline (VIBRAMYCIN) 100 MG capsule  2 times daily     03/30/18 2234    HYDROcodone-acetaminophen (NORCO/VICODIN) 5-325 MG tablet  Every 6 hours PRN     03/30/18 2246           Alveria ApleyCaccavale, Tamela Elsayed, PA-C 03/31/18 0032    Maia PlanLong, Joshua G, MD 03/31/18 1025

## 2018-03-31 DIAGNOSIS — E1151 Type 2 diabetes mellitus with diabetic peripheral angiopathy without gangrene: Secondary | ICD-10-CM | POA: Diagnosis not present

## 2018-03-31 DIAGNOSIS — E7849 Other hyperlipidemia: Secondary | ICD-10-CM | POA: Diagnosis not present

## 2018-03-31 DIAGNOSIS — R82998 Other abnormal findings in urine: Secondary | ICD-10-CM | POA: Diagnosis not present

## 2018-03-31 DIAGNOSIS — I10 Essential (primary) hypertension: Secondary | ICD-10-CM | POA: Diagnosis not present

## 2018-03-31 DIAGNOSIS — E559 Vitamin D deficiency, unspecified: Secondary | ICD-10-CM | POA: Diagnosis not present

## 2018-03-31 MED FILL — HYDROCODON-APAP 5-325: 5-325 | 1 days supply | Qty: 6 | Fill #0

## 2018-03-31 MED FILL — DOXYCYCLINE HYCLATE 100 MG: 100 | 7 days supply | Qty: 14 | Fill #0

## 2018-04-05 ENCOUNTER — Encounter (HOSPITAL_BASED_OUTPATIENT_CLINIC_OR_DEPARTMENT_OTHER): Payer: Medicare Other | Attending: Physician Assistant

## 2018-04-05 DIAGNOSIS — E1151 Type 2 diabetes mellitus with diabetic peripheral angiopathy without gangrene: Secondary | ICD-10-CM | POA: Diagnosis not present

## 2018-04-05 DIAGNOSIS — L97512 Non-pressure chronic ulcer of other part of right foot with fat layer exposed: Secondary | ICD-10-CM | POA: Diagnosis not present

## 2018-04-05 DIAGNOSIS — I10 Essential (primary) hypertension: Secondary | ICD-10-CM | POA: Diagnosis not present

## 2018-04-05 DIAGNOSIS — E11621 Type 2 diabetes mellitus with foot ulcer: Secondary | ICD-10-CM | POA: Diagnosis not present

## 2018-04-05 MED FILL — DOXYCYCLINE HYCLATE 100 MG: 100 | 7 days supply | Qty: 14 | Fill #0

## 2018-04-05 MED FILL — SANTYL OINTMENT: 250 | 90 days supply | Qty: 90 | Fill #0

## 2018-04-07 DIAGNOSIS — Z1331 Encounter for screening for depression: Secondary | ICD-10-CM | POA: Diagnosis not present

## 2018-04-07 DIAGNOSIS — Z5189 Encounter for other specified aftercare: Secondary | ICD-10-CM | POA: Diagnosis not present

## 2018-04-07 DIAGNOSIS — E1151 Type 2 diabetes mellitus with diabetic peripheral angiopathy without gangrene: Secondary | ICD-10-CM | POA: Diagnosis not present

## 2018-04-07 DIAGNOSIS — D696 Thrombocytopenia, unspecified: Secondary | ICD-10-CM | POA: Diagnosis not present

## 2018-04-07 DIAGNOSIS — I6529 Occlusion and stenosis of unspecified carotid artery: Secondary | ICD-10-CM | POA: Diagnosis not present

## 2018-04-07 DIAGNOSIS — Z1339 Encounter for screening examination for other mental health and behavioral disorders: Secondary | ICD-10-CM | POA: Diagnosis not present

## 2018-04-07 DIAGNOSIS — Z95828 Presence of other vascular implants and grafts: Secondary | ICD-10-CM | POA: Diagnosis not present

## 2018-04-07 DIAGNOSIS — Z1212 Encounter for screening for malignant neoplasm of rectum: Secondary | ICD-10-CM | POA: Diagnosis not present

## 2018-04-07 DIAGNOSIS — M859 Disorder of bone density and structure, unspecified: Secondary | ICD-10-CM | POA: Diagnosis not present

## 2018-04-07 DIAGNOSIS — I350 Nonrheumatic aortic (valve) stenosis: Secondary | ICD-10-CM | POA: Diagnosis not present

## 2018-04-07 DIAGNOSIS — Z23 Encounter for immunization: Secondary | ICD-10-CM | POA: Diagnosis not present

## 2018-04-07 DIAGNOSIS — I739 Peripheral vascular disease, unspecified: Secondary | ICD-10-CM | POA: Diagnosis not present

## 2018-04-07 DIAGNOSIS — E559 Vitamin D deficiency, unspecified: Secondary | ICD-10-CM | POA: Diagnosis not present

## 2018-04-07 DIAGNOSIS — Z6828 Body mass index (BMI) 28.0-28.9, adult: Secondary | ICD-10-CM | POA: Diagnosis not present

## 2018-04-07 DIAGNOSIS — Z0000 Encounter for general adult medical examination without abnormal findings: Secondary | ICD-10-CM | POA: Diagnosis not present

## 2018-04-07 DIAGNOSIS — D692 Other nonthrombocytopenic purpura: Secondary | ICD-10-CM | POA: Diagnosis not present

## 2018-04-12 ENCOUNTER — Encounter (HOSPITAL_BASED_OUTPATIENT_CLINIC_OR_DEPARTMENT_OTHER): Payer: Medicare Other

## 2018-04-12 DIAGNOSIS — E1151 Type 2 diabetes mellitus with diabetic peripheral angiopathy without gangrene: Secondary | ICD-10-CM | POA: Diagnosis not present

## 2018-04-12 DIAGNOSIS — E11621 Type 2 diabetes mellitus with foot ulcer: Secondary | ICD-10-CM | POA: Diagnosis not present

## 2018-04-12 DIAGNOSIS — L97512 Non-pressure chronic ulcer of other part of right foot with fat layer exposed: Secondary | ICD-10-CM | POA: Diagnosis not present

## 2018-04-12 DIAGNOSIS — I10 Essential (primary) hypertension: Secondary | ICD-10-CM | POA: Diagnosis not present

## 2018-04-14 MED FILL — OLMESARTAN-HCTZ 20-12.5 MG: 20-12.5 | 90 days supply | Qty: 90 | Fill #0

## 2018-04-14 MED FILL — METOPROLOL TARTRATE 25 MG T: 25 | 90 days supply | Qty: 180 | Fill #0

## 2018-04-14 MED FILL — PANTOPRAZOLE SOD DR 40 MG T: 40 | 90 days supply | Qty: 90 | Fill #1

## 2018-04-26 ENCOUNTER — Encounter (HOSPITAL_BASED_OUTPATIENT_CLINIC_OR_DEPARTMENT_OTHER): Payer: Medicare Other | Attending: Internal Medicine

## 2018-04-26 DIAGNOSIS — E11621 Type 2 diabetes mellitus with foot ulcer: Secondary | ICD-10-CM | POA: Insufficient documentation

## 2018-04-26 DIAGNOSIS — L97512 Non-pressure chronic ulcer of other part of right foot with fat layer exposed: Secondary | ICD-10-CM | POA: Diagnosis not present

## 2018-04-26 DIAGNOSIS — I10 Essential (primary) hypertension: Secondary | ICD-10-CM | POA: Diagnosis not present

## 2018-04-26 DIAGNOSIS — E1151 Type 2 diabetes mellitus with diabetic peripheral angiopathy without gangrene: Secondary | ICD-10-CM | POA: Diagnosis not present

## 2018-04-26 DIAGNOSIS — Z7984 Long term (current) use of oral hypoglycemic drugs: Secondary | ICD-10-CM | POA: Insufficient documentation

## 2018-05-03 DIAGNOSIS — E1151 Type 2 diabetes mellitus with diabetic peripheral angiopathy without gangrene: Secondary | ICD-10-CM | POA: Diagnosis not present

## 2018-05-03 DIAGNOSIS — L97512 Non-pressure chronic ulcer of other part of right foot with fat layer exposed: Secondary | ICD-10-CM | POA: Diagnosis not present

## 2018-05-03 DIAGNOSIS — Z7984 Long term (current) use of oral hypoglycemic drugs: Secondary | ICD-10-CM | POA: Diagnosis not present

## 2018-05-03 DIAGNOSIS — I10 Essential (primary) hypertension: Secondary | ICD-10-CM | POA: Diagnosis not present

## 2018-05-03 DIAGNOSIS — E11621 Type 2 diabetes mellitus with foot ulcer: Secondary | ICD-10-CM | POA: Diagnosis not present

## 2018-05-04 MED FILL — metFORMIN HCL 500 MG TABS: 500 | 90 days supply | Qty: 360 | Fill #1

## 2018-05-10 DIAGNOSIS — E11621 Type 2 diabetes mellitus with foot ulcer: Secondary | ICD-10-CM | POA: Diagnosis not present

## 2018-05-10 DIAGNOSIS — Z7984 Long term (current) use of oral hypoglycemic drugs: Secondary | ICD-10-CM | POA: Diagnosis not present

## 2018-05-10 DIAGNOSIS — E1151 Type 2 diabetes mellitus with diabetic peripheral angiopathy without gangrene: Secondary | ICD-10-CM | POA: Diagnosis not present

## 2018-05-10 DIAGNOSIS — L97512 Non-pressure chronic ulcer of other part of right foot with fat layer exposed: Secondary | ICD-10-CM | POA: Diagnosis not present

## 2018-05-10 DIAGNOSIS — I10 Essential (primary) hypertension: Secondary | ICD-10-CM | POA: Diagnosis not present

## 2018-05-11 DIAGNOSIS — D649 Anemia, unspecified: Secondary | ICD-10-CM | POA: Diagnosis not present

## 2018-05-11 DIAGNOSIS — D6489 Other specified anemias: Secondary | ICD-10-CM | POA: Diagnosis not present

## 2018-05-12 MED FILL — FERREX 150 CAPSULE: 150 | 90 days supply | Qty: 90 | Fill #0

## 2018-05-17 MED FILL — EZETIMIBE-SIMVAS 10-40 MG T: 10-40 | 90 days supply | Qty: 90 | Fill #2

## 2018-05-17 MED FILL — CLOPIDOGREL 75 MG TABLET: 75 | 90 days supply | Qty: 90 | Fill #1

## 2018-05-18 ENCOUNTER — Encounter (HOSPITAL_BASED_OUTPATIENT_CLINIC_OR_DEPARTMENT_OTHER): Payer: Medicare Other | Attending: Internal Medicine

## 2018-05-18 DIAGNOSIS — I7389 Other specified peripheral vascular diseases: Secondary | ICD-10-CM | POA: Insufficient documentation

## 2018-05-18 DIAGNOSIS — I10 Essential (primary) hypertension: Secondary | ICD-10-CM | POA: Insufficient documentation

## 2018-05-18 DIAGNOSIS — E11621 Type 2 diabetes mellitus with foot ulcer: Secondary | ICD-10-CM | POA: Diagnosis not present

## 2018-05-18 DIAGNOSIS — L97512 Non-pressure chronic ulcer of other part of right foot with fat layer exposed: Secondary | ICD-10-CM | POA: Insufficient documentation

## 2018-05-22 ENCOUNTER — Encounter (HOSPITAL_COMMUNITY): Payer: Medicare Other

## 2018-05-25 ENCOUNTER — Other Ambulatory Visit (HOSPITAL_COMMUNITY): Payer: Self-pay | Admitting: *Deleted

## 2018-05-25 DIAGNOSIS — I7389 Other specified peripheral vascular diseases: Secondary | ICD-10-CM | POA: Diagnosis not present

## 2018-05-25 DIAGNOSIS — L97512 Non-pressure chronic ulcer of other part of right foot with fat layer exposed: Secondary | ICD-10-CM | POA: Diagnosis not present

## 2018-05-25 DIAGNOSIS — I10 Essential (primary) hypertension: Secondary | ICD-10-CM | POA: Diagnosis not present

## 2018-05-25 DIAGNOSIS — E11621 Type 2 diabetes mellitus with foot ulcer: Secondary | ICD-10-CM | POA: Diagnosis not present

## 2018-05-26 ENCOUNTER — Encounter (HOSPITAL_COMMUNITY)
Admission: RE | Admit: 2018-05-26 | Discharge: 2018-05-26 | Disposition: A | Discharge status: Home / Self Care | Payer: Medicare Other | Source: Ambulatory Visit | Attending: Internal Medicine | Admitting: Internal Medicine

## 2018-05-26 ENCOUNTER — Other Ambulatory Visit: Payer: Self-pay

## 2018-05-26 DIAGNOSIS — D649 Anemia, unspecified: Secondary | ICD-10-CM | POA: Insufficient documentation

## 2018-05-26 MED ORDER — SODIUM CHLORIDE 0.9 % IV SOLN
510.0000 mg | INTRAVENOUS | Status: DC
  Administered 2018-05-26: 510 mg via INTRAVENOUS
  Filled 2018-05-26: qty 510

## 2018-05-26 NOTE — Discharge Instructions (Signed)

## 2018-05-30 DIAGNOSIS — I10 Essential (primary) hypertension: Secondary | ICD-10-CM | POA: Diagnosis not present

## 2018-05-30 DIAGNOSIS — L97519 Non-pressure chronic ulcer of other part of right foot with unspecified severity: Secondary | ICD-10-CM | POA: Diagnosis not present

## 2018-05-30 DIAGNOSIS — E11621 Type 2 diabetes mellitus with foot ulcer: Secondary | ICD-10-CM | POA: Diagnosis not present

## 2018-05-30 DIAGNOSIS — L97512 Non-pressure chronic ulcer of other part of right foot with fat layer exposed: Secondary | ICD-10-CM | POA: Diagnosis not present

## 2018-05-30 DIAGNOSIS — I7389 Other specified peripheral vascular diseases: Secondary | ICD-10-CM | POA: Diagnosis not present

## 2018-06-02 ENCOUNTER — Encounter (HOSPITAL_COMMUNITY): Payer: Medicare Other

## 2018-06-05 ENCOUNTER — Other Ambulatory Visit: Payer: Self-pay

## 2018-06-05 ENCOUNTER — Encounter (HOSPITAL_COMMUNITY)
Admission: RE | Admit: 2018-06-05 | Discharge: 2018-06-05 | Disposition: A | Discharge status: Home / Self Care | Payer: Medicare Other | Source: Ambulatory Visit | Attending: Internal Medicine | Admitting: Internal Medicine

## 2018-06-05 DIAGNOSIS — D649 Anemia, unspecified: Secondary | ICD-10-CM | POA: Diagnosis not present

## 2018-06-05 MED ORDER — SODIUM CHLORIDE 0.9 % IV SOLN
510.0000 mg | INTRAVENOUS | Status: DC
  Administered 2018-06-05: 510 mg via INTRAVENOUS
  Filled 2018-06-05: qty 17
  Filled 2018-06-05: qty 510

## 2018-06-22 DIAGNOSIS — M79602 Pain in left arm: Secondary | ICD-10-CM | POA: Diagnosis not present

## 2018-06-22 DIAGNOSIS — S40812A Abrasion of left upper arm, initial encounter: Secondary | ICD-10-CM | POA: Diagnosis not present

## 2018-06-26 DIAGNOSIS — M79602 Pain in left arm: Secondary | ICD-10-CM | POA: Diagnosis not present

## 2018-06-26 DIAGNOSIS — S40812A Abrasion of left upper arm, initial encounter: Secondary | ICD-10-CM | POA: Diagnosis not present

## 2018-07-03 DIAGNOSIS — S40812A Abrasion of left upper arm, initial encounter: Secondary | ICD-10-CM | POA: Diagnosis not present

## 2018-07-03 DIAGNOSIS — M79602 Pain in left arm: Secondary | ICD-10-CM | POA: Diagnosis not present

## 2018-07-21 MED FILL — PANTOPRAZOLE SOD DR 40 MG T: 40 | 90 days supply | Qty: 90 | Fill #0

## 2018-07-21 MED FILL — METOPROLOL TARTRATE 25 MG T: 25 | 90 days supply | Qty: 180 | Fill #1

## 2018-07-24 MED FILL — OLMESARTAN-HCTZ 20-12.5 MG: 20-12.5 | 30 days supply | Qty: 30 | Fill #1

## 2018-08-01 DIAGNOSIS — R600 Localized edema: Secondary | ICD-10-CM | POA: Diagnosis not present

## 2018-08-01 DIAGNOSIS — L03116 Cellulitis of left lower limb: Secondary | ICD-10-CM | POA: Diagnosis not present

## 2018-08-08 DIAGNOSIS — R600 Localized edema: Secondary | ICD-10-CM | POA: Diagnosis not present

## 2018-08-08 DIAGNOSIS — L03116 Cellulitis of left lower limb: Secondary | ICD-10-CM | POA: Diagnosis not present

## 2018-08-08 DIAGNOSIS — L299 Pruritus, unspecified: Secondary | ICD-10-CM | POA: Diagnosis not present

## 2018-08-08 MED FILL — MUPIROCIN 2% OINTMENT: 2 | 7 days supply | Qty: 22 | Fill #0

## 2018-08-08 MED FILL — HYDROCORTISONE 2.5% CREAM: 2.5 | 7 days supply | Qty: 30 | Fill #0

## 2018-08-13 DIAGNOSIS — Z20828 Contact with and (suspected) exposure to other viral communicable diseases: Secondary | ICD-10-CM | POA: Diagnosis not present

## 2018-08-16 MED FILL — EZETIMIBE-SIMVASTATIN 10-40: 10-40 | 90 days supply | Qty: 90 | Fill #0

## 2018-08-16 MED FILL — CLOPIDOGREL 75 MG TABLET: 75 | 90 days supply | Qty: 90 | Fill #0

## 2018-08-17 DIAGNOSIS — S20211A Contusion of right front wall of thorax, initial encounter: Secondary | ICD-10-CM | POA: Diagnosis not present

## 2018-08-17 DIAGNOSIS — R109 Unspecified abdominal pain: Secondary | ICD-10-CM | POA: Diagnosis not present

## 2018-08-17 DIAGNOSIS — S52501A Unspecified fracture of the lower end of right radius, initial encounter for closed fracture: Secondary | ICD-10-CM | POA: Diagnosis not present

## 2018-08-17 DIAGNOSIS — M654 Radial styloid tenosynovitis [de Quervain]: Secondary | ICD-10-CM | POA: Diagnosis not present

## 2018-08-17 DIAGNOSIS — M25531 Pain in right wrist: Secondary | ICD-10-CM | POA: Diagnosis not present

## 2018-08-17 DIAGNOSIS — S66911A Strain of unspecified muscle, fascia and tendon at wrist and hand level, right hand, initial encounter: Secondary | ICD-10-CM | POA: Diagnosis not present

## 2018-08-17 MED FILL — MELOXICAM 7.5 MG TABLET: 7.5 | 14 days supply | Qty: 14 | Fill #0

## 2018-08-23 DIAGNOSIS — M25531 Pain in right wrist: Secondary | ICD-10-CM | POA: Diagnosis not present

## 2018-08-23 DIAGNOSIS — S62101S Fracture of unspecified carpal bone, right wrist, sequela: Secondary | ICD-10-CM | POA: Diagnosis not present

## 2018-08-23 DIAGNOSIS — S20211D Contusion of right front wall of thorax, subsequent encounter: Secondary | ICD-10-CM | POA: Diagnosis not present

## 2018-08-23 MED FILL — DICLOFENAC SODIUM 75 MG TAB: 75 | 14 days supply | Qty: 28 | Fill #0

## 2018-08-31 MED FILL — OLMESARTAN-HCTZ 20-12.5 MG: 20-12.5 | 30 days supply | Qty: 30 | Fill #2

## 2018-09-07 DIAGNOSIS — S52501D Unspecified fracture of the lower end of right radius, subsequent encounter for closed fracture with routine healing: Secondary | ICD-10-CM | POA: Diagnosis not present

## 2018-09-27 MED FILL — OLMESARTAN-HCTZ 20-12.5 MG: 20-12.5 | 30 days supply | Qty: 30 | Fill #3

## 2018-10-20 DIAGNOSIS — D649 Anemia, unspecified: Secondary | ICD-10-CM | POA: Diagnosis not present

## 2018-10-20 DIAGNOSIS — Z5189 Encounter for other specified aftercare: Secondary | ICD-10-CM | POA: Diagnosis not present

## 2018-10-20 DIAGNOSIS — M25531 Pain in right wrist: Secondary | ICD-10-CM | POA: Diagnosis not present

## 2018-10-20 DIAGNOSIS — Z23 Encounter for immunization: Secondary | ICD-10-CM | POA: Diagnosis not present

## 2018-10-20 DIAGNOSIS — D696 Thrombocytopenia, unspecified: Secondary | ICD-10-CM | POA: Diagnosis not present

## 2018-10-20 DIAGNOSIS — I350 Nonrheumatic aortic (valve) stenosis: Secondary | ICD-10-CM | POA: Diagnosis not present

## 2018-10-20 DIAGNOSIS — E785 Hyperlipidemia, unspecified: Secondary | ICD-10-CM | POA: Diagnosis not present

## 2018-10-20 DIAGNOSIS — I6529 Occlusion and stenosis of unspecified carotid artery: Secondary | ICD-10-CM | POA: Diagnosis not present

## 2018-10-20 DIAGNOSIS — M8580 Other specified disorders of bone density and structure, unspecified site: Secondary | ICD-10-CM | POA: Diagnosis not present

## 2018-10-20 DIAGNOSIS — E1151 Type 2 diabetes mellitus with diabetic peripheral angiopathy without gangrene: Secondary | ICD-10-CM | POA: Diagnosis not present

## 2018-10-20 DIAGNOSIS — I739 Peripheral vascular disease, unspecified: Secondary | ICD-10-CM | POA: Diagnosis not present

## 2018-10-20 DIAGNOSIS — D692 Other nonthrombocytopenic purpura: Secondary | ICD-10-CM | POA: Diagnosis not present

## 2018-10-20 DIAGNOSIS — E559 Vitamin D deficiency, unspecified: Secondary | ICD-10-CM | POA: Diagnosis not present

## 2018-10-20 DIAGNOSIS — E7849 Other hyperlipidemia: Secondary | ICD-10-CM | POA: Diagnosis not present

## 2018-10-20 MED FILL — PANTOPRAZOLE SOD DR 40 MG T: 40 | 90 days supply | Qty: 90 | Fill #1

## 2018-10-20 MED FILL — KETOCONAZOLE 2% CREAM: 2 | 30 days supply | Qty: 60 | Fill #0

## 2018-10-20 MED FILL — OLMESARTAN-HCTZ 20-12.5 MG: 20-12.5 | 30 days supply | Qty: 30 | Fill #4

## 2018-10-20 MED FILL — METOPROLOL TARTRATE 25 MG T: 25 | 90 days supply | Qty: 180 | Fill #2

## 2018-11-14 DIAGNOSIS — S80862A Insect bite (nonvenomous), left lower leg, initial encounter: Secondary | ICD-10-CM | POA: Diagnosis not present

## 2018-11-14 DIAGNOSIS — W57XXXA Bitten or stung by nonvenomous insect and other nonvenomous arthropods, initial encounter: Secondary | ICD-10-CM | POA: Diagnosis not present

## 2018-11-14 DIAGNOSIS — Z1159 Encounter for screening for other viral diseases: Secondary | ICD-10-CM | POA: Diagnosis not present

## 2018-11-14 DIAGNOSIS — E119 Type 2 diabetes mellitus without complications: Secondary | ICD-10-CM | POA: Diagnosis not present

## 2018-11-14 DIAGNOSIS — L03116 Cellulitis of left lower limb: Secondary | ICD-10-CM | POA: Diagnosis not present

## 2018-11-24 DIAGNOSIS — S80862D Insect bite (nonvenomous), left lower leg, subsequent encounter: Secondary | ICD-10-CM | POA: Diagnosis not present

## 2018-11-24 DIAGNOSIS — W57XXXD Bitten or stung by nonvenomous insect and other nonvenomous arthropods, subsequent encounter: Secondary | ICD-10-CM | POA: Diagnosis not present

## 2018-11-24 DIAGNOSIS — L089 Local infection of the skin and subcutaneous tissue, unspecified: Secondary | ICD-10-CM | POA: Diagnosis not present

## 2018-11-24 DIAGNOSIS — M79641 Pain in right hand: Secondary | ICD-10-CM | POA: Diagnosis not present

## 2018-11-25 MED FILL — OLMESARTAN-HCTZ 20-12.5 MG: 20-12.5 | 30 days supply | Qty: 30 | Fill #5

## 2018-11-25 MED FILL — CLOPIDOGREL 75 MG TABLET: 75 | 90 days supply | Qty: 90 | Fill #1

## 2018-11-25 MED FILL — EZETIMIBE-SIMVASTATIN 10-40: 10-40 | 90 days supply | Qty: 90 | Fill #1

## 2018-12-08 DIAGNOSIS — L089 Local infection of the skin and subcutaneous tissue, unspecified: Secondary | ICD-10-CM | POA: Diagnosis not present

## 2018-12-08 DIAGNOSIS — W57XXXD Bitten or stung by nonvenomous insect and other nonvenomous arthropods, subsequent encounter: Secondary | ICD-10-CM | POA: Diagnosis not present

## 2018-12-08 DIAGNOSIS — S80862D Insect bite (nonvenomous), left lower leg, subsequent encounter: Secondary | ICD-10-CM | POA: Diagnosis not present

## 2018-12-08 DIAGNOSIS — M19031 Primary osteoarthritis, right wrist: Secondary | ICD-10-CM | POA: Diagnosis not present

## 2019-01-01 MED FILL — HYDROCORTISONE 2.5% CREAM: 2.5 | 7 days supply | Qty: 30 | Fill #1

## 2019-01-01 MED FILL — OLMESARTAN-HCTZ 20-12.5 MG: 20-12.5 | 30 days supply | Qty: 30 | Fill #6

## 2019-01-02 DIAGNOSIS — M25512 Pain in left shoulder: Secondary | ICD-10-CM | POA: Diagnosis not present

## 2019-03-01 DIAGNOSIS — L03115 Cellulitis of right lower limb: Secondary | ICD-10-CM | POA: Diagnosis not present

## 2019-03-07 DIAGNOSIS — I10 Essential (primary) hypertension: Secondary | ICD-10-CM | POA: Diagnosis not present

## 2019-03-07 DIAGNOSIS — D509 Iron deficiency anemia, unspecified: Secondary | ICD-10-CM | POA: Diagnosis not present

## 2019-03-07 DIAGNOSIS — L03115 Cellulitis of right lower limb: Secondary | ICD-10-CM | POA: Diagnosis not present

## 2019-03-07 DIAGNOSIS — E1151 Type 2 diabetes mellitus with diabetic peripheral angiopathy without gangrene: Secondary | ICD-10-CM | POA: Diagnosis not present

## 2019-03-07 DIAGNOSIS — Z1211 Encounter for screening for malignant neoplasm of colon: Secondary | ICD-10-CM | POA: Diagnosis not present

## 2019-03-07 DIAGNOSIS — I350 Nonrheumatic aortic (valve) stenosis: Secondary | ICD-10-CM | POA: Diagnosis not present

## 2019-03-07 DIAGNOSIS — I739 Peripheral vascular disease, unspecified: Secondary | ICD-10-CM | POA: Diagnosis not present

## 2019-03-07 DIAGNOSIS — I70219 Atherosclerosis of native arteries of extremities with intermittent claudication, unspecified extremity: Secondary | ICD-10-CM | POA: Diagnosis not present

## 2019-03-07 DIAGNOSIS — K219 Gastro-esophageal reflux disease without esophagitis: Secondary | ICD-10-CM | POA: Diagnosis not present

## 2019-03-07 DIAGNOSIS — M25512 Pain in left shoulder: Secondary | ICD-10-CM | POA: Diagnosis not present

## 2019-03-07 DIAGNOSIS — I6523 Occlusion and stenosis of bilateral carotid arteries: Secondary | ICD-10-CM | POA: Diagnosis not present

## 2019-03-07 DIAGNOSIS — Z1231 Encounter for screening mammogram for malignant neoplasm of breast: Secondary | ICD-10-CM | POA: Diagnosis not present

## 2019-03-09 DIAGNOSIS — Z1231 Encounter for screening mammogram for malignant neoplasm of breast: Secondary | ICD-10-CM | POA: Diagnosis not present

## 2019-03-17 DIAGNOSIS — Z23 Encounter for immunization: Secondary | ICD-10-CM | POA: Diagnosis not present

## 2019-03-20 DIAGNOSIS — S46012A Strain of muscle(s) and tendon(s) of the rotator cuff of left shoulder, initial encounter: Secondary | ICD-10-CM | POA: Diagnosis not present

## 2019-03-20 DIAGNOSIS — M25512 Pain in left shoulder: Secondary | ICD-10-CM | POA: Diagnosis not present

## 2019-03-24 DIAGNOSIS — S46012A Strain of muscle(s) and tendon(s) of the rotator cuff of left shoulder, initial encounter: Secondary | ICD-10-CM | POA: Diagnosis not present

## 2019-03-24 DIAGNOSIS — S43492A Other sprain of left shoulder joint, initial encounter: Secondary | ICD-10-CM | POA: Diagnosis not present

## 2019-03-27 DIAGNOSIS — S46012D Strain of muscle(s) and tendon(s) of the rotator cuff of left shoulder, subsequent encounter: Secondary | ICD-10-CM | POA: Diagnosis not present

## 2019-03-27 DIAGNOSIS — Z7902 Long term (current) use of antithrombotics/antiplatelets: Secondary | ICD-10-CM | POA: Diagnosis not present

## 2019-03-27 DIAGNOSIS — Z8679 Personal history of other diseases of the circulatory system: Secondary | ICD-10-CM | POA: Diagnosis not present

## 2019-03-27 DIAGNOSIS — Z7982 Long term (current) use of aspirin: Secondary | ICD-10-CM | POA: Diagnosis not present

## 2019-03-27 DIAGNOSIS — M75122 Complete rotator cuff tear or rupture of left shoulder, not specified as traumatic: Secondary | ICD-10-CM | POA: Diagnosis not present

## 2019-04-07 DIAGNOSIS — Z23 Encounter for immunization: Secondary | ICD-10-CM | POA: Diagnosis not present

## 2019-04-11 DIAGNOSIS — I70219 Atherosclerosis of native arteries of extremities with intermittent claudication, unspecified extremity: Secondary | ICD-10-CM | POA: Diagnosis not present

## 2019-04-11 DIAGNOSIS — I739 Peripheral vascular disease, unspecified: Secondary | ICD-10-CM | POA: Diagnosis not present

## 2019-04-11 DIAGNOSIS — Z8679 Personal history of other diseases of the circulatory system: Secondary | ICD-10-CM | POA: Diagnosis not present

## 2019-04-11 DIAGNOSIS — Z9889 Other specified postprocedural states: Secondary | ICD-10-CM | POA: Diagnosis not present

## 2019-04-11 DIAGNOSIS — Z95820 Peripheral vascular angioplasty status with implants and grafts: Secondary | ICD-10-CM | POA: Diagnosis not present

## 2019-04-17 DIAGNOSIS — S46012A Strain of muscle(s) and tendon(s) of the rotator cuff of left shoulder, initial encounter: Secondary | ICD-10-CM | POA: Diagnosis not present

## 2019-04-17 DIAGNOSIS — Z20822 Contact with and (suspected) exposure to covid-19: Secondary | ICD-10-CM | POA: Diagnosis not present

## 2019-04-17 DIAGNOSIS — Z01812 Encounter for preprocedural laboratory examination: Secondary | ICD-10-CM | POA: Diagnosis not present

## 2019-04-19 DIAGNOSIS — I350 Nonrheumatic aortic (valve) stenosis: Secondary | ICD-10-CM | POA: Diagnosis not present

## 2019-04-19 DIAGNOSIS — I501 Left ventricular failure: Secondary | ICD-10-CM | POA: Diagnosis not present

## 2019-04-24 DIAGNOSIS — L03115 Cellulitis of right lower limb: Secondary | ICD-10-CM | POA: Diagnosis not present

## 2019-05-07 DIAGNOSIS — Z95828 Presence of other vascular implants and grafts: Secondary | ICD-10-CM | POA: Diagnosis not present

## 2019-05-07 DIAGNOSIS — I350 Nonrheumatic aortic (valve) stenosis: Secondary | ICD-10-CM | POA: Diagnosis not present

## 2019-05-07 DIAGNOSIS — S46012D Strain of muscle(s) and tendon(s) of the rotator cuff of left shoulder, subsequent encounter: Secondary | ICD-10-CM | POA: Diagnosis not present

## 2019-05-07 DIAGNOSIS — Z01810 Encounter for preprocedural cardiovascular examination: Secondary | ICD-10-CM | POA: Diagnosis not present

## 2019-05-07 DIAGNOSIS — I739 Peripheral vascular disease, unspecified: Secondary | ICD-10-CM | POA: Diagnosis not present

## 2019-05-07 DIAGNOSIS — Z885 Allergy status to narcotic agent status: Secondary | ICD-10-CM | POA: Diagnosis not present

## 2019-05-11 DIAGNOSIS — I350 Nonrheumatic aortic (valve) stenosis: Secondary | ICD-10-CM | POA: Diagnosis not present

## 2019-05-25 DIAGNOSIS — R928 Other abnormal and inconclusive findings on diagnostic imaging of breast: Secondary | ICD-10-CM | POA: Diagnosis not present

## 2019-06-05 DIAGNOSIS — Z0000 Encounter for general adult medical examination without abnormal findings: Secondary | ICD-10-CM | POA: Diagnosis not present

## 2019-06-05 DIAGNOSIS — E1151 Type 2 diabetes mellitus with diabetic peripheral angiopathy without gangrene: Secondary | ICD-10-CM | POA: Diagnosis not present

## 2019-06-05 DIAGNOSIS — D509 Iron deficiency anemia, unspecified: Secondary | ICD-10-CM | POA: Diagnosis not present

## 2019-06-05 DIAGNOSIS — I350 Nonrheumatic aortic (valve) stenosis: Secondary | ICD-10-CM | POA: Diagnosis not present

## 2019-06-05 DIAGNOSIS — R296 Repeated falls: Secondary | ICD-10-CM | POA: Diagnosis not present

## 2019-06-21 DIAGNOSIS — M65812 Other synovitis and tenosynovitis, left shoulder: Secondary | ICD-10-CM | POA: Diagnosis not present

## 2019-06-21 DIAGNOSIS — D509 Iron deficiency anemia, unspecified: Secondary | ICD-10-CM | POA: Diagnosis not present

## 2019-06-21 DIAGNOSIS — I350 Nonrheumatic aortic (valve) stenosis: Secondary | ICD-10-CM | POA: Diagnosis not present

## 2019-06-21 DIAGNOSIS — M7542 Impingement syndrome of left shoulder: Secondary | ICD-10-CM | POA: Diagnosis not present

## 2019-06-21 DIAGNOSIS — D696 Thrombocytopenia, unspecified: Secondary | ICD-10-CM | POA: Diagnosis not present

## 2019-06-21 DIAGNOSIS — S46012A Strain of muscle(s) and tendon(s) of the rotator cuff of left shoulder, initial encounter: Secondary | ICD-10-CM | POA: Diagnosis not present

## 2019-06-21 DIAGNOSIS — M94212 Chondromalacia, left shoulder: Secondary | ICD-10-CM | POA: Diagnosis not present

## 2019-06-21 DIAGNOSIS — E1122 Type 2 diabetes mellitus with diabetic chronic kidney disease: Secondary | ICD-10-CM | POA: Diagnosis not present

## 2019-06-21 DIAGNOSIS — Z7982 Long term (current) use of aspirin: Secondary | ICD-10-CM | POA: Diagnosis not present

## 2019-06-21 DIAGNOSIS — N1831 Chronic kidney disease, stage 3a: Secondary | ICD-10-CM | POA: Diagnosis not present

## 2019-06-21 DIAGNOSIS — Z7902 Long term (current) use of antithrombotics/antiplatelets: Secondary | ICD-10-CM | POA: Diagnosis not present

## 2019-06-21 DIAGNOSIS — M25712 Osteophyte, left shoulder: Secondary | ICD-10-CM | POA: Diagnosis not present

## 2019-06-21 DIAGNOSIS — G8918 Other acute postprocedural pain: Secondary | ICD-10-CM | POA: Diagnosis not present

## 2019-06-21 DIAGNOSIS — M75122 Complete rotator cuff tear or rupture of left shoulder, not specified as traumatic: Secondary | ICD-10-CM | POA: Diagnosis not present

## 2019-06-21 DIAGNOSIS — I129 Hypertensive chronic kidney disease with stage 1 through stage 4 chronic kidney disease, or unspecified chronic kidney disease: Secondary | ICD-10-CM | POA: Diagnosis not present

## 2019-06-21 DIAGNOSIS — K219 Gastro-esophageal reflux disease without esophagitis: Secondary | ICD-10-CM | POA: Diagnosis not present

## 2019-06-21 DIAGNOSIS — Z79899 Other long term (current) drug therapy: Secondary | ICD-10-CM | POA: Diagnosis not present

## 2019-06-21 DIAGNOSIS — M25812 Other specified joint disorders, left shoulder: Secondary | ICD-10-CM | POA: Diagnosis not present

## 2019-06-29 DIAGNOSIS — Z4789 Encounter for other orthopedic aftercare: Secondary | ICD-10-CM | POA: Diagnosis not present

## 2019-08-21 DIAGNOSIS — S46012D Strain of muscle(s) and tendon(s) of the rotator cuff of left shoulder, subsequent encounter: Secondary | ICD-10-CM | POA: Diagnosis not present

## 2019-08-21 DIAGNOSIS — Z7409 Other reduced mobility: Secondary | ICD-10-CM | POA: Diagnosis not present

## 2019-08-21 DIAGNOSIS — M25612 Stiffness of left shoulder, not elsewhere classified: Secondary | ICD-10-CM | POA: Diagnosis not present

## 2019-08-21 DIAGNOSIS — Z4789 Encounter for other orthopedic aftercare: Secondary | ICD-10-CM | POA: Diagnosis not present

## 2019-08-21 DIAGNOSIS — R531 Weakness: Secondary | ICD-10-CM | POA: Diagnosis not present

## 2019-08-24 DIAGNOSIS — R531 Weakness: Secondary | ICD-10-CM | POA: Diagnosis not present

## 2019-08-24 DIAGNOSIS — Z4789 Encounter for other orthopedic aftercare: Secondary | ICD-10-CM | POA: Diagnosis not present

## 2019-08-24 DIAGNOSIS — Z7409 Other reduced mobility: Secondary | ICD-10-CM | POA: Diagnosis not present

## 2019-08-24 DIAGNOSIS — M25612 Stiffness of left shoulder, not elsewhere classified: Secondary | ICD-10-CM | POA: Diagnosis not present

## 2019-08-24 DIAGNOSIS — S46012D Strain of muscle(s) and tendon(s) of the rotator cuff of left shoulder, subsequent encounter: Secondary | ICD-10-CM | POA: Diagnosis not present

## 2019-08-27 DIAGNOSIS — Z7409 Other reduced mobility: Secondary | ICD-10-CM | POA: Diagnosis not present

## 2019-08-27 DIAGNOSIS — S46012D Strain of muscle(s) and tendon(s) of the rotator cuff of left shoulder, subsequent encounter: Secondary | ICD-10-CM | POA: Diagnosis not present

## 2019-08-27 DIAGNOSIS — Z4789 Encounter for other orthopedic aftercare: Secondary | ICD-10-CM | POA: Diagnosis not present

## 2019-08-27 DIAGNOSIS — R531 Weakness: Secondary | ICD-10-CM | POA: Diagnosis not present

## 2019-08-27 DIAGNOSIS — M25612 Stiffness of left shoulder, not elsewhere classified: Secondary | ICD-10-CM | POA: Diagnosis not present

## 2019-08-29 DIAGNOSIS — Z4789 Encounter for other orthopedic aftercare: Secondary | ICD-10-CM | POA: Diagnosis not present

## 2019-08-29 DIAGNOSIS — R531 Weakness: Secondary | ICD-10-CM | POA: Diagnosis not present

## 2019-08-29 DIAGNOSIS — S46012D Strain of muscle(s) and tendon(s) of the rotator cuff of left shoulder, subsequent encounter: Secondary | ICD-10-CM | POA: Diagnosis not present

## 2019-08-29 DIAGNOSIS — M25612 Stiffness of left shoulder, not elsewhere classified: Secondary | ICD-10-CM | POA: Diagnosis not present

## 2019-08-29 DIAGNOSIS — Z7409 Other reduced mobility: Secondary | ICD-10-CM | POA: Diagnosis not present

## 2019-08-31 DIAGNOSIS — Z9889 Other specified postprocedural states: Secondary | ICD-10-CM | POA: Diagnosis not present

## 2019-09-03 DIAGNOSIS — I350 Nonrheumatic aortic (valve) stenosis: Secondary | ICD-10-CM | POA: Diagnosis not present

## 2019-09-03 DIAGNOSIS — Z9889 Other specified postprocedural states: Secondary | ICD-10-CM | POA: Diagnosis not present

## 2019-09-03 DIAGNOSIS — E1151 Type 2 diabetes mellitus with diabetic peripheral angiopathy without gangrene: Secondary | ICD-10-CM | POA: Diagnosis not present

## 2019-09-03 DIAGNOSIS — E119 Type 2 diabetes mellitus without complications: Secondary | ICD-10-CM | POA: Diagnosis not present

## 2019-09-03 DIAGNOSIS — I739 Peripheral vascular disease, unspecified: Secondary | ICD-10-CM | POA: Diagnosis not present

## 2019-09-05 DIAGNOSIS — Z4789 Encounter for other orthopedic aftercare: Secondary | ICD-10-CM | POA: Diagnosis not present

## 2019-09-05 DIAGNOSIS — M25612 Stiffness of left shoulder, not elsewhere classified: Secondary | ICD-10-CM | POA: Diagnosis not present

## 2019-09-05 DIAGNOSIS — R531 Weakness: Secondary | ICD-10-CM | POA: Diagnosis not present

## 2019-09-05 DIAGNOSIS — S46012D Strain of muscle(s) and tendon(s) of the rotator cuff of left shoulder, subsequent encounter: Secondary | ICD-10-CM | POA: Diagnosis not present

## 2019-09-05 DIAGNOSIS — Z7409 Other reduced mobility: Secondary | ICD-10-CM | POA: Diagnosis not present

## 2019-09-06 DIAGNOSIS — M25612 Stiffness of left shoulder, not elsewhere classified: Secondary | ICD-10-CM | POA: Diagnosis not present

## 2019-09-06 DIAGNOSIS — R531 Weakness: Secondary | ICD-10-CM | POA: Diagnosis not present

## 2019-09-06 DIAGNOSIS — S46012D Strain of muscle(s) and tendon(s) of the rotator cuff of left shoulder, subsequent encounter: Secondary | ICD-10-CM | POA: Diagnosis not present

## 2019-09-06 DIAGNOSIS — Z4789 Encounter for other orthopedic aftercare: Secondary | ICD-10-CM | POA: Diagnosis not present

## 2019-09-06 DIAGNOSIS — Z7409 Other reduced mobility: Secondary | ICD-10-CM | POA: Diagnosis not present

## 2019-09-11 DIAGNOSIS — Z4789 Encounter for other orthopedic aftercare: Secondary | ICD-10-CM | POA: Diagnosis not present

## 2019-09-11 DIAGNOSIS — Z7409 Other reduced mobility: Secondary | ICD-10-CM | POA: Diagnosis not present

## 2019-09-11 DIAGNOSIS — S46012D Strain of muscle(s) and tendon(s) of the rotator cuff of left shoulder, subsequent encounter: Secondary | ICD-10-CM | POA: Diagnosis not present

## 2019-09-11 DIAGNOSIS — M25612 Stiffness of left shoulder, not elsewhere classified: Secondary | ICD-10-CM | POA: Diagnosis not present

## 2019-09-11 DIAGNOSIS — R531 Weakness: Secondary | ICD-10-CM | POA: Diagnosis not present

## 2019-09-14 DIAGNOSIS — Z7409 Other reduced mobility: Secondary | ICD-10-CM | POA: Diagnosis not present

## 2019-09-14 DIAGNOSIS — S46012D Strain of muscle(s) and tendon(s) of the rotator cuff of left shoulder, subsequent encounter: Secondary | ICD-10-CM | POA: Diagnosis not present

## 2019-09-14 DIAGNOSIS — R531 Weakness: Secondary | ICD-10-CM | POA: Diagnosis not present

## 2019-09-14 DIAGNOSIS — M25612 Stiffness of left shoulder, not elsewhere classified: Secondary | ICD-10-CM | POA: Diagnosis not present

## 2019-09-14 DIAGNOSIS — Z4789 Encounter for other orthopedic aftercare: Secondary | ICD-10-CM | POA: Diagnosis not present

## 2019-09-18 DIAGNOSIS — I350 Nonrheumatic aortic (valve) stenosis: Secondary | ICD-10-CM | POA: Diagnosis not present

## 2019-09-18 DIAGNOSIS — I2510 Atherosclerotic heart disease of native coronary artery without angina pectoris: Secondary | ICD-10-CM | POA: Diagnosis not present

## 2019-09-18 DIAGNOSIS — I083 Combined rheumatic disorders of mitral, aortic and tricuspid valves: Secondary | ICD-10-CM | POA: Diagnosis not present

## 2019-09-20 DIAGNOSIS — I350 Nonrheumatic aortic (valve) stenosis: Secondary | ICD-10-CM | POA: Diagnosis not present

## 2019-09-20 DIAGNOSIS — Z01810 Encounter for preprocedural cardiovascular examination: Secondary | ICD-10-CM | POA: Diagnosis not present

## 2019-09-25 DIAGNOSIS — Z7409 Other reduced mobility: Secondary | ICD-10-CM | POA: Diagnosis not present

## 2019-09-25 DIAGNOSIS — Z4789 Encounter for other orthopedic aftercare: Secondary | ICD-10-CM | POA: Diagnosis not present

## 2019-09-25 DIAGNOSIS — R531 Weakness: Secondary | ICD-10-CM | POA: Diagnosis not present

## 2019-09-25 DIAGNOSIS — M25612 Stiffness of left shoulder, not elsewhere classified: Secondary | ICD-10-CM | POA: Diagnosis not present

## 2019-09-27 DIAGNOSIS — R531 Weakness: Secondary | ICD-10-CM | POA: Diagnosis not present

## 2019-09-27 DIAGNOSIS — Z7409 Other reduced mobility: Secondary | ICD-10-CM | POA: Diagnosis not present

## 2019-09-27 DIAGNOSIS — M25612 Stiffness of left shoulder, not elsewhere classified: Secondary | ICD-10-CM | POA: Diagnosis not present

## 2019-09-27 DIAGNOSIS — Z4789 Encounter for other orthopedic aftercare: Secondary | ICD-10-CM | POA: Diagnosis not present

## 2019-10-02 DIAGNOSIS — Z4789 Encounter for other orthopedic aftercare: Secondary | ICD-10-CM | POA: Diagnosis not present

## 2019-10-02 DIAGNOSIS — R531 Weakness: Secondary | ICD-10-CM | POA: Diagnosis not present

## 2019-10-02 DIAGNOSIS — Z7409 Other reduced mobility: Secondary | ICD-10-CM | POA: Diagnosis not present

## 2019-10-02 DIAGNOSIS — M25612 Stiffness of left shoulder, not elsewhere classified: Secondary | ICD-10-CM | POA: Diagnosis not present

## 2019-10-04 DIAGNOSIS — M25612 Stiffness of left shoulder, not elsewhere classified: Secondary | ICD-10-CM | POA: Diagnosis not present

## 2019-10-04 DIAGNOSIS — R531 Weakness: Secondary | ICD-10-CM | POA: Diagnosis not present

## 2019-10-04 DIAGNOSIS — Z4789 Encounter for other orthopedic aftercare: Secondary | ICD-10-CM | POA: Diagnosis not present

## 2019-10-04 DIAGNOSIS — Z7409 Other reduced mobility: Secondary | ICD-10-CM | POA: Diagnosis not present

## 2019-10-09 DIAGNOSIS — M25612 Stiffness of left shoulder, not elsewhere classified: Secondary | ICD-10-CM | POA: Diagnosis not present

## 2019-10-09 DIAGNOSIS — Z7409 Other reduced mobility: Secondary | ICD-10-CM | POA: Diagnosis not present

## 2019-10-09 DIAGNOSIS — Z4789 Encounter for other orthopedic aftercare: Secondary | ICD-10-CM | POA: Diagnosis not present

## 2019-10-09 DIAGNOSIS — R531 Weakness: Secondary | ICD-10-CM | POA: Diagnosis not present

## 2019-10-11 DIAGNOSIS — Z4789 Encounter for other orthopedic aftercare: Secondary | ICD-10-CM | POA: Diagnosis not present

## 2019-10-11 DIAGNOSIS — R531 Weakness: Secondary | ICD-10-CM | POA: Diagnosis not present

## 2019-10-11 DIAGNOSIS — M25612 Stiffness of left shoulder, not elsewhere classified: Secondary | ICD-10-CM | POA: Diagnosis not present

## 2019-10-11 DIAGNOSIS — Z7409 Other reduced mobility: Secondary | ICD-10-CM | POA: Diagnosis not present

## 2019-10-16 DIAGNOSIS — Z4789 Encounter for other orthopedic aftercare: Secondary | ICD-10-CM | POA: Diagnosis not present

## 2019-10-16 DIAGNOSIS — M25612 Stiffness of left shoulder, not elsewhere classified: Secondary | ICD-10-CM | POA: Diagnosis not present

## 2019-10-16 DIAGNOSIS — R531 Weakness: Secondary | ICD-10-CM | POA: Diagnosis not present

## 2019-10-16 DIAGNOSIS — Z7409 Other reduced mobility: Secondary | ICD-10-CM | POA: Diagnosis not present

## 2019-10-18 DIAGNOSIS — Z7409 Other reduced mobility: Secondary | ICD-10-CM | POA: Diagnosis not present

## 2019-10-18 DIAGNOSIS — R531 Weakness: Secondary | ICD-10-CM | POA: Diagnosis not present

## 2019-10-18 DIAGNOSIS — M25612 Stiffness of left shoulder, not elsewhere classified: Secondary | ICD-10-CM | POA: Diagnosis not present

## 2019-10-22 DIAGNOSIS — R112 Nausea with vomiting, unspecified: Secondary | ICD-10-CM | POA: Diagnosis not present

## 2019-10-22 DIAGNOSIS — Z20822 Contact with and (suspected) exposure to covid-19: Secondary | ICD-10-CM | POA: Diagnosis not present

## 2019-10-22 DIAGNOSIS — R519 Headache, unspecified: Secondary | ICD-10-CM | POA: Diagnosis not present

## 2019-10-22 DIAGNOSIS — R197 Diarrhea, unspecified: Secondary | ICD-10-CM | POA: Diagnosis not present

## 2019-10-30 DIAGNOSIS — Z87828 Personal history of other (healed) physical injury and trauma: Secondary | ICD-10-CM | POA: Diagnosis not present

## 2019-10-30 DIAGNOSIS — Z4789 Encounter for other orthopedic aftercare: Secondary | ICD-10-CM | POA: Diagnosis not present

## 2019-10-30 DIAGNOSIS — Z8739 Personal history of other diseases of the musculoskeletal system and connective tissue: Secondary | ICD-10-CM | POA: Diagnosis not present

## 2019-10-30 DIAGNOSIS — Z9889 Other specified postprocedural states: Secondary | ICD-10-CM | POA: Diagnosis not present

## 2019-11-05 DIAGNOSIS — E785 Hyperlipidemia, unspecified: Secondary | ICD-10-CM | POA: Diagnosis not present

## 2019-11-05 DIAGNOSIS — Z8249 Family history of ischemic heart disease and other diseases of the circulatory system: Secondary | ICD-10-CM | POA: Diagnosis not present

## 2019-11-05 DIAGNOSIS — I440 Atrioventricular block, first degree: Secondary | ICD-10-CM | POA: Diagnosis not present

## 2019-11-05 DIAGNOSIS — D509 Iron deficiency anemia, unspecified: Secondary | ICD-10-CM | POA: Diagnosis not present

## 2019-11-05 DIAGNOSIS — Z885 Allergy status to narcotic agent status: Secondary | ICD-10-CM | POA: Diagnosis not present

## 2019-11-05 DIAGNOSIS — I130 Hypertensive heart and chronic kidney disease with heart failure and stage 1 through stage 4 chronic kidney disease, or unspecified chronic kidney disease: Secondary | ICD-10-CM | POA: Diagnosis not present

## 2019-11-05 DIAGNOSIS — Z7982 Long term (current) use of aspirin: Secondary | ICD-10-CM | POA: Diagnosis not present

## 2019-11-05 DIAGNOSIS — D696 Thrombocytopenia, unspecified: Secondary | ICD-10-CM | POA: Diagnosis not present

## 2019-11-05 DIAGNOSIS — Z79899 Other long term (current) drug therapy: Secondary | ICD-10-CM | POA: Diagnosis not present

## 2019-11-05 DIAGNOSIS — Z7902 Long term (current) use of antithrombotics/antiplatelets: Secondary | ICD-10-CM | POA: Diagnosis not present

## 2019-11-05 DIAGNOSIS — E1151 Type 2 diabetes mellitus with diabetic peripheral angiopathy without gangrene: Secondary | ICD-10-CM | POA: Diagnosis not present

## 2019-11-05 DIAGNOSIS — I350 Nonrheumatic aortic (valve) stenosis: Secondary | ICD-10-CM | POA: Diagnosis not present

## 2019-11-05 DIAGNOSIS — K219 Gastro-esophageal reflux disease without esophagitis: Secondary | ICD-10-CM | POA: Diagnosis not present

## 2019-11-05 DIAGNOSIS — I5032 Chronic diastolic (congestive) heart failure: Secondary | ICD-10-CM | POA: Diagnosis not present

## 2019-11-05 DIAGNOSIS — I70219 Atherosclerosis of native arteries of extremities with intermittent claudication, unspecified extremity: Secondary | ICD-10-CM | POA: Diagnosis not present

## 2019-11-05 DIAGNOSIS — E1122 Type 2 diabetes mellitus with diabetic chronic kidney disease: Secondary | ICD-10-CM | POA: Diagnosis not present

## 2019-11-05 DIAGNOSIS — I447 Left bundle-branch block, unspecified: Secondary | ICD-10-CM | POA: Diagnosis not present

## 2019-11-05 DIAGNOSIS — N1831 Chronic kidney disease, stage 3a: Secondary | ICD-10-CM | POA: Diagnosis not present

## 2019-11-12 DIAGNOSIS — E119 Type 2 diabetes mellitus without complications: Secondary | ICD-10-CM | POA: Diagnosis not present

## 2019-11-12 DIAGNOSIS — D696 Thrombocytopenia, unspecified: Secondary | ICD-10-CM | POA: Diagnosis present

## 2019-11-12 DIAGNOSIS — I447 Left bundle-branch block, unspecified: Secondary | ICD-10-CM | POA: Diagnosis not present

## 2019-11-12 DIAGNOSIS — Z006 Encounter for examination for normal comparison and control in clinical research program: Secondary | ICD-10-CM | POA: Diagnosis not present

## 2019-11-12 DIAGNOSIS — E785 Hyperlipidemia, unspecified: Secondary | ICD-10-CM | POA: Diagnosis not present

## 2019-11-12 DIAGNOSIS — Z952 Presence of prosthetic heart valve: Secondary | ICD-10-CM | POA: Diagnosis not present

## 2019-11-12 DIAGNOSIS — Z8249 Family history of ischemic heart disease and other diseases of the circulatory system: Secondary | ICD-10-CM | POA: Diagnosis not present

## 2019-11-12 DIAGNOSIS — E1122 Type 2 diabetes mellitus with diabetic chronic kidney disease: Secondary | ICD-10-CM | POA: Diagnosis present

## 2019-11-12 DIAGNOSIS — I350 Nonrheumatic aortic (valve) stenosis: Secondary | ICD-10-CM | POA: Diagnosis not present

## 2019-11-12 DIAGNOSIS — D509 Iron deficiency anemia, unspecified: Secondary | ICD-10-CM | POA: Diagnosis present

## 2019-11-12 DIAGNOSIS — I10 Essential (primary) hypertension: Secondary | ICD-10-CM | POA: Diagnosis not present

## 2019-11-12 DIAGNOSIS — N1831 Chronic kidney disease, stage 3a: Secondary | ICD-10-CM | POA: Diagnosis present

## 2019-11-12 DIAGNOSIS — R0602 Shortness of breath: Secondary | ICD-10-CM | POA: Diagnosis not present

## 2019-11-12 DIAGNOSIS — Z7902 Long term (current) use of antithrombotics/antiplatelets: Secondary | ICD-10-CM | POA: Diagnosis not present

## 2019-11-12 DIAGNOSIS — I5032 Chronic diastolic (congestive) heart failure: Secondary | ICD-10-CM | POA: Diagnosis present

## 2019-11-12 DIAGNOSIS — I70219 Atherosclerosis of native arteries of extremities with intermittent claudication, unspecified extremity: Secondary | ICD-10-CM | POA: Diagnosis present

## 2019-11-12 DIAGNOSIS — I440 Atrioventricular block, first degree: Secondary | ICD-10-CM | POA: Diagnosis not present

## 2019-11-12 DIAGNOSIS — K219 Gastro-esophageal reflux disease without esophagitis: Secondary | ICD-10-CM | POA: Diagnosis present

## 2019-11-12 DIAGNOSIS — Z885 Allergy status to narcotic agent status: Secondary | ICD-10-CM | POA: Diagnosis not present

## 2019-11-12 DIAGNOSIS — I97190 Other postprocedural cardiac functional disturbances following cardiac surgery: Secondary | ICD-10-CM | POA: Diagnosis not present

## 2019-11-12 DIAGNOSIS — Z7982 Long term (current) use of aspirin: Secondary | ICD-10-CM | POA: Diagnosis not present

## 2019-11-12 DIAGNOSIS — I130 Hypertensive heart and chronic kidney disease with heart failure and stage 1 through stage 4 chronic kidney disease, or unspecified chronic kidney disease: Secondary | ICD-10-CM | POA: Diagnosis present

## 2019-11-12 DIAGNOSIS — E1151 Type 2 diabetes mellitus with diabetic peripheral angiopathy without gangrene: Secondary | ICD-10-CM | POA: Diagnosis present

## 2019-11-12 DIAGNOSIS — Z79899 Other long term (current) drug therapy: Secondary | ICD-10-CM | POA: Diagnosis not present

## 2019-11-14 DIAGNOSIS — I447 Left bundle-branch block, unspecified: Secondary | ICD-10-CM | POA: Diagnosis not present

## 2019-12-12 DIAGNOSIS — I071 Rheumatic tricuspid insufficiency: Secondary | ICD-10-CM | POA: Diagnosis not present

## 2019-12-12 DIAGNOSIS — I371 Nonrheumatic pulmonary valve insufficiency: Secondary | ICD-10-CM | POA: Diagnosis not present

## 2019-12-12 DIAGNOSIS — I350 Nonrheumatic aortic (valve) stenosis: Secondary | ICD-10-CM | POA: Diagnosis not present

## 2019-12-12 DIAGNOSIS — I498 Other specified cardiac arrhythmias: Secondary | ICD-10-CM | POA: Diagnosis not present

## 2019-12-12 DIAGNOSIS — I351 Nonrheumatic aortic (valve) insufficiency: Secondary | ICD-10-CM | POA: Diagnosis not present

## 2019-12-12 DIAGNOSIS — I739 Peripheral vascular disease, unspecified: Secondary | ICD-10-CM | POA: Diagnosis not present

## 2019-12-12 DIAGNOSIS — Z952 Presence of prosthetic heart valve: Secondary | ICD-10-CM | POA: Diagnosis not present

## 2019-12-12 DIAGNOSIS — I340 Nonrheumatic mitral (valve) insufficiency: Secondary | ICD-10-CM | POA: Diagnosis not present

## 2019-12-19 DIAGNOSIS — Z952 Presence of prosthetic heart valve: Secondary | ICD-10-CM | POA: Diagnosis not present

## 2019-12-19 DIAGNOSIS — Z23 Encounter for immunization: Secondary | ICD-10-CM | POA: Diagnosis not present

## 2019-12-20 DIAGNOSIS — Z952 Presence of prosthetic heart valve: Secondary | ICD-10-CM | POA: Diagnosis not present

## 2019-12-21 DIAGNOSIS — Z952 Presence of prosthetic heart valve: Secondary | ICD-10-CM | POA: Diagnosis not present

## 2019-12-24 DIAGNOSIS — I6529 Occlusion and stenosis of unspecified carotid artery: Secondary | ICD-10-CM | POA: Diagnosis not present

## 2019-12-24 DIAGNOSIS — I2510 Atherosclerotic heart disease of native coronary artery without angina pectoris: Secondary | ICD-10-CM | POA: Diagnosis not present

## 2019-12-24 DIAGNOSIS — Z952 Presence of prosthetic heart valve: Secondary | ICD-10-CM | POA: Diagnosis not present

## 2019-12-24 DIAGNOSIS — Z01810 Encounter for preprocedural cardiovascular examination: Secondary | ICD-10-CM | POA: Diagnosis not present

## 2019-12-24 DIAGNOSIS — I739 Peripheral vascular disease, unspecified: Secondary | ICD-10-CM | POA: Diagnosis not present

## 2019-12-24 DIAGNOSIS — E119 Type 2 diabetes mellitus without complications: Secondary | ICD-10-CM | POA: Diagnosis not present

## 2019-12-24 DIAGNOSIS — I471 Supraventricular tachycardia: Secondary | ICD-10-CM | POA: Diagnosis not present

## 2019-12-28 DIAGNOSIS — Z952 Presence of prosthetic heart valve: Secondary | ICD-10-CM | POA: Diagnosis not present

## 2020-01-01 DIAGNOSIS — Z952 Presence of prosthetic heart valve: Secondary | ICD-10-CM | POA: Diagnosis not present

## 2020-01-03 DIAGNOSIS — Z952 Presence of prosthetic heart valve: Secondary | ICD-10-CM | POA: Diagnosis not present

## 2020-01-04 DIAGNOSIS — Z952 Presence of prosthetic heart valve: Secondary | ICD-10-CM | POA: Diagnosis not present

## 2020-01-08 DIAGNOSIS — Z952 Presence of prosthetic heart valve: Secondary | ICD-10-CM | POA: Diagnosis not present

## 2020-01-15 DIAGNOSIS — Z952 Presence of prosthetic heart valve: Secondary | ICD-10-CM | POA: Diagnosis not present

## 2020-01-17 DIAGNOSIS — Z952 Presence of prosthetic heart valve: Secondary | ICD-10-CM | POA: Diagnosis not present

## 2020-01-18 DIAGNOSIS — I471 Supraventricular tachycardia: Secondary | ICD-10-CM | POA: Diagnosis not present

## 2020-01-18 DIAGNOSIS — I493 Ventricular premature depolarization: Secondary | ICD-10-CM | POA: Diagnosis not present

## 2020-01-18 DIAGNOSIS — Z954 Presence of other heart-valve replacement: Secondary | ICD-10-CM | POA: Diagnosis not present

## 2020-01-18 DIAGNOSIS — R9431 Abnormal electrocardiogram [ECG] [EKG]: Secondary | ICD-10-CM | POA: Diagnosis not present

## 2020-01-18 DIAGNOSIS — Z952 Presence of prosthetic heart valve: Secondary | ICD-10-CM | POA: Diagnosis not present

## 2020-01-18 DIAGNOSIS — Z79899 Other long term (current) drug therapy: Secondary | ICD-10-CM | POA: Diagnosis not present

## 2020-01-21 DIAGNOSIS — R9431 Abnormal electrocardiogram [ECG] [EKG]: Secondary | ICD-10-CM | POA: Diagnosis not present

## 2020-01-22 DIAGNOSIS — Z952 Presence of prosthetic heart valve: Secondary | ICD-10-CM | POA: Diagnosis not present

## 2020-01-24 DIAGNOSIS — Z952 Presence of prosthetic heart valve: Secondary | ICD-10-CM | POA: Diagnosis not present

## 2020-01-25 DIAGNOSIS — Z952 Presence of prosthetic heart valve: Secondary | ICD-10-CM | POA: Diagnosis not present

## 2020-01-29 DIAGNOSIS — Z952 Presence of prosthetic heart valve: Secondary | ICD-10-CM | POA: Diagnosis not present

## 2020-01-31 DIAGNOSIS — Z952 Presence of prosthetic heart valve: Secondary | ICD-10-CM | POA: Diagnosis not present

## 2020-02-01 DIAGNOSIS — Z9889 Other specified postprocedural states: Secondary | ICD-10-CM | POA: Diagnosis not present

## 2020-02-01 DIAGNOSIS — M12811 Other specific arthropathies, not elsewhere classified, right shoulder: Secondary | ICD-10-CM | POA: Diagnosis not present

## 2020-02-01 DIAGNOSIS — M25511 Pain in right shoulder: Secondary | ICD-10-CM | POA: Diagnosis not present

## 2020-02-01 DIAGNOSIS — M75101 Unspecified rotator cuff tear or rupture of right shoulder, not specified as traumatic: Secondary | ICD-10-CM | POA: Diagnosis not present

## 2020-02-01 DIAGNOSIS — G8929 Other chronic pain: Secondary | ICD-10-CM | POA: Diagnosis not present

## 2020-02-12 DIAGNOSIS — Z952 Presence of prosthetic heart valve: Secondary | ICD-10-CM | POA: Diagnosis not present

## 2020-02-14 DIAGNOSIS — Z952 Presence of prosthetic heart valve: Secondary | ICD-10-CM | POA: Diagnosis not present

## 2020-05-27 ENCOUNTER — Telehealth: Payer: Self-pay | Admitting: *Deleted

## 2020-06-02 NOTE — Telephone Encounter (Signed)
Entered in error
